# Patient Record
Sex: Female | Born: 1965
Health system: Southern US, Community
[De-identification: ages and names within clinical notes are randomized; demographics above are authoritative.]

## PROBLEM LIST (undated history)

## (undated) DIAGNOSIS — Z87442 Personal history of urinary calculi: Secondary | ICD-10-CM

## (undated) DIAGNOSIS — M1990 Unspecified osteoarthritis, unspecified site: Secondary | ICD-10-CM

## (undated) DIAGNOSIS — J189 Pneumonia, unspecified organism: Secondary | ICD-10-CM

## (undated) DIAGNOSIS — I4891 Unspecified atrial fibrillation: Secondary | ICD-10-CM

## (undated) DIAGNOSIS — F329 Major depressive disorder, single episode, unspecified: Secondary | ICD-10-CM

## (undated) DIAGNOSIS — R011 Cardiac murmur, unspecified: Principal | ICD-10-CM

## (undated) DIAGNOSIS — F419 Anxiety disorder, unspecified: Secondary | ICD-10-CM

## (undated) DIAGNOSIS — I10 Essential (primary) hypertension: Secondary | ICD-10-CM

## (undated) DIAGNOSIS — G4730 Sleep apnea, unspecified: Secondary | ICD-10-CM

## (undated) DIAGNOSIS — F32A Depression, unspecified: Secondary | ICD-10-CM

## (undated) DIAGNOSIS — R51 Headache: Secondary | ICD-10-CM

## (undated) DIAGNOSIS — Z6841 Body Mass Index (BMI) 40.0 and over, adult: Secondary | ICD-10-CM

## (undated) DIAGNOSIS — Z8719 Personal history of other diseases of the digestive system: Secondary | ICD-10-CM

## (undated) DIAGNOSIS — R519 Headache, unspecified: Secondary | ICD-10-CM

## (undated) DIAGNOSIS — D649 Anemia, unspecified: Secondary | ICD-10-CM

## (undated) HISTORY — DX: Body Mass Index (BMI) 40.0 and over, adult: Z684

## (undated) HISTORY — DX: Cardiac murmur, unspecified: R01.1

## (undated) HISTORY — PX: TONSILLECTOMY: SUR1361

## (undated) HISTORY — DX: Unspecified atrial fibrillation: I48.91

## (undated) HISTORY — DX: Morbid (severe) obesity due to excess calories: E66.01

## (undated) HISTORY — PX: CHOLECYSTECTOMY: SHX55

## (undated) HISTORY — DX: Essential (primary) hypertension: I10

## (undated) HISTORY — PX: APPENDECTOMY: SHX54

## (undated) HISTORY — DX: Pneumonia, unspecified organism: J18.9

---

## 1998-05-21 ENCOUNTER — Other Ambulatory Visit: Admission: RE | Admit: 1998-05-21 | Discharge: 1998-05-21 | Payer: Self-pay | Admitting: Obstetrics and Gynecology

## 1998-10-04 ENCOUNTER — Emergency Department (HOSPITAL_COMMUNITY): Admission: EM | Admit: 1998-10-04 | Discharge: 1998-10-04 | Payer: Self-pay | Admitting: Internal Medicine

## 1999-12-06 ENCOUNTER — Other Ambulatory Visit: Admission: RE | Admit: 1999-12-06 | Discharge: 1999-12-06 | Payer: Self-pay | Admitting: Gynecology

## 1999-12-06 ENCOUNTER — Encounter (INDEPENDENT_AMBULATORY_CARE_PROVIDER_SITE_OTHER): Payer: Self-pay | Admitting: Specialist

## 2002-03-27 ENCOUNTER — Other Ambulatory Visit: Admission: RE | Admit: 2002-03-27 | Discharge: 2002-03-27 | Payer: Self-pay | Admitting: Obstetrics and Gynecology

## 2002-04-07 ENCOUNTER — Encounter (INDEPENDENT_AMBULATORY_CARE_PROVIDER_SITE_OTHER): Payer: Self-pay | Admitting: Specialist

## 2002-04-07 ENCOUNTER — Ambulatory Visit (HOSPITAL_COMMUNITY): Admission: AD | Admit: 2002-04-07 | Discharge: 2002-04-07 | Payer: Self-pay | Admitting: Obstetrics and Gynecology

## 2002-04-07 ENCOUNTER — Encounter: Payer: Self-pay | Admitting: Obstetrics and Gynecology

## 2002-04-09 ENCOUNTER — Encounter: Payer: Self-pay | Admitting: Obstetrics and Gynecology

## 2002-04-09 ENCOUNTER — Observation Stay (HOSPITAL_COMMUNITY): Admission: AD | Admit: 2002-04-09 | Discharge: 2002-04-10 | Payer: Self-pay | Admitting: Obstetrics and Gynecology

## 2002-04-09 ENCOUNTER — Encounter (INDEPENDENT_AMBULATORY_CARE_PROVIDER_SITE_OTHER): Payer: Self-pay | Admitting: Specialist

## 2002-04-26 ENCOUNTER — Ambulatory Visit (HOSPITAL_COMMUNITY): Admission: RE | Admit: 2002-04-26 | Discharge: 2002-04-26 | Payer: Self-pay | Admitting: Family Medicine

## 2002-04-26 ENCOUNTER — Encounter: Payer: Self-pay | Admitting: Family Medicine

## 2002-05-22 ENCOUNTER — Encounter: Payer: Self-pay | Admitting: Surgery

## 2002-05-22 ENCOUNTER — Observation Stay (HOSPITAL_COMMUNITY): Admission: EM | Admit: 2002-05-22 | Discharge: 2002-05-23 | Payer: Self-pay | Admitting: Emergency Medicine

## 2002-05-22 ENCOUNTER — Encounter (INDEPENDENT_AMBULATORY_CARE_PROVIDER_SITE_OTHER): Payer: Self-pay | Admitting: Specialist

## 2003-01-22 ENCOUNTER — Ambulatory Visit (HOSPITAL_COMMUNITY): Admission: RE | Admit: 2003-01-22 | Discharge: 2003-01-22 | Payer: Self-pay | Admitting: Obstetrics and Gynecology

## 2003-02-17 ENCOUNTER — Ambulatory Visit (HOSPITAL_COMMUNITY): Admission: RE | Admit: 2003-02-17 | Discharge: 2003-02-17 | Payer: Self-pay | Admitting: Obstetrics and Gynecology

## 2003-02-17 ENCOUNTER — Encounter: Payer: Self-pay | Admitting: Obstetrics and Gynecology

## 2003-02-28 ENCOUNTER — Encounter: Payer: Self-pay | Admitting: Obstetrics and Gynecology

## 2003-02-28 ENCOUNTER — Inpatient Hospital Stay (HOSPITAL_COMMUNITY): Admission: AD | Admit: 2003-02-28 | Discharge: 2003-03-01 | Payer: Self-pay | Admitting: Obstetrics and Gynecology

## 2003-02-28 ENCOUNTER — Encounter (INDEPENDENT_AMBULATORY_CARE_PROVIDER_SITE_OTHER): Payer: Self-pay

## 2003-03-02 ENCOUNTER — Inpatient Hospital Stay (HOSPITAL_COMMUNITY): Admission: AD | Admit: 2003-03-02 | Discharge: 2003-03-02 | Payer: Self-pay | Admitting: Obstetrics and Gynecology

## 2003-03-13 ENCOUNTER — Encounter: Admission: RE | Admit: 2003-03-13 | Discharge: 2003-06-11 | Payer: Self-pay | Admitting: Obstetrics and Gynecology

## 2003-03-14 ENCOUNTER — Ambulatory Visit (HOSPITAL_COMMUNITY): Admission: RE | Admit: 2003-03-14 | Discharge: 2003-03-14 | Payer: Self-pay | Admitting: Obstetrics and Gynecology

## 2003-03-14 ENCOUNTER — Encounter: Payer: Self-pay | Admitting: Obstetrics and Gynecology

## 2003-04-17 ENCOUNTER — Ambulatory Visit (HOSPITAL_COMMUNITY): Admission: RE | Admit: 2003-04-17 | Discharge: 2003-04-17 | Payer: Self-pay | Admitting: Obstetrics and Gynecology

## 2003-04-17 ENCOUNTER — Encounter: Payer: Self-pay | Admitting: Obstetrics and Gynecology

## 2003-07-22 ENCOUNTER — Inpatient Hospital Stay (HOSPITAL_COMMUNITY): Admission: AD | Admit: 2003-07-22 | Discharge: 2003-07-22 | Payer: Self-pay | Admitting: Obstetrics and Gynecology

## 2003-08-04 ENCOUNTER — Inpatient Hospital Stay (HOSPITAL_COMMUNITY): Admission: AD | Admit: 2003-08-04 | Discharge: 2003-08-04 | Payer: Self-pay | Admitting: Obstetrics and Gynecology

## 2003-08-06 ENCOUNTER — Inpatient Hospital Stay (HOSPITAL_COMMUNITY): Admission: RE | Admit: 2003-08-06 | Discharge: 2003-08-09 | Payer: Self-pay | Admitting: Obstetrics and Gynecology

## 2003-08-06 ENCOUNTER — Encounter (INDEPENDENT_AMBULATORY_CARE_PROVIDER_SITE_OTHER): Payer: Self-pay | Admitting: Specialist

## 2004-03-30 ENCOUNTER — Other Ambulatory Visit: Admission: RE | Admit: 2004-03-30 | Discharge: 2004-03-30 | Payer: Self-pay | Admitting: Obstetrics and Gynecology

## 2005-04-05 ENCOUNTER — Other Ambulatory Visit: Admission: RE | Admit: 2005-04-05 | Discharge: 2005-04-05 | Payer: Self-pay | Admitting: Obstetrics and Gynecology

## 2006-04-11 ENCOUNTER — Other Ambulatory Visit: Admission: RE | Admit: 2006-04-11 | Discharge: 2006-04-11 | Payer: Self-pay | Admitting: Obstetrics and Gynecology

## 2006-05-16 ENCOUNTER — Ambulatory Visit (HOSPITAL_COMMUNITY): Admission: RE | Admit: 2006-05-16 | Discharge: 2006-05-16 | Payer: Self-pay | Admitting: Obstetrics and Gynecology

## 2006-05-30 ENCOUNTER — Encounter: Admission: RE | Admit: 2006-05-30 | Discharge: 2006-05-30 | Payer: Self-pay | Admitting: Obstetrics and Gynecology

## 2007-05-22 ENCOUNTER — Ambulatory Visit (HOSPITAL_COMMUNITY): Admission: RE | Admit: 2007-05-22 | Discharge: 2007-05-22 | Payer: Self-pay | Admitting: Obstetrics and Gynecology

## 2007-10-03 ENCOUNTER — Encounter: Admission: RE | Admit: 2007-10-03 | Discharge: 2007-10-03 | Payer: Self-pay | Admitting: Obstetrics and Gynecology

## 2008-05-22 ENCOUNTER — Encounter: Admission: RE | Admit: 2008-05-22 | Discharge: 2008-05-22 | Payer: Self-pay | Admitting: Obstetrics and Gynecology

## 2009-06-08 ENCOUNTER — Encounter: Admission: RE | Admit: 2009-06-08 | Discharge: 2009-06-08 | Payer: Self-pay | Admitting: Obstetrics and Gynecology

## 2010-06-28 ENCOUNTER — Encounter: Admission: RE | Admit: 2010-06-28 | Discharge: 2010-06-28 | Payer: Self-pay | Admitting: Obstetrics and Gynecology

## 2011-04-15 NOTE — H&P (Signed)
[facility]  PatientLING, FLESCH Visit Number: [id number] MRN: [id number]          Service Type: Attending:  Naima A. Normand Sloop, M.D. Dictated by:   Pierre Bali. Normand Sloop, M.D. Adm. Date:  04/09/02                           History and Physical  HISTORY OF PRESENT ILLNESS:   Ms. Verne is a 45 year old, G1, P0-0-0-1-0, who had a spontaneous abortion at 60 weeks on Apr 07, 2002.  She had a D&E for retained products, but despite D&E, she presented today complaining of heavy vaginal bleeding with passing clots since about 6 p.m.  Also had some dizziness.  Denied any chest pain or shortness of breath.  She said she has just been passing clots since that time.  PAST OBSTETRICAL HISTORY:     As above.  PAST MEDICAL HISTORY:         Significant for polycystic ovarian disease and depression.  PAST GYNECOLOGIC HISTORY:     She has no history of abnormal Pap smears or sexually transmitted diseases, and as above.  ALLERGIES:                    She is allergic to PENICILLIN.  MEDICATIONS:                  Motrin, Glucophage, and Prozac.  SOCIAL HISTORY:               Negative x 3.  PHYSICAL EXAMINATION:  VITAL SIGNS:                  Temperature 99.1, pulse ranges from 61 to 74, respiratory rate of 20.  Two blood pressures were taken which were 161/76 and 170/69.  GENERAL:                      The patient was in mild distress secondary to cramping.  HEART:                        Regular.  LUNGS:                        Clear.  ABDOMEN:                      Soft and nontender.  PELVIC:                       Vulva and vaginal exam is within normal limits. Cervix is 1 cm, dilated with retained products of the os, which was removed with ring forceps.  The uterus is about 10 weeks size, moderately tender with no adnexal masses.  EXTREMITIES:                  No clubbing, cyanosis, or edema.  LABORATORY DATA:              On ultrasound, the uterus  measures 12.67 by 6.97 by 6 cm.  Endometrial stripe at the largest area is 2.9 cm in thickness which was consistent with retained products or a blood clot.  White count is high at 24.1 and hemoglobin is 11.5.  Platelets are 358,000.  ASSESSMENT:  The patient is an incomplete abortion, status post dilatation and evacuation with still some retained products of conception.  After the products of conception were removed from the os, the bleeding slowed and the patients pain improved and cramping improved. Because a hemoglobin is stable and vital signs are stable, the patient was given the option of serial H&H with a pack count versus a repeat D&E under ultrasound guidance.  The patient chose observation with Methergine and Motrin.  She says if the bleeding becomes heavy again then we will proceed with the D&E.  I will recheck H&H q.4h., Start doxycycline, and monitor the patient closely.Dictated by:   Pierre Bali. Normand Sloop, M.D. Attending:  Naima A. Dillard, M.D. DD:  04/09/02 TD:  04/09/02 Job: [id number]

## 2011-04-15 NOTE — Op Note (Signed)
[facility]  PatientNIKKY, [name] Visit Number: [id number] MRN: [id number]          Service Type: MED Location: [phone] [id number] Attending Physician:  Jaymes Graff A Dictated by:   Pierre Bali Normand Sloop, M.D. Proc. Date: 04/07/02 Admit Date:  04/09/2002 Discharge Date: 04/10/2002                             Operative Report  PREOPERATIVE DIAGNOSIS:       Incomplete abortion at 13 weeks.  POSTOPERATIVE DIAGNOSIS:      Incomplete abortion at 13 weeks.  OPERATION:                    Dilation and evacuation.  SURGEON:                      Naima A. Normand Sloop, M.D.  ANESTHESIA:                   MAC.  ESTIMATED BLOOD LOSS:         450 cc.  IV FLUIDS:                    1200 cc of crystalloid.  FINDINGS:                     A 10 week size uterus, anteverted, and no                               adnexal masses palpated.  COMPLICATIONS:                None.  DISPOSITION:                  The patient went to the recovery room in                               good condition.  DESCRIPTION OF PROCEDURE:     The patient was taken to the operating room after giving informed consent, and she was placed in the dorsolithotomy position, and prepped and draped in a normal sterile fashion.  The patient was examined and noted to have the findings noted above.  A bivalve speculum was placed into the vagina.  The anterior lip of the cervix was grasped with a single tooth tenaculum.  The cervix was noted to be fingertip dilated and dilated a little farther with the [facility] dilators up to 25.  A size 8 suction curettage was then placed into the uterus until a gritty texture was noted and a scant amount of what appears to be products of conception were obtained. The sharp curet was then placed in the uterus until a gritty texture was noted.  The curet was then removed from the vagina and a gritty texture was noted.  The patient began to have heavy vaginal bleeding.   The curet was then placed back into the uterus, and again, a sharp gritty texture was noted on all walls of the uterus. At this time, the patient was then given uterine massage, Pitocin, and one dose of Methergine.  Estimated blood loss of 450 cc.  The patient did begin to slow down.  Bleeding began to slow down and then come to a halt.  The  patient will be sent home on Methergine and Motrin with bleeding precautions.  Her vital signs remained stable and she did well. All instruments were removed from the vagina.  Sponge, lap, and instrument counts were correct x2.  The patient went to the recovery room in stable condition. Dictated by:   Pierre Bali. Normand Sloop, M.D. Attending Physician:  Michael Litter DD:  04/07/02 TD:  04/09/02 Job: [id number]

## 2011-04-15 NOTE — Discharge Summary (Signed)
   [name], [name]                         ACCOUNT NO.:  [id number]   MEDICAL RECORD NO.:  [id number]                   PATIENT TYPE:  INP   LOCATION:  9114                                 FACILITY:  [facility]   PHYSICIAN:  Hal Morales, M.D.             DATE OF BIRTH:  [birth year]   DATE OF ADMISSION:  08/06/2003  DATE OF DISCHARGE:  08/09/2003                                 DISCHARGE SUMMARY   ADMISSION DIAGNOSES:  1. Intrauterine pregnancy at term.  2. Macrosomia.   DISCHARGE DIAGNOSES:  1. Intrauterine pregnancy at term status post cesarean delivery of a female     infant, Apgars 9 and 9, weighing 11 pounds 8 ounces.  2. Macrosomia.  3. Meconium-stained fluid.  4. True knot in the umbilical cord.   HOSPITAL PROCEDURES:  1. Spinal anesthesia.  2. Primary low transverse cesarean section.   HOSPITAL COURSE:  The patient was admitted for an elective primary low  transverse cesarean section due to fetal macrosomia.  This was performed  under spinal anesthesia by Naima A. Dillard, M.D. without incident.  Female  infant was delivered weighing 11 pounds 8 ounces, Apgars 9 and 9.  There was  moderate meconium-stained fluid noted and a true knot in the cord was noted  although the baby did very well.  Postop day #1 the patient was doing well,  breast feeding her baby and her vital signs were stable.  Hemoglobin was  9.3.  She continued her recovery over the next two days and did well.  Third  postop day she was ready to go home.  The pain was well controlled.  She was  breast feeding successfully.  Her vital signs were stable she was afebrile.  Chest was clear, heart rate was regular rate and rhythm, abdomen was soft  and appropriately tender.  Incision was clean, dry, and intact.  JP was  draining scant amount of serosanguineous fluid.  Lochia was small.  Extremities within normal limits and she was deemed to have received the  full benefit of her hospital stay and was  discharged home.   DISCHARGE MEDICATIONS:  1. Tylox one to two p.o. q.4 h. p.r.n.  2. Motrin 600 mg p.o. q.6 h. p.r.n.  3. Prenatal vitamins.   DISCHARGE LABORATORY DATA:  White blood cell count 7.1, hemoglobin 10.4,  hematocrit 29.7, platelet count 155.    DISCHARGE INSTRUCTIONS:  Per [facility] handout.   DISCHARGE FOLLOW UP:  In six weeks or p.r.n.     Marie L. Williams, C.N.M.                 Hal Morales, M.D.    MLW/MEDQ  D:  08/09/2003  T:  08/09/2003  Job:  [id number]

## 2011-04-15 NOTE — Discharge Summary (Signed)
[facility]  PatientCASSI, [name] Visit Number: [id number] MRN: [id number]          Service Type: MED Location: [phone] Attending Physician:  Jaymes Graff A Dictated by:   Mack Guise, C.N.M. Admit Date:  04/09/2002 Discharge Date: 04/10/2002                             Discharge Summary  ADMITTING DIAGNOSIS:          First trimester incomplete abortion, status post dilation and evacuation with retained products of conception.  PROCEDURE:                    Dilation and evacuation.  DISCHARGE DIAGNOSIS:          First trimester incomplete abortion, status post dilation and evacuation with retained products of conception.  HISTORY OF PRESENT ILLNESS:   [name] is a 45 year old gravida 1, para 0-0-1-0 status post spontaneous AB at 13 weeks with D&E on Apr 07, 2002 who presented with complaint of heavy vaginal bleeding and passing clots.  The uterus was 12.67 x 6.97 x 6 cm on ultrasound, endometrial stripe 2.97 at largest area of thickness, consistent with retained products of conception or blood clot.  Hemoglobin 11.5.  D&E performed by Dr. Jaymes Graff with products of conception removed from os.  The patient has been stable since procedure.  Hemoglobin today is 9.5.  Her abdomen is soft and nontender.  Her lochia is small and she is in stable condition and satisfactory for discharge.  DISCHARGE MEDICATIONS:        1. Motrin 600 mg p.o. q.6h. p.r.n. pain.                               2. Iron daily.  FOLLOWUP:                     The patient will return to [facility] in two weeks with followup with Dr. Normand Sloop. Dictated by:   Mack Guise, C.N.M. Attending Physician:  Michael Litter DD:  04/10/02 TD:  04/13/02 Job: [id number]

## 2011-04-15 NOTE — H&P (Signed)
NAME:  [name], [name]                         ACCOUNT NO.:  [id number]   MEDICAL RECORD NO.:  [id number]                   PATIENT TYPE:  AMB   LOCATION:  SDC                                  FACILITY:  [facility]   PHYSICIAN:  Naima A. Dillard, M.D.              DATE OF BIRTH:  Feb 01, 1966   DATE OF ADMISSION:  DATE OF DISCHARGE:                                HISTORY & PHYSICAL   CHIEF COMPLAINT:  Incompetent cervix.   HISTORY AND PHYSICAL:  The patient is a 45 year old gravida 2, para 0, 0, 1,  0 who is 11-1/7 weeks and who is presenting for cerclage secondary to a  history of a 13-week loss in May 2003.  The patient had a 13-week  miscarriage in which the back just appeared between her legs.  The patient  did not have any cramping or bleeding preceding the miscarriage.  The  patient had an ultrasound at six weeks of pregnancy, which demonstrated a  cervical length at 4.5 cm, intrauterine pregnancy, with fetal heart tones of  139 and no gross abnormalities.  She had a repeat ultrasound, which showed a  cervical length of 3.7 cm.  Because of the patient's history risks and  benefits of cerclage were reviewed, and the patient has opted for cerclage.   The patient's pregnancy is complicated by:  1. Advanced maternal age, she is undecided whether she will have an     amniocentesis.  2. Thirteen-week pregnancy loss; remnants were sent for chromosomes and they     were normal.  3. Polycystic ovarian syndrome.  The patient has lost 70 pounds using [employer] and is currently on Glucophage.  4. The patient has intramenstrual bleeding she is Rh positive and probably     due to cervical friability, but the patient also had a small subchorionic     hemorrhage seen on first trimester ultrasound.   PAST MEDICAL HISTORY:  Past medical history is significant for obsessive  compulsive disorder and PCOS.   PAST SURGICAL HISTORY:  Past surgical history is significant for:  1. Appendectomy  in 1989.  2. Tonsillectomy as a child.  3. Breast biopsy.  4. Wisdom teeth removal.  5. Cholecystectomy in June 2003.   PAST OBSTETRICAL HISTORY:  Past Ob history is significant for SAB at 13  weeks and she had a D&E in May 2003.   PAST GYNECOLOGICAL HISTORY:  The patient had menarche at age 61, occurring  every 80 and last 7 days.  No history of abnormal Pap smears or sexually  transmitted diseases.  The patient does have PCOS as above.   MEDICATIONS:  Meds include Glucophage, Prozac and prenatal vitamins.   ALLERGIES:  The patient is allergic to PENICILLIN.   FAMILY HISTORY:  Family history is significant for maternal grandfather who  had a myocardial infarction.  Both parents have chronic hypertension.  Maternal grandmother  has chronic hypertension.   GENETIC HISTORY:  Genetic history is unremarkable.  Father of the baby is  adopted.   SOCIAL HISTORY:  The social history is negative times three.   PHYSICAL EXAMINATION:  GENERAL:  On physical exam the patient is in no  apparent distress.  VITAL SIGNS:  Weight is 275 pounds.  Fetal heart tones are 160 on  ultrasound.  Blood pressure is 120/80.  HEART:  Regular rate and rhythm.  LUNGS:  Clear to auscultation bilaterally.  ABDOMEN:  Soft and nontender.  PELVIC EXAMINATION:  Cervix is closed and the measurements are 7 cm on  vaginal ultrasound.  EXTREMITIES:  The extremities have no cyanosis, clubbing or edema.   ASSESSMENT:  Presumed cervical incompetency.   PLAN:  A McDonald cerclage.   The patient understands that the risks are, but not limited to bleeding,  infection, damage to internal organs such as bladder and rectum, also  pregnancy and avulsion of the cervix.  The patient also understands that  cervical cerclage is very controversial in the Ob community and there are  some studies show that it does not help to maintain pregnancy; she still  desires to proceed with cervical cerclage.                                                 Naima A. Normand Sloop, M.D.    NAD/MEDQ  D:  01/21/2003  T:  01/22/2003  Job:  [id number]

## 2011-04-15 NOTE — Op Note (Signed)
NAME:  [name], [name]                         ACCOUNT NO.:  [id number]   MEDICAL RECORD NO.:  [id number]                   PATIENT TYPE:  INP   LOCATION:  9114                                 FACILITY:  [facility]   PHYSICIAN:  Naima A. Dillard, M.D.              DATE OF BIRTH:  [birth year]   DATE OF PROCEDURE:  08/06/2003  DATE OF DISCHARGE:                                 OPERATIVE REPORT   PREOPERATIVE DIAGNOSIS:  Intrauterine pregnancy at term with macrosomia.   POSTOPERATIVE DIAGNOSIS:  Intrauterine pregnancy at term with macrosomia.   PROCEDURE:  Primary low transverse cesarean section.   SURGEON:  Naima A. Normand Sloop, M.D.   ASSISTANT:  Rica Koyanagi, C.N.M.   ANESTHESIA:  Spinal.   ESTIMATED BLOOD LOSS:  900 mL.   URINE OUTPUT:  200 mL clear urine at the end of the procedure.   FLUIDS REPLACED:  3700 mL crystalloid.   COMPLICATIONS:  None.   FINDINGS:  A female infant in vertex presentation with Apgars of 9 and 9,  weight of 11 pounds 8 ounces, born at 12:54.  She had moderate meconium.  There was a true knot noted in the cord.  The patient had normal-appearing  uterus, tubes, and ovaries bilaterally.   PROCEDURE IN DETAIL:  The patient was taken to the operating room, where she  was given spinal anesthesia, placed in the dorsal supine position with a  left lateral tilt.  When anesthesia was found to be adequate, a Pfannenstiel  skin incision was then made with a scalpel, carried down to the fascia using  Bovie cautery.  The fascia was incised in the midline and extended  bilaterally using Bovie cautery.  Kochers x2 were placed on the superior  aspect of the fascia, which was dissected off the rectus muscle both sharply  and bluntly.  The inferior aspect of the fascia was dissected off the rectus  muscle in a similar fashion.  The rectus muscles were then separated in the  midline.  The peritoneum was identified, tented up, and entered sharply.  Metzenbaum scissors  were used to extend the peritoneum superiorly and  inferiorly with good visualization of bowel and bladder.  The vesicouterine  peritoneum was identified, entered sharply, and the bladder flap was created  both sharply and bluntly.  A bladder blade was inserted.  A primary lower  transverse uterine incision was then made with a scalpel.  The incision was  extended bilaterally using bandage scissors.  There was noted to be moderate  meconium.  The infant was delivered without difficulty, the cord was clamped  and cut, and handed off to awaiting pediatrician.  The placenta delivered  intact with moderate meconium staining and a true knot in the cord.  There  were some placental membranes adherent to the uterus, which had to be  curetted with a large curette.  The uterus was then cleared of all  clot and  debris.  The uterine incision was repaired with 0 Vicryl in a running locked  fashion.  A second layer of imbrication with 0 Vicryl was used to obtain  hemostasis.  The abdomen was irrigated.  Tubes and ovaries were noted to be  normal.  All areas were noted to be hemostatic.  The peritoneum was  reapproximated using 0 chromic, the fascia was closed using 0 Vicryl in a  running fashion.  A JP drain was placed in the subcutaneous layer after  hemostasis was obtained with Bovie cautery.  The subcutaneous tissue was  reapproximated using 2-0 plain.  The skin was closed with staples.  Sponge,  lap, and needle counts were correct x2.  The patient went to the recovery  room in stable condition.                                               Naima A. Normand Sloop, M.D.    NAD/MEDQ  D:  08/06/2003  T:  08/07/2003  Job:  [id number]

## 2011-04-15 NOTE — Op Note (Signed)
[facility]  Patient:    [name], [name] Visit Number: [id number] MRN: [id number]          Service Type: MED Location: [phone] Attending Physician:  Andre Lefort Dictated by:   Sandria Bales. Ezzard Standing, M.D. Proc. Date: 05/22/02 Admit Date:  05/22/2002 Discharge Date: 05/23/2002   CC:         Celso Amy, [facility] A. Eliezer Lofts., M.D.  Naima A. Normand Sloop, M.D.   Operative Report  PREOPERATIVE DIAGNOSES:  Acute cholecystitis, cholelithiasis.  POSTOPERATIVE DIAGNOSES:  Cholelithiasis without evidence of discreet acute disease.  PROCEDURE:  Laparoscopic cholecystectomy with intraoperative cholangiogram.  SURGEON:  Sandria Bales. Ezzard Standing, M.D.  FIRST ASSISTANT:  Zigmund Daniel, M.D.  ANESTHESIA:  General endotracheal.  ESTIMATED BLOOD LOSS:  Minimal.  INDICATIONS FOR PROCEDURE:  [name] is a 45 year old white female whose had recurrent bouts of epigastric right upper quadrant pain whose presented with a history of a fever, elevated liver function, elevated white blood count and documented gallstones on ultrasound and possibly acute cholecystitis. We talked about the options for elective surgery versus the procedure today and she elected to go ahead today and discuss both the indications and potential complications with the patient.  DESCRIPTION OF PROCEDURE:  The patient placed in the supine position. She was given 1 gm of Ancef at the initiation of the procedure. She had PAS stockings in place and oral gastric tube in place. Her abdomen was prepped with Betadine solution and sterilely draped.  An infraumbilical incision was made with sharp dissection and carried down to the abdominal cavity. A zero degree 10 mm laparoscope was inserted through a 12 mm Hasson trocar, the Hasson trocar was secured with a #0 Vicryl suture. Abdominal exploration carried out and revealed the right and left lobes of the liver appeared  normal. The stomach appeared normal and somewhat dilated. The gallbladder was seen in the right upper quadrant. The bowel which could be seen was all normal. She had some adhesions to the right lower quadrant consistent with her prior appendectomy but I saw no other mass, lesion or inflammatory changes within her abdomen. Three additional trocars were placed and a 10 mm Ethicon trocar in the subxiphoid location, a 5 mm Ethicon trocar in the right mid subcostal location, and 5 mm Ethicon trocar in the right lateral subcostal location.  The gallbladder was grasped, rotated cephalad and really had minimal inflammatory changes around it and certainly did not appear to be acutely inflamed. Dissection was carried out around the gallbladder cystic duct junction at the infundibulum. An intraoperative cholangiogram was obtained.  The intraoperative cholangiogram was obtained with a cut off taut catheter inserted through a 14 gauge Jelco catheter and the cut off taut catheter was inserted inside of the cut cystic duct. A clip had been placed on the gallbladder side of the cystic duct. The cystic duct had been milked back because there was some stone and debris in the cystic duct when I first got in there but was able to milk this back into the gallbladder. There was on debris after I cut the duct to place the catheter in. The intraoperative cholangiogram was obtained with the fluoroscopy using about 6-8 cc of 1/2 strength Hypaque solution which showed free flow down a cystic duct into the common bile duct into the duodenum. The common bile duct was moderately dilated but had no filling defect, no mass, no obstruction and was felt to be a normal intraoperative  cholangiogram and could be consistent with her passing maybe some small stone or debris.  The taut catheter was then cut and then removed. The cystic duct was triply endoclipped and divided. The gallbladder was then sharply and  bluntly dissected from the gallbladder bed. Prior to complete division of the gallbladder from the gallbladder, I revisualized the triangle of Calot, I revisualized the gallbladder bed, and saw no bleeding and no bile leak. The gallbladder was then divided, delivered through the umbilicus intact and sent to pathology. The abdomen was irrigated with about 500 cc of saline and there was no bleeding or bile leak. Each trocar was removed in turn, the umbilical trocar site was closed with a #0 Vicryl suture. The skin at each site was closed with a 5-0 Monocryl suture. Each site was infiltrated with a 0.25% Marcaine and painted with tinctured Benzoin and Steri-Strips. The patient tolerated the procedure well and was transported to the recovery room in good condition. Sponge and needle count were correct at the end of the case. Dictated by:   Sandria Bales. Ezzard Standing, M.D. Attending Physician:  Andre Lefort DD:  05/22/02 TD:  05/24/02 Job: [id number]

## 2011-04-15 NOTE — Discharge Summary (Signed)
   NAME:  [name], [name]                         ACCOUNT NO.:  [id number]   MEDICAL RECORD NO.:  [id number]                   PATIENT TYPE:  INP   LOCATION:  9159                                 FACILITY:  [facility]   PHYSICIAN:  Naima A. Dillard, M.D.              DATE OF BIRTH:  Sep 07, 1966   DATE OF ADMISSION:  02/28/2003  DATE OF DISCHARGE:  03/01/2003                                 DISCHARGE SUMMARY   ADMISSION DIAGNOSES:  1. Intrauterine pregnancy at 16-2/7 weeks.  2. History of incompetent cervix.  3. Loose Cerclage.   DISCHARGE DIAGNOSES:  1. Intrauterine pregnancy at 16-27 weeks.  2. History of incompetent cervix.  3. Loose Cerclage.  4. Removal of first loose Cerclage and replacement of Cerclage.  5. Anterior cervical nodule removed.   PROCEDURE THIS ADMISSION:  Repeat Cerclage.   HOSPITAL COURSE:  [name] is a 45 year old gravida 2, para 0, 0, 1, 0 at  16-2/7 weeks with a history of incompetent cervix and previous Cerclage who  presented complaining of feeling her Cerclage falling out.  She was noted  to have approximately a two to three inch fragment of her Cerclage hanging  down into the vagina with knot exposed and due to the looseness of this  Cerclage it was recommended that she have her Cerclage replaced.  She  underwent Cerclage replacement on 02/28/03 by Naima A. Dillard, M.D. and has  subsequent done well.  Her cramping has resolved, she has no bleeding and  has positive fetal heart tones.  She is deemed ready for discharge today and  is to be discharged with pelvic rest, level 2 bed rest and to call for any  bleeding or increased cramping.   DISCHARGE LABS:  Hemoglobin is 12.3, WBC count is 18,500 and platelets  282,000.   FOLLOW UP:  Discharge follow up will be as scheduled at [facility]  OB/GYN office or p.r.n.     [name], C.N.M.              Naima A. Normand Sloop, M.D.    SJD/MEDQ  D:  03/01/2003  T:  03/03/2003  Job:  [id number]

## 2011-04-15 NOTE — H&P (Signed)
NAME:  [name], [name]                         ACCOUNT NO.:  [id number]   MEDICAL RECORD NO.:  [id number]                   PATIENT TYPE:  MAT   LOCATION:  MATC                                 [facility]ILITY:  [facility]   PHYSICIAN:  Naima A. Dillard, M.D.              DATE OF BIRTH:  1966-08-29   DATE OF ADMISSION:  02/28/2003  DATE OF DISCHARGE:                                HISTORY & PHYSICAL   CHIEF COMPLAINT:  Incompetent cervix and failed first cerclage.   HISTORY OF PRESENT ILLNESS:  The patient is a 45 year old gravida 2 para 0-0-  1-0 who is now at 16 weeks and two days who presented to maternity  admissions stated that she felt part of her cervical cerclage at her vagina  earlier this a.m.  The patient has been seen in the office and her cerclage  was noted to be loose and causing some bleeding to the cervix and up near  the anterior vaginal wall.  The patient was counseled and she stated that  she wanted to try repeat cerclage.  Today the patient denies any cramping or  bleeding.  She does have a history of a 13-week loss in May 2003.  The  patient's cerclage was placed on January 22, 2003.  The patient had an  ultrasound today which showed a live intrauterine pregnancy in breech  presentation with normal fluid and cervix of 1.9 cm translabially with  funneling and pressure.  The patient was told that we could place her in  Trendelenburg and proceed with cerclage tomorrow, do the same and proceed  with cerclage later on this afternoon, just take out the current cerclage  and monitor.  The risks and benefits of all were reviewed with the patient  and the patient has decided to just wait a couple of hours and have cerclage  placed later on this afternoon.   The patient's pregnancy is complicated by:  1. Advanced maternal age; she does not want the amniocentesis.  2. A 13-week pregnancy loss; chromosomes were normal, and history of     incompetent cervix.  3. Polycystic ovarian  syndrome.  4. The patient has lost 70 pounds using [employer] and was on     Glucophage; has stopped during the first trimester.  5. The patient has had first trimester bleeding.  She is Rh positive.   PAST MEDICAL HISTORY:  Significant for obsessive-compulsive disorder, PCOS.   PAST SURGICAL HISTORY:  1. Appendectomy in 1989.  2. Tonsillectomy as a child.  3. Breast biopsy.  4. Wisdom teeth removal.  5. Cholecystectomy in June 2003.   PAST OBSTETRICAL HISTORY:  Significant for SAB at 13 weeks for which she had  a D&E in May 2003 that was complicated by retained products of conception  and she had a second D&E.   PAST GYNECOLOGICAL HISTORY:  The patient had menarche at age 12 occurring  every 67  days, lasting for seven days.  No history of abnormal Pap smears or  sexually transmitted diseases.  The patient does have PCOS as above.   MEDICATIONS:  Prozac and prenatal vitamins.   ALLERGIES:  PENICILLIN.   FAMILY HISTORY:  Significant for maternal grandfather who had myocardial  infarction.  Both parents have chronic hypertension.  Maternal grandmother  has chronic hypertension.   GENETIC HISTORY:  Unremarkable.  Father of the baby is adopted.   SOCIAL HISTORY:  Negative x3.   PHYSICAL EXAMINATION:  VITAL SIGNS:  Temperature 98.5, pulse 82, respiratory  rate 18, blood pressure anywhere from 144 to 158 systolic over 72 to 86  diastolic.  Fetal heart tones 160.  GENERAL:  The patient is in no apparent distress.  HEART:  Regular rate and rhythm.  NECK:  Free range of motion and supple.  LUNGS:  Clear to auscultation bilaterally.  ABDOMEN:  Obese, soft, and nontender.  PELVIC:  On speculum exam vulva and vaginal within normal limits.  Her  cervix appears closed on speculum exam.  Knot is actually in place.  The  cerclage actually did not appear to be coming out; however, will remove it  during surgery.  There is no bleeding, no lacerations, no lesions, and no  discharge.   On cervical exam, her internal os is closed; her external os is  a fingertip dilated.  EXTREMITIES:  No cyanosis, clubbing, or edema.   ASSESSMENT:  Incompetent cervix at 16 weeks.   PLAN:  Cervical cerclage.  The patient understands that the risks are but  not limited to bleeding, infection, rupture of membranes which could lead to  sepsis and loss of this pregnancy.  The patient understands that her options  are observation versus repeat cerclage and the patient has chosen to have  repeat cerclage.  She and her husband are aware of the risks.                                               Naima A. Normand Sloop, M.D.    NAD/MEDQ  D:  02/28/2003  T:  02/28/2003  Job:  [id number]

## 2011-04-15 NOTE — H&P (Signed)
[place]  Patient:    [name], [name] Visit Number: [id number] MRN: [id number]          Service Type: MED Location: [phone] Attending Physician:  Andre Lefort Dictated by:   Sandria Bales. Ezzard Standing, M.D. Proc. Date: 05/22/02 Admit Date:  05/22/2002 Discharge Date: 05/23/2002   CC:         Celso Amy, [place]  Molly Maduro A. Eliezer Lofts., M.D.  Dr. Normand Sloop, gynecologist   History and Physical  DATE OF BIRTH:  Mar 05, 1966  HISTORY OF PRESENT ILLNESS:  The patient is a 45 year old white female patient of Dr. Hinda Lenis, who has seen Celso Amy, PA, over the last couple of days. She was recently pregnant, had about 10 or 12 weeks of pregnancy before she had a miscarriage.  She was experiencing some vague abdominal pain and epigastric pain during that time.  She had a miscarriage approximately six weeks ago and had a D&C by Dr. Normand Sloop and was actually anemic some after the D&C.  Since the miscarriage, she has continued to have abdominal pain, particularly when she eats greasy foods.  Over the last week, she has had worsening of pain, located in her epigastrium and right upper quadrant where yesterday got so severe she saw Celso Amy, who obtained a complete metabolic panel which showed elevated liver functions and a CBC that showed an elevated white blood cell count.  She then asked me to see the patient.  [name] has no personal history of peptic ulcer disease, liver disease, pancreatic disease of colon disease.  She did have an appendectomy in 1989.  She has a strong family history of gallbladder disease in that both her mother and father had gallstones.  She has had no jaundice.  She thought she was febrile yesterday and she has had some nausea with this.  PAST MEDICAL HISTORY:  She is allergic to DARVOCET.  She thought she was allergic to PENICILLIN but has actually taken AMOXICILLIN since that time and has had no  problems.  CURRENT MEDICATIONS:  Include Glucophage, Prozac, iron.  I do not have the doses.  REVIEW OF SYSTEMS: Neurologic:  She suffers from obsessive-compulsive disorder and is followed by Dr. Haywood Lasso.  That is why she is on the Prozac.  She is doing well while she is on the Prozac.  Pulmonary:  She does not smoke cigarettes, no history of pneumonia, tuberculosis.  Cardiac:  She has had no heart disease, chest pain or hypertension.  Gastrointestinal:  See history of present illness.  Gyn:  See history of present illness.  She did have the miscarriage.  She has had at least one period about a week ago since having the miscarriage.  That was her first pregnancy.  She has no live births.  She is followed by Dr. Haywood Lasso from a gyn standpoint.  Urologic:  She has no history of kidney stones or kidney infections.  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  PHYSICAL EXAMINATION:  VITAL SIGNS:  Temperature is 98.7, blood pressure 136/82, pulse is 60, respiratory rate 18.  GENERAL:  She is a well-nourished, obese white female, alert and cooperative on phsical exam.  HEENT:  Unremarkable.  Her neck is supple without mass, without thyromegaly. BREAST:  Her breasts were symmetric.  She did have a duct removed from her right breast about eight to nine years ago by a Careers adviser in town, she can  not remember his name.  She has had no other breast problems.  HEART:  Regular rate and rhythm.  LUNGS:  Clear to auscultation.  ABDOMEN:  Some very minimal discomfort but she certainly has no guarding and no rebound.  No evidence of peritoneal signs.  I feel no evidence of hernia. She does have a well-healed right lower quadrant incision.  I did not do a rectal exam on her.  EXTREMITIES:  She has good strength in all four extremities and neurologically is grossly intact.  I have an ultrasound report dated May 30 which showed multiple  small gallstones, otherwise unremarkable ultrasound.  I have lab work from May 21, 2002 that shows a total bilirubin of 0.6, alkaline phosphatase of 266 with high normal 136, SGOT 67, SGPT 123.  Hemoglobin is 12.1, hematocrit 37.4, white blood cell count was 12,900.  I then repeated some of her labs today.  Urinalysis was unremarkable.  Her hgb today was 12.4, white blood cell count was down to 8,800.  Her amylase was 146, high normal being 131, lipase was 54, high normal being 51.  A chest x-ray showed some mild scoliosis.  IMPRESSION: 1.Recurrent biliary colic and patient has gotten where she is afraid to eat   which questionable acute cholecystitis though her white blood cell count   has returned to normal.  I talked to her about doing her electively at a   later time or go on and proceeding today.  Because of the discomfort that   she has had over the last week and on repeat presentation she is actually   going ahead with gallbladder surgery.  I discussed the indications of   surgery, potential complications including but not limited to infection,   bleeding, bowel injury, bile duct injury and open surgery.    I drew a diagram for her and spoke to her husband in addition to her.  I   will proceed with laparoscopic cholecystectomy tonight. Will also plan   cholangiogram because of elevated liver functions.  2. Polycystic ovarian disease treated on Glucophage. 3. Obsessive-compulsive disease, stable on Prozac. 4. Anemia which has resolved after recent miscarriage. 5. Recent miscarriage. Dictated by:   Sandria Bales. Ezzard Standing, M.D. Attending Physician:  Andre Lefort DD:  05/22/02 TD:  05/24/02 Job: [id number]

## 2011-04-15 NOTE — Op Note (Signed)
   NAME:  [name], [name]                         ACCOUNT NO.:  [id number]   MEDICAL RECORD NO.:  [id number]                   PATIENT TYPE:  AMB   LOCATION:  SDC                                  FACILITY:  [facility]   PHYSICIAN:  Naima A. Dillard, M.D.              DATE OF BIRTH:  14-Jun-1966   DATE OF PROCEDURE:  01/22/2003  DATE OF DISCHARGE:                                 OPERATIVE REPORT   PREOPERATIVE DIAGNOSIS:  Incompetent cervix.   POSTOPERATIVE DIAGNOSIS:  Incompetent cervix.   PROCEDURE:  McDonald cerclage.   ANESTHESIA:  Spinal.   SURGEON:  Naima A. Dillard, M.D.   FLUIDS REPLACED:  1200 mL crystalloid.   COMPLICATIONS:  None.   ESTIMATED BLOOD LOSS:  Minimal.   FINDINGS:  A 12 weeks' size uterus.  The cervix was long and closed.  McDonald cerclage was placed using Mersilene without difficulty.  The  patient tolerated the procedure well.   DESCRIPTION OF PROCEDURE:  The patient was taken to the operating room and  placed in the dorsal lithotomy position, prepped and draped in the normal  sterile fashion.  A weighted speculum was placed in the posterior fourchette  of the vagina.  The anterior and posterior lip of the cervix was grasped  with ringed forceps, and a Foley catheter was placed into the bladder.  McDonald cerclage was placed at the junction of the cervix internal os and  the junction of just where the bladder begins.  It was placed using proper  technique without difficulty.  The patient remained long and closed.  The  Foley catheter was noted.  The urine was noted to be clear.  Sponge, lap,  and needle counts were correct x2.  The patient went to the recovery room in  stable condition.                                               Naima A. Normand Sloop, M.D.    NAD/MEDQ  D:  01/22/2003  T:  01/22/2003  Job:  [id number]

## 2011-04-15 NOTE — H&P (Signed)
[facility]  Patient:    [name], [name] Visit Number: [id number] MRN: [id number]          Service Type: DSU Location: [facility] Attending Physician:  Jaymes Graff A Dictated by:   Pierre Bali Normand Sloop, M.D. Admit Date:  04/07/2002                           History and Physical  CHIEF COMPLAINT:              Passed a fetus at 9:30 this morning.  HISTORY OF PRESENT ILLNESS:   The patient is a 45 year old G1, P0 who is a patient of Dr. Maple Hudson for positive ovarian disease who was 13-1/[redacted] weeks pregnant.  She presented to the office on Wednesday with a normal ultrasound. She came in today saying something was hanging out of her legs and noticed that it was the fetus.  She was seen in the office on Wednesday for spotting, but it was felt to be secondary to cervical polyp that was removed.  She was currently at home on bedrest.  She denied having any cramping.  She did bleed quite a bit when she passed the fetus but had scant bleeding and presented to [facility]U.  MEDICATIONS:                  Glucophage, Prozac and prenatal vitamins.  ALLERGIES:                    She is allergic to PENICILLIN.  PAST GYNECOLOGIC HISTORY:     Significant for positive cyclical ovarian disease, cervical polyps and a history of fertility.  PAST MEDICAL HISTORY:         She has a past medical history significant for migraines.  PAST SURGICAL HISTORY:        She had an appendectomy, right breast duct revision and T&A done as a young child.  PHYSICAL EXAMINATION:  VITAL SIGNS:                  Blood pressure 155/70, temperature 98.6, pulse 95, respirations 20.  GENERAL:                      The patient is in no apparent distress.  HEENT:                        Within normal limits.  LUNGS:                        Clear to auscultation bilaterally.  ABDOMEN:                      Soft and nontender.  GENITALIA:                    External genitalia is within normal limits. Cervix  is a fingertip dilated with scant vaginal bleeding.  She has no adnexal tenderness or fullness.  The vagina is within normal limits.  The uterus is 10 weeks size.  LABORATORY DATA:              White count 21.1.  She is afebrile.  Hemoglobin is 12, and platelets are 307,000.  She is A positive, antibody negative.  The patient was a complete AB. Because of her cervical dilation we decided to get an ultrasound which was consistent  with retained products of conception. The uterus measured 10.9 x 8.2 x 8.8 x 3.4 cm straight.  Right ovary was normal but slightly enlarged because of the cyst.  It measured 3.8 x 1.6 x 1.6 with a cyst that was 1.7 x 1.3 x 1.1 cm.  The left ovary was within normal limits measuring 2.7 x 1.4 x 1.5 cm.  IMPRESSION AND PLAN:          Due to the findings consistent with products of conception the patient decided she wanted to have a D&C.  She consented knowing what the risks were but not limited to bleeding, infection, perforation of the uterus and Ashermans syndrome.  The patient also stated that she wanted chromosome study even with first miscarriage. Dictated by:   Pierre Bali. Normand Sloop, M.D. Attending Physician:  Michael Litter DD:  04/07/02 TD:  04/07/02 Job: [id number]

## 2011-04-15 NOTE — H&P (Signed)
NAME:  [name], [name]                         ACCOUNT NO.:  [id number]   MEDICAL RECORD NO.:  [id number]                   PATIENT TYPE:  MAT   LOCATION:  [facility]C                                 FACILITY:  [facility]   PHYSICIAN:  Naima A. Dillard, M.D.              DATE OF BIRTH:  [birth year]   DATE OF ADMISSION:  08/04/2003  DATE OF DISCHARGE:                                HISTORY & PHYSICAL   CHIEF COMPLAINT:  Macrosomia.   HISTORY OF PRESENT ILLNESS:  The patient is a 45 year old gravida 2, para 0,  at 51 weeks, presenting for primary low transverse cesarean section  secondary to macrosomia.  The patient was being evaluated for size greater  than dates and ultrasound on July 30, 2003, gave estimated weight of  4874-4934 grams, 10 pounds 12 ounces to 10 pounds 14 ounces.  The patient  was told at that time that the ultrasound could be abnormal, but because of  her obesity she is at risk for macrosomia and she would be at risk for  shoulder dystocia which could lead to permanent nerve damage, arm paralysis,  and even fetal and maternal death.  The patient was also told the risk of C-  section was, but not limited to, bleeding, infection, damage to internal  organs such as bowel and bladder, placenta previa, or mild placentation with  next pregnancy requiring a C-section or even possibly hysterectomy.  The  patient has decided to proceed with a cesarean section to avoid a possible  shoulder dystocia from macrosomia.  The patient also understands there is a  degree of error with ultrasound, but she still wanted to proceed.  The  patient has been a patient at [facility] since the first trimester of pregnancy.  Her pregnancy has been complicated by:  1. Advanced maternal age.  She declined amniocentesis.  2. She is obese.  She was polycystic ovarian syndrome and was on metformin     when she lost 75  pounds and conceived.  3. She has incompetent cervix.  During this pregnancy she had two  cerclages     actually placed, one at 12 weeks and another one at 16 weeks because the     12 week one came loose.  The patient then had it removed at 36 weeks.     She also has positive GBS bacteria.   ALLERGIES:  She is allergic to PENICILLIN.  GBS is sensitive to CLINDAMYCIN.   PAST MEDICAL HISTORY:  1. She also has pregnancy-induced hypertension, currently on labetalol 100     mg twice a day.  Her last 24 hour urine which was on July 30, 2003,     revealed 165 mg of protein.  Total volume was 2750 and creatinine     clearance was normal.  The patient has macrosomia but she has decided to     proceed with cesarean section.  2. Significant  for obsessive compulsive disorder.  3. Polycystic ovarian syndrome.   PAST OB HISTORY:  In May of 2003 the patient had a miscarriage that ensued  any cramping or bleeding, of a 13-week fetus.  She did have a D&C for  retained parts which was mainly the placenta.   PAST SURGICAL HISTORY:  1. Significant for appendectomy in 1989.  2. Tonsillectomy as a child.  3. Breast biopsy.  4. She had a wisdom tooth removed.  5. She had a cholecystectomy in June 2003.  6. She had cerclage x2 and a D&E back in May of 2003.   PAST GYN HISTORY:  She had menarche at age 73, occurring every 20 days  lasting for seven days.  No history of abnormal Pap smears or sexually  transmitted diseases.  She does have polycystic ovarian syndrome.   MEDICATIONS:  1. Prozac.  2. Prenatal vitamins.  3. Labetalol.   FAMILY HISTORY:  Significant for maternal grandfather, who has had  myocardial infarction.  Both parents have chronic hypertension.  Maternal  grandmother has chronic hypertension.   GENETIC HISTORY:  Unremarkable.  The father of the baby is adopted.   SOCIAL HISTORY:  Negative x3.  She denies any alcohol, drug, or illicit drug  use.  The patient and husband are both college educated and employed.   PHYSICAL EXAMINATION:  VITAL SIGNS:  The patient  weighs 338 pounds.  Blood  pressure is 128/74.  NECK:  Thyroid is not enlarged.  HEENT:  Head is normocephalic, atraumatic.  Pupils are equal, round, and  reactive to light.  HEART:  Has regular rate and rhythm.  LUNGS:  Clear to auscultation bilaterally.  ABDOMEN:  Obese, soft, and nontender.  Fundal height is 40 cm.  PELVIC:  Cervix: Full visual exam is within normal limits.  Cervix was found  to be fingertip to 1, 50%, and minus 3.  Membranes intact and vertex.  EXTREMITIES:  Have trace edema, 2+ deep tendon reflexes.   LABORATORY DATA:  Her fasting blood sugar on September 3 was 93, blood  pressures have been stable at 120s over 70s-80s.  Her ultrasound on  July 30, 2003 had estimated fetal weight of 10 pounds 14 ounces to 10  pounds 12 ounces with an AFI of 26.3.   PRENATAL LABS:  The patient is A-positive, PS 263, Rh antibody was negative,  RPR was nonreactive.  She is rubella immune.  Hepatitis B surface antigen  was negative.  HIV, gonorrhea, and Chlamydia were all deferred.  Pap was  within normal limits.  Glucose was 95.  Positive GBS bacteria.   ASSESSMENT:  Macrosomia and polyhydramnios presenting for a primary low  transverse cesarean section to avoid shoulder dystocia.  The patient  understands that the risks of C-section are, but not limited to, bleeding,  infection, damage to internal organs such as bowel and bladder, also wound  dehiscence and wound separation and infection secondary to obesity, also  possibility of abnormal placentation with next pregnancy which could be  problems with the pregnancy such as bleeding, hysterectomy, or for another  repeat cesarean section.  She also understands the risk of shoulder dystocia  are, but not limited to transient or permanent Erb or Klumpke palsy,  maternal and fetal death.  The patient was also given the option of a trial of labor, but if she should fall to the curve to proceed with C-section, the  patient has decided  to proceed with cesarean section.  She also has  a  pregnancy-induced hypertension.  Blood pressure is well-controlled on  labetalol, well monitored after delivery.                                                     Naima A. Normand Sloop, M.D.    NAD/MEDQ  D:  08/05/2003  T:  08/05/2003  Job:  [id number]

## 2011-04-15 NOTE — H&P (Signed)
NAME:  [name], [name]                         ACCOUNT NO.:  [id number]   MEDICAL RECORD NO.:  [id number]                   PATIENT TYPE:  AMB   LOCATION:  SDC                                  FACILITY:  [facility]   PHYSICIAN:  Naima A. Dillard, M.D.              DATE OF BIRTH:  [birth year]   DATE OF ADMISSION:  02/28/2003  DATE OF DISCHARGE:                                HISTORY & PHYSICAL   HISTORY OF PRESENT ILLNESS:  The patient is a 45 year old gravida 2, para 0-  0-1-0 at 16-2/7 weeks who presented with complaint of a fragment of her  Cerclage falling out.  This was noted when she wiped this morning. She is  scheduled for Cerclage replacement on March 01, 2003 with Dr. Normand Sloop.  Her  previous Cerclage was placed on January 22, 2003 but the patient had  bleeding last week and on evaluation the Cerclage was noted to be pulling  out.  The patient denies any cramping today or active bleeding.  She has no  recent intercourse.  Pregnancy has been remarkable for (1) history of  incompetent cervix with a 13 week loss in May of 2003, (2) obesity, (3)  History of PCOS, (4) advanced maternal age but no amnio, (5) history of  obsessive compulsive disorder on Prozac, (6) PENICILLIN ALLERGY.   LABORATORY DATA:  The patient's blood type is A positive.   OBSTETRICAL/GYNECOLOGICAL HISTORY:  In May of 2003 she had a spontaneous  miscarriage at 13 weeks at which time she woke up with passage of the fetus.  She then had a D&E at that time.  Chromosomes were normal on the products of  conception.  Her past GYN history:  Patient began her cycles at age 13,  every 28 days and lasting 7 days.  She has no history of abnormal Pap smears  or STD's.  The patient does have polycystic ovarian syndrome noted.   PAST MEDICAL HISTORY:  She has a history of PCOS for which she was on  Glucophage prior to pregnancy.  She was a previous oral contraceptive user.  She reports the usual childhood illnesses.  She had  removal of uterine  polyps in the past.  She has a history of occasional urinary tract  infections and as a child she did have pyelonephritis which was treated with  oral Penicillin.  She does have a history of OCD which is treated with  Prozac and she is treated by Dr. Meredith Staggers.   PAST SURGICAL HISTORY:  Includes appendectomy in 1989, tonsillectomy and  adenoidectomy as a child, an abnormal breast duct removed in 1993, her  wisdom teeth removed, cholecystectomy in June of 2003, a D and E in May of  2003.   ALLERGIES:  PENICILLIN which was noted as a child.   FAMILY HISTORY:  Maternal grandfather had an MI.  Both parents with chronic  hypertension.  Maternal grandmother  has chronic hypertension.  Genetic  history is unremarkable.  The father of the baby is adopted with no known  medical history in that way.   SOCIAL HISTORY:  The patient is married to the father of the  baby.  He is  involved and supportive.  His name is Thu Baggett.  The patient is college  educated.  She is employed in medical records.  Her husband is also college  educated; he is a Fish farm manager carrier.  She has been followed by the physician's  service at [facility].  She denies any alcohol, drug or tobacco use during  this pregnancy.   PHYSICAL EXAMINATION:  VITAL SIGNS:  Blood pressure 158/86, temperature  98.5, pulse 82, fetal heart tones are in the 160's.  ABDOMEN:  Soft and nontender.  Speculum examination reveals a 2 to 3 inch  fragment of Cerclage hanging down into the vagina with the knot exposed.  Thin vaginal discharge is noted.  Wet prep shows multiple WBC's.  Cervix  appears closed but does feel soft with two areas of friable tissue noted  with small amount of bright red bleeding on touching those areas.   ASSESSMENT:  1. Intrauterine pregnancy at 16-2/7 weeks.  2. Incompetent cervix.  3. Loose Cerclage.   PLAN:  1. Consulted with Dr. Pennie Rushing.  She will discuss the case with Dr. Normand Sloop.     It  is anticipated the patient will be admitted with possible Cerclage     today.  Dr. Normand Sloop will see the patient and determine if that is to be     the case.  2. The patient is aware of the plan and agreeable with it.     Renaldo Reel Emilee Hero, C.N.M.                   Naima A. Normand Sloop, M.D.    Leeanne Mannan  D:  02/28/2003  T:  02/28/2003  Job:  [id number]

## 2011-04-15 NOTE — Op Note (Signed)
   NAME:  [name], [name]                         ACCOUNT NO.:  [id number]   MEDICAL RECORD NO.:  [id number]                   PATIENT TYPE:  INP   LOCATION:  9159                                 FACILITY:  [facility]   PHYSICIAN:  Tamara A. Dillard, M.D.              DATE OF BIRTH:  [birth year]   DATE OF PROCEDURE:  02/28/2003  DATE OF DISCHARGE:  03/01/2003                                 OPERATIVE REPORT   PREOPERATIVE DIAGNOSIS:  Intrauterine pregnancy at 16-2/7 weeks, incompetent  cervix with failed first cerclage. The patient for repeat cerclage.   POSTOPERATIVE DIAGNOSIS:  Intrauterine pregnancy at 16-2/7 weeks,  incompetent cervix with failed first cerclage. The patient for repeat  cerclage.   PROCEDURE:  McDonald cerclage and cervical biopsy and removal of the 1st  McDonald cerclage.   SURGEON:  [name], M.D.   INTRAVENOUS FLUIDS:  1300 cc crystalloid.   ANESTHESIA:  Spinal.   URINE OUTPUT:  100 cc clear urine.   ESTIMATED BLOOD LOSS:  Minimal.   COMPLICATIONS:  None.   FINDINGS:  Cervix with the external os was ___________ as the first cerclage  is very loose and no evidence of pull through. The cervix after the second  cerclage was placed was noted to be closed 40% at the end of the procedure.  There was an anterior cervical nodule which was removed. Hemostasis was  achieved at the end of the procedure.   DESCRIPTION OF PROCEDURE:  The patient was taken to the operating room where  she was given spinal anesthesia and placed in the dorsal lithotomy position  and prepped and draped in a normal sterile fashion. A Foley catheter was  placed. A speculum was placed into the posterior fourchette of the vagina.  Skinny Deavers were used to retract the vaginal walls away from the cervix.  There was no foul discharge seen.   The cervix was grasped with 2 ring forceps and a McDonald cerclage was  placed starting on the anterior lip of the cervix in the normal fashion  and  tied down. The cervix was noted to be closed at 40% at the end of the  procedure. The previous cerclage was removed.   All instruments were removed from the vagina. The patient tolerated the  procedure well. Sponge, lap and needle counts were correct x2. The patient  went to the recovery room in stable condition.                                                     [name], M.D.    NAD/MEDQ  D:  02/28/2003  T:  03/01/2003  Job:  [id number]

## 2011-06-24 ENCOUNTER — Other Ambulatory Visit (HOSPITAL_COMMUNITY): Payer: Self-pay | Admitting: Obstetrics and Gynecology

## 2011-06-24 DIAGNOSIS — Z1231 Encounter for screening mammogram for malignant neoplasm of breast: Secondary | ICD-10-CM

## 2011-07-01 ENCOUNTER — Ambulatory Visit (HOSPITAL_COMMUNITY)
Admission: RE | Admit: 2011-07-01 | Discharge: 2011-07-01 | Disposition: A | Discharge status: Home / Self Care | Payer: Federal, State, Local not specified - PPO | Source: Ambulatory Visit | Attending: Obstetrics and Gynecology | Admitting: Obstetrics and Gynecology

## 2011-07-01 DIAGNOSIS — Z1231 Encounter for screening mammogram for malignant neoplasm of breast: Secondary | ICD-10-CM | POA: Insufficient documentation

## 2011-07-18 ENCOUNTER — Emergency Department (HOSPITAL_COMMUNITY): Payer: Federal, State, Local not specified - PPO

## 2011-07-18 ENCOUNTER — Inpatient Hospital Stay (HOSPITAL_COMMUNITY)
Admission: EM | Admit: 2011-07-18 | Discharge: 2011-07-22 | DRG: 541 | Disposition: A | Discharge status: Home / Self Care | Payer: Federal, State, Local not specified - PPO | Attending: Pulmonary Disease | Admitting: Pulmonary Disease

## 2011-07-18 DIAGNOSIS — J189 Pneumonia, unspecified organism: Secondary | ICD-10-CM | POA: Diagnosis present

## 2011-07-18 DIAGNOSIS — J9600 Acute respiratory failure, unspecified whether with hypoxia or hypercapnia: Secondary | ICD-10-CM

## 2011-07-18 DIAGNOSIS — R0989 Other specified symptoms and signs involving the circulatory and respiratory systems: Secondary | ICD-10-CM | POA: Diagnosis present

## 2011-07-18 DIAGNOSIS — D649 Anemia, unspecified: Secondary | ICD-10-CM | POA: Diagnosis present

## 2011-07-18 DIAGNOSIS — I10 Essential (primary) hypertension: Secondary | ICD-10-CM | POA: Diagnosis present

## 2011-07-18 HISTORY — DX: Pneumonia, unspecified organism: J18.9

## 2011-07-18 LAB — BASIC METABOLIC PANEL
CO2: 20 mEq/L (ref 19–32)
Calcium: 8.6 mg/dL (ref 8.4–10.5)
Chloride: 101 mEq/L (ref 96–112)
Creatinine, Ser: <0.47 mg/dL — ABNORMAL LOW (ref 0.50–1.10)
Potassium: 3.6 mEq/L (ref 3.5–5.1)
Sodium: 134 mEq/L — ABNORMAL LOW (ref 135–145)

## 2011-07-18 LAB — DIFFERENTIAL
Basophils Absolute: 0 10*3/uL (ref 0.0–0.1)
Basophils Relative: 0 % (ref 0–1)
Neutro Abs: 17 10*3/uL — ABNORMAL HIGH (ref 1.7–7.7)
Neutrophils Relative %: 86 % — ABNORMAL HIGH (ref 43–77)

## 2011-07-18 LAB — BLOOD GAS, ARTERIAL
Acid-Base Excess: 1.1 mmol/L (ref 0.0–2.0)
Drawn by: 24610
O2 Content: 3 L/min
pH, Arterial: 7.486 — ABNORMAL HIGH (ref 7.350–7.400)

## 2011-07-18 LAB — MRSA PCR SCREENING: MRSA by PCR: NEGATIVE

## 2011-07-18 LAB — HIV ANTIBODY (ROUTINE TESTING W REFLEX): HIV: NONREACTIVE

## 2011-07-18 LAB — CBC
MCHC: 30.6 g/dL (ref 30.0–36.0)
Platelets: 334 10*3/uL (ref 150–400)
RBC: 4.02 MIL/uL (ref 3.87–5.11)
RDW: 16.9 % — ABNORMAL HIGH (ref 11.5–15.5)
WBC: 19.7 10*3/uL — ABNORMAL HIGH (ref 4.0–10.5)

## 2011-07-19 ENCOUNTER — Inpatient Hospital Stay (HOSPITAL_COMMUNITY): Payer: Federal, State, Local not specified - PPO

## 2011-07-19 DIAGNOSIS — J9600 Acute respiratory failure, unspecified whether with hypoxia or hypercapnia: Secondary | ICD-10-CM

## 2011-07-19 DIAGNOSIS — J189 Pneumonia, unspecified organism: Secondary | ICD-10-CM

## 2011-07-19 LAB — BLOOD GAS, ARTERIAL
Acid-Base Excess: 1.6 mmol/L (ref 0.0–2.0)
O2 Content: 6 L/min
O2 Saturation: 95.3 %
TCO2: 22.1 mmol/L (ref 0–100)
pCO2 arterial: 31.2 mmHg — ABNORMAL LOW (ref 35.0–45.0)
pO2, Arterial: 68.4 mmHg — ABNORMAL LOW (ref 80.0–100.0)

## 2011-07-19 LAB — CBC
HCT: 30.1 % — ABNORMAL LOW (ref 36.0–46.0)
MCH: 22.9 pg — ABNORMAL LOW (ref 26.0–34.0)
MCV: 75.8 fL — ABNORMAL LOW (ref 78.0–100.0)
RBC: 3.97 MIL/uL (ref 3.87–5.11)
WBC: 18.5 10*3/uL — ABNORMAL HIGH (ref 4.0–10.5)

## 2011-07-19 LAB — BASIC METABOLIC PANEL
BUN: 7 mg/dL (ref 6–23)
CO2: 26 mEq/L (ref 19–32)
Calcium: 8.4 mg/dL (ref 8.4–10.5)
Chloride: 100 mEq/L (ref 96–112)
Creatinine, Ser: <0.47 mg/dL — ABNORMAL LOW (ref 0.50–1.10)
Glucose, Bld: 129 mg/dL — ABNORMAL HIGH (ref 70–99)

## 2011-07-20 ENCOUNTER — Inpatient Hospital Stay (HOSPITAL_COMMUNITY): Payer: Federal, State, Local not specified - PPO

## 2011-07-20 LAB — BASIC METABOLIC PANEL
BUN: 9 mg/dL (ref 6–23)
Creatinine, Ser: <0.47 mg/dL — ABNORMAL LOW (ref 0.50–1.10)
Glucose, Bld: 152 mg/dL — ABNORMAL HIGH (ref 70–99)
Potassium: 3.5 mEq/L (ref 3.5–5.1)

## 2011-07-20 LAB — LEGIONELLA ANTIGEN, URINE: Legionella Antigen, Urine: NEGATIVE

## 2011-07-20 LAB — CBC
HCT: 32.8 % — ABNORMAL LOW (ref 36.0–46.0)
Hemoglobin: 9.9 g/dL — ABNORMAL LOW (ref 12.0–15.0)
MCH: 22.9 pg — ABNORMAL LOW (ref 26.0–34.0)
MCHC: 30.2 g/dL (ref 30.0–36.0)
MCV: 75.9 fL — ABNORMAL LOW (ref 78.0–100.0)

## 2011-07-21 ENCOUNTER — Inpatient Hospital Stay (HOSPITAL_COMMUNITY): Payer: Federal, State, Local not specified - PPO

## 2011-07-21 DIAGNOSIS — J9600 Acute respiratory failure, unspecified whether with hypoxia or hypercapnia: Secondary | ICD-10-CM

## 2011-07-21 DIAGNOSIS — J189 Pneumonia, unspecified organism: Secondary | ICD-10-CM

## 2011-07-22 ENCOUNTER — Inpatient Hospital Stay (HOSPITAL_COMMUNITY): Payer: Federal, State, Local not specified - PPO

## 2011-07-22 LAB — BASIC METABOLIC PANEL
BUN: 15 mg/dL (ref 6–23)
CO2: 27 mEq/L (ref 19–32)
Chloride: 104 mEq/L (ref 96–112)
Potassium: 4 mEq/L (ref 3.5–5.1)

## 2011-07-22 LAB — PRO B NATRIURETIC PEPTIDE: Pro B Natriuretic peptide (BNP): 750.3 pg/mL — ABNORMAL HIGH (ref 0–125)

## 2011-07-25 LAB — CULTURE, BLOOD (ROUTINE X 2): Culture: NO GROWTH

## 2011-07-27 ENCOUNTER — Encounter: Payer: Self-pay | Admitting: Adult Health

## 2011-07-27 ENCOUNTER — Ambulatory Visit (INDEPENDENT_AMBULATORY_CARE_PROVIDER_SITE_OTHER): Payer: Federal, State, Local not specified - PPO | Admitting: Adult Health

## 2011-07-27 ENCOUNTER — Other Ambulatory Visit (INDEPENDENT_AMBULATORY_CARE_PROVIDER_SITE_OTHER): Payer: Federal, State, Local not specified - PPO

## 2011-07-27 DIAGNOSIS — J189 Pneumonia, unspecified organism: Secondary | ICD-10-CM

## 2011-07-27 DIAGNOSIS — J9690 Respiratory failure, unspecified, unspecified whether with hypoxia or hypercapnia: Secondary | ICD-10-CM

## 2011-07-27 DIAGNOSIS — J9600 Acute respiratory failure, unspecified whether with hypoxia or hypercapnia: Secondary | ICD-10-CM

## 2011-07-27 DIAGNOSIS — E669 Obesity, unspecified: Secondary | ICD-10-CM

## 2011-07-27 DIAGNOSIS — I10 Essential (primary) hypertension: Secondary | ICD-10-CM

## 2011-07-27 LAB — BASIC METABOLIC PANEL
Calcium: 8.9 mg/dL (ref 8.4–10.5)
GFR: 118.88 mL/min (ref >60.00)
Sodium: 144 mEq/L (ref 135–145)

## 2011-07-27 LAB — BRAIN NATRIURETIC PEPTIDE: Pro B Natriuretic peptide (BNP): 60 pg/mL (ref 0.0–100.0)

## 2011-07-27 LAB — SEDIMENTATION RATE: Sed Rate: 12 mm/h (ref 0–22)

## 2011-07-27 NOTE — Patient Instructions (Addendum)
Taper Prednisone 20mg  daily  For 5 days , then 10mg  daily for 5 days , then stop follow up Dr. Craige Cotta  In 2 weeks with chest xray.  I will call with labs.  Advance activity as tolerated.  Please contact office for sooner follow up if symptoms do not improve or worsen or seek emergency care

## 2011-07-28 ENCOUNTER — Telehealth: Payer: Self-pay | Admitting: Pulmonary Disease

## 2011-07-28 DIAGNOSIS — I10 Essential (primary) hypertension: Secondary | ICD-10-CM | POA: Insufficient documentation

## 2011-07-28 DIAGNOSIS — J9690 Respiratory failure, unspecified, unspecified whether with hypoxia or hypercapnia: Secondary | ICD-10-CM | POA: Insufficient documentation

## 2011-07-28 DIAGNOSIS — J189 Pneumonia, unspecified organism: Secondary | ICD-10-CM | POA: Insufficient documentation

## 2011-07-28 DIAGNOSIS — E669 Obesity, unspecified: Secondary | ICD-10-CM | POA: Insufficient documentation

## 2011-07-28 NOTE — Assessment & Plan Note (Addendum)
Clinically improving.  Will check labs with ESR and BNP  Will taper steroids slowly   Plan:   Taper Prednisone 20mg  daily  For 5 days , then 10mg  daily for 5 days , then stop follow up Dr. Craige Cotta  In 2 weeks with chest xray.  I will call with labs.  Advance activity as tolerated.  Please contact office for sooner follow up if symptoms do not improve or worsen or seek emergency care

## 2011-07-28 NOTE — Telephone Encounter (Signed)
Labs are much better -return to normal  Cont w/ ov recs  follow up Dr. Craige Cotta As planned   Spoke with pt and she is aware of lab results and verbalized understanding. Pt had no questions

## 2011-07-28 NOTE — Assessment & Plan Note (Addendum)
initial CCM/pulmonary consult during hospitalization 07/18/11 for Acute Resp Failure with presumed PNA. -no org identified.  Developed after  vacation [place] when she arrived back on home with progressive dyspnea, cough, wheezing. She was started on ABX In the OP setting. She continued to deterioate and presented withresp distress and hypoxemia. Xray showed bilateral infiltrates. ESR 67, BNP 750. HIV neg. Sepsis markers were neg. BC were neg. She was tx with IV abx, steroids and pulmonary hygiene. Discharged on Levaquin and prednisone at 40mg .  07/27/11 resolved , steroid tapered. No desaturations.

## 2011-07-28 NOTE — Progress Notes (Signed)
Reviewed, and agree with plan.

## 2011-07-28 NOTE — Progress Notes (Signed)
  Subjective:    Patient ID: [name], female    DOB: [birth year], 45 y.o.   MRN: [id number]  HPI 45 yo morbidly obese WF with medical hx of HTN was seen for initial CCM/pulmonary consult during hospitalization 07/18/11 for Acute Resp Failure with presumed PNA. -no org identified.   07/27/11 Post Hospital Pt presents for follow up for hospitalization. Pt says she was on vacation ([place]) when she arrived back on home with progressive dyspnea, cough, wheezing. She was started on ABX  In the OP setting. She continued to deterioate and presented with resp distress and hypoxemia. Xray showed bilateral infiltrates. ESR 67, BNP 750. HIV neg. Sepsis markers were neg. BC were neg. She was tx with IV abx, steroids and pulmonary hygiene. Discharged on Levaquin and prednisone at 40mg .   She returns today feeling much better. She has returned to work (works from home). Still tired and run down but cough and dyspnea are better. follow up cxr prior to discharge showed improved iniltrates. . No fever or edema. She denies dx of OSA . Does have some snoring and but no significant daytime hypersolomenence.   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   Review of Systems Constitutional:   No  weight loss, night sweats,  Fevers, chills,  +fatigue, or  lassitude.  HEENT:   No headaches,  Difficulty swallowing,  Tooth/dental problems, or  Sore throat,                No sneezing, itching, ear ache, nasal congestion, post nasal drip,   CV:  No chest pain,  Orthopnea, PND, swelling in lower extremities, anasarca, dizziness, palpitations, syncope.   GI  No heartburn, indigestion, abdominal pain, nausea, vomiting, diarrhea, change in bowel habits, loss of appetite, bloody stools.   Resp: No shortness of breath with exertion or at rest.  No excess mucus, no productive cough,  No non-productive cough,  No coughing up of blood.  No change  in color of mucus.  No wheezing.  No chest wall deformity  Skin: no rash or lesions.  GU: no dysuria, change in color of urine, no urgency or frequency.  No flank pain, no hematuria   MS:  No joint pain or swelling.  No decreased range of motion.  No back pain.  Psych:  No change in mood or affect. No depression or anxiety.  No memory loss.         Objective:   Physical Exam GEN: A/Ox3; pleasant , NAD, morbidly obese   HEENT:  [place]/AT,  EACs-clear, TMs-wnl, NOSE-clear drainage, THROAT-clear, no lesions, no postnasal drip or exudate noted. , thick tongue,   NECK:  Supple w/ fair ROM; no JVD; normal carotid impulses w/o bruits; no thyromegaly or nodules palpated; no lymphadenopathy.  RESP  Clear  P & A; w/o, wheezes/ rales/ or rhonchi.no accessory muscle use, no dullness to percussion  CARD:  RRR, no m/r/g  , tr  peripheral edema, pulses intact, no cyanosis or clubbing.  GI:   Soft & nt; nml bowel sounds; no organomegaly or masses detected, large panniculus   Musco: Warm bil, no deformities or joint swelling noted.   Neuro: alert, no focal deficits noted.    Skin: Warm, no lesions or rashes         Assessment & Plan:

## 2011-08-14 NOTE — Discharge Summary (Signed)
  NAMENEHA, WAIGHT               ACCOUNT NO.:  [id number]  MEDICAL RECORD NO.:  [id number]  LOCATION:  1510                         FACILITY:  [facility]  PHYSICIAN:  Coralyn Helling, MD        DATE OF BIRTH:  January 20, 1966  DATE OF ADMISSION:  07/18/2011 DATE OF DISCHARGE:  07/22/2011                              DISCHARGE SUMMARY   DISCHARGE DIAGNOSES:  Consist of: 1. Acute respiratory failure most likely multifactorial with presumed     pneumonia, no organism specified. 2. Hypertension. 3. Morbid obesity.  HISTORY OF PRESENT ILLNESS:  Ms. Bamford is a 45 year old female who just returned from [place] Cruise and developed dyspnea and wheezing, treated with Levaquin as an outpatient for pneumonia.  She had progressive dyspnea and hypoxemia, admitted on August 20, found to have bilateral infiltrates on chest x-ray.  Antibiotics consisted of Levaquin and vancomycin.  Culture data has been unremarkable.  FURTHER LABORATORY DATA:  ProBNP was 750, sodium 140, potassium 4.0, chloride 104, CO2 of 27, glucose 110, BUN is 15, and creatinine is 0.47. Legionella urine is negative.  Blood cultures are negative by preliminary.  Magnesium is 2.6.  HIV antibody is negative.  MRSA screen is negative.  Sed rate is 68.  Procalcitonin was less than 0.10.  Lactic acid was 1.4.  Arterial blood gas:  PH 7.48, pCO2 of 32, and pO2 of 68.  RADIOGRAPHIC DATA:  Chest x-ray shows clearing of bilateral infiltrates.  HOSPITAL COURSE BY DISCHARGE DIAGNOSIS: 1. Acute respiratory failure most likely multifactorial in the setting     of presumed pneumonia with no organism specified, pulmonary     vascular congestion, and morbid obesity.  She was switched over to     p.o. Levaquin.  She has completed a dose of vancomycin.  She will     be discharged with a continuation of two more days of Levaquin.     She would be on steroids, this will be slowly tapered off.  She     would be on 40 mg for 5 days then go to 20 mg  daily.  The patient     will be evaluated by Rubye Oaks for further weaning at that     time. 2. Hypertension, for which she is on treatment. 3. Morbid obesity, for which she needs weight loss.  DISCHARGE MEDICATIONS:  New medications are: 1. Levaquin 750 mg daily by mouth for 2 days. 2. Prednisone will be tapered 40 mg daily for 5 days and then 20 mg     daily until evaluated by Tammy Parrett. 3. Metoprolol 100 mg daily. 4. Prozac 20 mg one tablet daily.  DIET:  Heart-healthy low carb low-sodium diet.  DISPOSITION/CONDITION ON DISCHARGE:  Improved.  Note that this is a complex discharge summary taken greater than 45 minutes.     Devra Dopp, MSN, ACNP   ______________________________ Coralyn Helling, MD    SM/MEDQ  D:  07/22/2011  T:  07/22/2011  Job:  [id number]  Electronically Signed by Devra Dopp MSN ACNP on 08/09/2011 01:42:30 PM Electronically Signed by Coralyn Helling MD on 08/14/2011 11:14:34 AM

## 2011-08-18 ENCOUNTER — Ambulatory Visit: Payer: Federal, State, Local not specified - PPO | Admitting: Pulmonary Disease

## 2011-08-19 ENCOUNTER — Encounter: Payer: Self-pay | Admitting: Pulmonary Disease

## 2011-08-19 ENCOUNTER — Ambulatory Visit (INDEPENDENT_AMBULATORY_CARE_PROVIDER_SITE_OTHER)
Admission: RE | Admit: 2011-08-19 | Discharge: 2011-08-19 | Disposition: A | Discharge status: Home / Self Care | Payer: Federal, State, Local not specified - PPO | Source: Ambulatory Visit | Attending: Pulmonary Disease | Admitting: Pulmonary Disease

## 2011-08-19 ENCOUNTER — Ambulatory Visit (INDEPENDENT_AMBULATORY_CARE_PROVIDER_SITE_OTHER): Payer: Federal, State, Local not specified - PPO | Admitting: Pulmonary Disease

## 2011-08-19 VITALS — BP 142/82 | HR 83 | Temp 98.8°F | Ht 69.0 in | Wt >= 6400 oz

## 2011-08-19 DIAGNOSIS — J189 Pneumonia, unspecified organism: Secondary | ICD-10-CM

## 2011-08-19 DIAGNOSIS — J9690 Respiratory failure, unspecified, unspecified whether with hypoxia or hypercapnia: Secondary | ICD-10-CM

## 2011-08-19 DIAGNOSIS — J9600 Acute respiratory failure, unspecified whether with hypoxia or hypercapnia: Secondary | ICD-10-CM

## 2011-08-19 NOTE — Assessment & Plan Note (Signed)
Likely viral.  Resolved clinically and radiographically.  She can follow up with pulmonary as needed.

## 2011-08-19 NOTE — Assessment & Plan Note (Signed)
Resolved

## 2011-08-19 NOTE — Progress Notes (Signed)
  Subjective:    Patient ID: [name], female    DOB: 1966-07-12, 45 y.o.   MRN: [id number]  HPI  45 yo female for follow up of viral pneumonia.  She has been feeling much better.  She denies cough, fever, sweats, sputum, chest pain, sinus congestion, sore throat, or skin rash.  She does not feel her breathing limits her activity in anyway.  She has finished prednisone and has not noticed any worsening of her symptoms.  Past Medical History  Diagnosis Date  . Hypertension   . Pneumonia 07/18/2011    History   Social History  . Marital Status: Married   Social History Main Topics  . Smoking status: Never Smoker   . Smokeless tobacco: Never Used  . Alcohol Use: No  . Drug Use: No   Allergies  Allergen Reactions  . Darvocet (Propoxyphene N-Acetaminophen)   . Penicillins     Outpatient Prescriptions Prior to Visit  Medication Sig Dispense Refill  . drospirenone-ethinyl estradiol (YASMIN,[name],SYEDA) 3-0.03 MG tablet Take 1 tablet by mouth daily.        Marland Kitchen FLUoxetine (PROZAC) 20 MG capsule Takes 30 mg      . metoprolol (LOPRESSOR) 100 MG tablet Take 100 mg by mouth daily.        . predniSONE (DELTASONE) 20 MG tablet Take 20 mg by mouth daily.         Review of Systems     Objective:   Physical Exam  BP 142/82  Pulse 83  Temp 98.8 F (37.1 C)  Ht 5\' 9"  (1.753 m)  Wt 429 lb 12.8 oz (194.956 kg)  BMI 63.47 kg/m2  SpO2 99%  LMP 08/14/2011  General - Obese, healthy HEENT - no sinus tenderness, no oral exudate, no LAN Cardiac - s1s2 regular, no murmur Chest - good air entry, normal respiratory excursion, no wheeze/rales Abd - obese, soft, nontender Ext - no e/c/c Neuro - normal strength, CN intact Skin - no rashes Psych - normal mood/behavior  CHEST - 2 VIEW 08/19/11:  Comparison: 07/22/2011  Findings: The previous indistinct airspace opacities have resolved.  Reduced sensitivity of the lateral projection is due to motion artifact and body habitus.  No  pleural effusion noted.  Mild cardiomegaly is present.  IMPRESSION:  1. Mild cardiomegaly. 2. Resolution of the previous indistinct pulmonary opacities     Assessment & Plan:   Pneumonia Likely viral.  Resolved clinically and radiographically.  She can follow up with pulmonary as needed.  Respiratory failure Resolved.  Updated Medication List Outpatient Encounter Prescriptions as of 08/19/2011  Medication Sig Dispense Refill  . drospirenone-ethinyl estradiol (YASMIN,[name],SYEDA) 3-0.03 MG tablet Take 1 tablet by mouth daily.        Marland Kitchen FLUoxetine (PROZAC) 20 MG capsule Takes 30 mg      . metoprolol (LOPRESSOR) 100 MG tablet Take 100 mg by mouth daily.        Marland Kitchen DISCONTD: metoprolol (TOPROL-XL) 100 MG 24 hr tablet       . DISCONTD: predniSONE (DELTASONE) 20 MG tablet Take 20 mg by mouth daily.

## 2011-08-19 NOTE — Patient Instructions (Signed)
Follow up with pulmonary medicine as needed 

## 2011-10-07 ENCOUNTER — Other Ambulatory Visit: Payer: Self-pay | Admitting: Dermatology

## 2012-06-01 ENCOUNTER — Other Ambulatory Visit: Payer: Self-pay | Admitting: Obstetrics and Gynecology

## 2012-06-01 DIAGNOSIS — Z1231 Encounter for screening mammogram for malignant neoplasm of breast: Secondary | ICD-10-CM

## 2012-06-12 ENCOUNTER — Other Ambulatory Visit: Payer: Self-pay | Admitting: Obstetrics and Gynecology

## 2012-06-12 MED ORDER — DROSPIRENONE-ETHINYL ESTRADIOL 3-0.03 MG PO TABS: 1.0000 | ORAL_TABLET | Freq: Every day | ORAL | Status: DC

## 2012-06-12 NOTE — Telephone Encounter (Signed)
BC ok'd once with one refill.  Scheduled pt for 07-12-12.  ld

## 2012-06-12 NOTE — Telephone Encounter (Signed)
LAURA/bc

## 2012-06-15 ENCOUNTER — Other Ambulatory Visit: Payer: Self-pay | Admitting: Obstetrics and Gynecology

## 2012-06-18 ENCOUNTER — Other Ambulatory Visit: Payer: Self-pay

## 2012-06-28 ENCOUNTER — Ambulatory Visit (HOSPITAL_COMMUNITY)
Admission: RE | Admit: 2012-06-28 | Discharge: 2012-06-28 | Disposition: A | Discharge status: Home / Self Care | Payer: Federal, State, Local not specified - PPO | Source: Ambulatory Visit | Attending: Obstetrics and Gynecology | Admitting: Obstetrics and Gynecology

## 2012-06-28 DIAGNOSIS — Z1231 Encounter for screening mammogram for malignant neoplasm of breast: Secondary | ICD-10-CM

## 2012-07-12 ENCOUNTER — Encounter: Payer: Self-pay | Admitting: Obstetrics and Gynecology

## 2012-07-12 ENCOUNTER — Ambulatory Visit (INDEPENDENT_AMBULATORY_CARE_PROVIDER_SITE_OTHER): Payer: Federal, State, Local not specified - PPO | Admitting: Obstetrics and Gynecology

## 2012-07-12 VITALS — BP 122/68 | Ht 69.0 in | Wt >= 6400 oz

## 2012-07-12 DIAGNOSIS — Z01419 Encounter for gynecological examination (general) (routine) without abnormal findings: Secondary | ICD-10-CM

## 2012-07-12 DIAGNOSIS — N920 Excessive and frequent menstruation with regular cycle: Secondary | ICD-10-CM

## 2012-07-12 DIAGNOSIS — Z6841 Body Mass Index (BMI) 40.0 and over, adult: Secondary | ICD-10-CM | POA: Insufficient documentation

## 2012-07-12 MED ORDER — DESOGESTREL-ETHINYL ESTRADIOL 0.15-0.02/0.01 MG (21/5) PO TABS: 1.0000 | ORAL_TABLET | Freq: Every day | ORAL | Status: DC

## 2012-07-12 NOTE — Progress Notes (Signed)
The patient reports:normal menses, no abnormal bleeding, pelvic pain or discharge  Contraception:oral contraceptives (estrogen/progesterone) [name] (Yasmin) X many years  Last mammogram: was normal July 2013 Last pap: was normal July  2013  GC/Chlamydia cultures offered: declined HIV/RPR/HbsAg offered:  declined HSV 1 and 2 glycoprotein offered: declined  Menstrual cycle regular and monthly: Yes Menstrual flow normal: No: pt stated flow is heavy   Urinary symptoms: none Normal bowel movements: Yes Reports abuse at home: No  Subjective:    [name] is a 46 y.o. female, G2P1, who presents for an annual exam.     History   Social History  . Marital Status: Married    Spouse Name: N/A    Number of Children: N/A  . Years of Education: N/A   Social History Main Topics  . Smoking status: Never Smoker   . Smokeless tobacco: Never Used  . Alcohol Use: No  . Drug Use: No  . Sexually Active: Yes    Birth Control/ Protection: Pill   Other Topics Concern  . None   Social History Narrative  . None    Menstrual cycle:   LMP: Patient's last menstrual period was 06/16/2012.           Cycle: monthly with BCP, 7 days, 5 days heavy days with 1 pad every 2 hours, many clots 1-2 cm, new onset of cramping this summer, 1 episode of BTB with missed pill  The following portions of the patient's history were reviewed and updated as appropriate: allergies, current medications, past family history, past medical history, past social history, past surgical history and problem list.  Review of Systems Pertinent items are noted in HPI. Breast:Negative for breast lump,nipple discharge or nipple retraction Gastrointestinal: Negative for abdominal pain, change in bowel habits or rectal bleeding Urinary:negative   Objective:    BP 122/68  Ht 5\' 9"  (1.753 m)  Wt 418 lb (189.604 kg)  BMI 61.73 kg/m2  LMP 06/16/2012    Weight:  Wt Readings from Last 1 Encounters:  07/12/12 418 lb  (189.604 kg)          BMI: Body mass index is 61.73 kg/(m^2).  General Appearance: Alert, appropriate appearance for age. No acute distress HEENT: Grossly normal Neck / Thyroid: Supple, no masses, nodes or enlargement Lungs: clear to auscultation bilaterally Back: No CVA tenderness Breast Exam: No masses or nodes.No dimpling, nipple retraction or discharge. Cardiovascular: Regular rate and rhythm. S1, S2, no murmur Gastrointestinal: Soft, non-tender, no masses or organomegaly Pelvic Exam: Vulva and vagina appear normal. Bimanual exam reveals normal uterus and adnexa. Very limited by body habitus Rectovaginal: normal rectal, no masses Lymphatic Exam: Non-palpable nodes in neck, clavicular, axillary, or inguinal regions Skin: no rash or abnormalities Neurologic: Normal gait and speech, no tremor  Psychiatric: Alert and oriented, appropriate affect.    Assessment:    Normal gyn exam Menorrhagia despite BCP probabbly due to Hyperestrogenism secondary to obesity    Plan:    mammogram pap smear return annually or prn Pelvic ultrasound Change BCP to Azurette (20 mcg with desogestrel) STD screening: declined  Reviewed: Mirena, ablation    [facility] AMD

## 2012-07-13 LAB — PAP IG W/ RFLX HPV ASCU

## 2012-07-23 ENCOUNTER — Other Ambulatory Visit: Payer: Self-pay | Admitting: Obstetrics and Gynecology

## 2012-07-23 ENCOUNTER — Ambulatory Visit (INDEPENDENT_AMBULATORY_CARE_PROVIDER_SITE_OTHER): Payer: Federal, State, Local not specified - PPO | Admitting: Obstetrics and Gynecology

## 2012-07-23 ENCOUNTER — Ambulatory Visit (INDEPENDENT_AMBULATORY_CARE_PROVIDER_SITE_OTHER): Payer: Federal, State, Local not specified - PPO

## 2012-07-23 ENCOUNTER — Encounter: Payer: Self-pay | Admitting: Obstetrics and Gynecology

## 2012-07-23 VITALS — BP 124/76

## 2012-07-23 DIAGNOSIS — N920 Excessive and frequent menstruation with regular cycle: Secondary | ICD-10-CM

## 2012-07-23 NOTE — Progress Notes (Signed)
Subjective:    [name] is a 46 y.o. female, G2P1, who presents for Gyn ultrasound because of menorrhagia.  The following portions of the patient's history were reviewed and updated as appropriate: allergies, current medications, past family history.  Objective:    BP 124/76  LMP 07/16/2012    Weight:  Wt Readings from Last 1 Encounters:  07/12/12 418 lb (189.604 kg)          BMI: There is no height or weight on file to calculate BMI.  ULTRASOUND: Uterus normal    Adnexa normal    Endometrium 3.9 mm    Free fluid: no    Other findings:  none   Assessment:    Menorrhagia with normal ultrasound    Plan:    Proceed with change of BCP Follow-up 3 months

## 2012-08-20 ENCOUNTER — Telehealth: Payer: Self-pay | Admitting: Obstetrics and Gynecology

## 2012-08-21 NOTE — Telephone Encounter (Signed)
Explained to pt that it can take up to 3 cycles after changing bc to get cycle back to normal.  Pt agreeable.  Notified pt to call if nothing after 3 cycles.  ld

## 2012-08-28 ENCOUNTER — Telehealth: Payer: Self-pay | Admitting: Internal Medicine

## 2012-08-28 NOTE — Telephone Encounter (Signed)
S/W pt in re NP appt 10/02 @ 9:30 w/ Dr. Arbutus Ped Referring Dr. Zachery Dauer Dx-Anemia, Elevated WBC NP packet will be given to pt upon arrival.

## 2012-08-28 NOTE — Telephone Encounter (Signed)
C/D 08/28/12 for appt. 08/29/12 °

## 2012-08-29 ENCOUNTER — Other Ambulatory Visit: Payer: Federal, State, Local not specified - PPO | Admitting: Lab

## 2012-08-29 ENCOUNTER — Ambulatory Visit: Payer: Federal, State, Local not specified - PPO | Admitting: Internal Medicine

## 2012-08-29 ENCOUNTER — Ambulatory Visit: Payer: Federal, State, Local not specified - PPO

## 2012-09-04 ENCOUNTER — Telehealth: Payer: Self-pay | Admitting: Internal Medicine

## 2012-09-04 ENCOUNTER — Ambulatory Visit: Payer: Federal, State, Local not specified - PPO

## 2012-09-04 ENCOUNTER — Ambulatory Visit (HOSPITAL_BASED_OUTPATIENT_CLINIC_OR_DEPARTMENT_OTHER): Payer: Federal, State, Local not specified - PPO | Admitting: Internal Medicine

## 2012-09-04 ENCOUNTER — Other Ambulatory Visit (HOSPITAL_BASED_OUTPATIENT_CLINIC_OR_DEPARTMENT_OTHER): Payer: Federal, State, Local not specified - PPO | Admitting: Lab

## 2012-09-04 VITALS — BP 187/87 | HR 64 | Temp 97.0°F | Resp 20 | Ht 69.0 in | Wt >= 6400 oz

## 2012-09-04 DIAGNOSIS — D539 Nutritional anemia, unspecified: Secondary | ICD-10-CM

## 2012-09-04 DIAGNOSIS — D649 Anemia, unspecified: Secondary | ICD-10-CM

## 2012-09-04 LAB — CBC & DIFF AND RETIC
BASO%: 0.3 % (ref 0.0–2.0)
LYMPH%: 11.4 % — ABNORMAL LOW (ref 14.0–49.7)
MCHC: 30.1 g/dL — ABNORMAL LOW (ref 31.5–36.0)
MCV: 76.5 fL — ABNORMAL LOW (ref 79.5–101.0)
MONO%: 6 % (ref 0.0–14.0)
Platelets: 343 10*3/uL (ref 145–400)
RBC: 4.6 10*6/uL (ref 3.70–5.45)
WBC: 13.9 10*3/uL — ABNORMAL HIGH (ref 3.9–10.3)
nRBC: 0 % (ref 0–0)

## 2012-09-04 MED ORDER — INTEGRA PLUS PO CAPS: 1.0000 | ORAL_CAPSULE | Freq: Every morning | ORAL | Status: DC

## 2012-09-04 NOTE — Progress Notes (Signed)
[facility] Telephone:(336) [phone]   Fax:([phone]  CONSULT NOTE  REASON FOR CONSULTATION:  46 years old white female with persistent anemia.  HPI [name] is a 46 y.o. female was past medical history significant for hypertension, history of pneumonia, morbid obesity as well as vitamin D deficiency. The patient was seen a few weeks ago by her gynecologist for routine evaluation and physical examination she had CBC performed which showed elevated white blood count as well as persistent anemia. Repeat CBC a few weeks later showed no significant improvement in her condition. The patient was referred to me today for further evaluation and recommendation regarding her persistent anemia. Looking at her previous record I noticed that on 07/20/2011, the patient had elevated white blood count up to 15.0, hemoglobin 9.9, hematocrit 32.8% with MCV of 75.9 and platelets count were normal at 355,000. The patient mentions that recently she had heavy menstrual cycles at lost around 7 days sometimes with clots. Her gynecologist change of the oral contraceptive pills and recently had some blood spotting almost every other day. The patient denied having any other bleeding issues specifically she denied having any epistaxis, gum bleeding, hemoptysis, hematemesis or rectal bleeding.  She does not have any history of easy bruising. She was not in any time supplement until 2 weeks ago when she was started on ferrous sulfate 325 mg by mouth twice a day. The patient currently has no symptoms and specifically no fatigue, dizzy spells, chest pain or shortness of breath. She has no weight loss or night sweats.  Her family history significant for father with hypertension and diabetes mellitus, mother has anemia, hypertension and rheumatoid arthritis and a maternal grandmother with breast cancer. 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 She has no history of smoking, alcohol or drug abuse. @SFHPI @  Past Medical History  Diagnosis Date  . Hypertension   . Pneumonia 07/18/2011  . Morbid obesity with BMI of 60.0-69.9, adult     BMI 62    Past Surgical History  Procedure Date  . Tonsillectomy   . Cholecystectomy   . Appendectomy   . Cesarean section     Family History  Problem Relation Age of Onset  . Hypertension Mother   . Hypertension Father   . Cancer Father     prostate  . Hypertension Brother   . Diabetes Paternal Aunt   . Stroke Paternal Aunt   . Mental illness Paternal Aunt   . Diabetes Paternal Uncle   . Hypertension Maternal Grandmother   . Heart disease Maternal Grandfather   . Cancer Paternal Grandmother     liver    Social History History  Substance Use Topics  . Smoking status: Never Smoker   . Smokeless tobacco: Never Used  . Alcohol Use: No    Allergies  Allergen Reactions  . Darvocet (Propoxyphene-Acetaminophen)   . Penicillins     Current Outpatient Prescriptions  Medication Sig Dispense Refill  . desogestrel-ethinyl estradiol (AZURETTE) 0.15-0.02/0.01 MG (21/5) tablet Take 1 tablet by mouth daily.  1 Package  11  . ferrous sulfate 325 (65 FE) MG EC tablet Take 325 mg by mouth 3 (three) times daily with meals.      Marland Kitchen FLUoxetine (PROZAC) 20 MG capsule Takes 30 mg      . metoprolol (LOPRESSOR) 100 MG tablet Take 100 mg by mouth daily.        Marland Kitchen  FeFum-FePoly-FA-B Cmp-C-Biot (INTEGRA PLUS) CAPS Take 1 capsule by mouth every morning.  30 capsule  2    Review of Systems  A comprehensive review of systems was negative.  Physical Exam  WUJ:WJXBJ, healthy, no distress, well nourished and well developed SKIN: skin color, texture, turgor are normal HEAD: Normocephalic EYES: normal EARS: External ears normal OROPHARYNX:no exudate and no erythema  NECK: supple, no adenopathy LYMPH:  no palpable lymphadenopathy, no hepatosplenomegaly BREAST:not examined LUNGS: clear to  auscultation  HEART: regular rate & rhythm and no murmurs ABDOMEN:abdomen soft, non-tender, obese, normal bowel sounds and no masses or organomegaly BACK: Back symmetric, no curvature. EXTREMITIES:no edema, no skin discoloration, no clubbing  NEURO: alert & oriented x 3 with fluent speech, no focal motor/sensory deficits, gait normal  PERFORMANCE STATUS: ECOG 0  LABORATORY DATA: Lab Results  Component Value Date   WBC 13.9* 09/04/2012   HGB 10.6* 09/04/2012   HCT 35.2 09/04/2012   MCV 76.5* 09/04/2012   PLT 343 09/04/2012      Chemistry      Component Value Date/Time   NA 144 07/27/2011 1147   K 4.0 07/27/2011 1147   CL 103 07/27/2011 1147   CO2 27 07/27/2011 1147   BUN 18 07/27/2011 1147   CREATININE 0.6 07/27/2011 1147      Component Value Date/Time   CALCIUM 8.9 07/27/2011 1147       RADIOGRAPHIC STUDIES: No results found.  ASSESSMENT: This is a very pleasant 46 years old white female with a persistent anemia most likely secondary to iron deficiency from blood loss most likely secondary to her heavy menstruation. The patient has some improvement in her hemoglobin and hematocrit after starting oral iron tablets in the last 2 weeks.  PLAN: I have a lengthy discussion with the patient today about her condition. I ordered several studies today to evaluate her anemia including repeat CBC, comprehensive metabolic panel, LDH, iron study, ferritin, serum folate, serum B12 level, serum erythropoietin level as well as serum protein electrophoresis to rule out any other etiology for her anemia. I recommended for the patient to continue on oral iron tablet for now but I will it to Integra plus 1 capsule by mouth daily. She would come back for followup visit in 2 months for reevaluation and repeat iron study. If there is no improvement in her anemia and iron parameter with the oral iron tablets I may consider the patient for intravenous iron infusion.  The patient agreed to the current  plan. She knows to call me if she has any concerning symptoms in the interval.   All questions were answered. The patient knows to call the clinic with any problems, questions or concerns. We can certainly see the patient much sooner if necessary.  Thank you so much for allowing me to participate in the care of [name]. I will continue to follow up the patient with you and assist in her care.  I spent 30 minutes counseling the patient face to face. The total time spent in the appointment was 55 minutes.  Keyion Knack K. 09/04/2012, 10:45 AM

## 2012-09-04 NOTE — Telephone Encounter (Signed)
appts made and printed for pt aom °

## 2012-09-04 NOTE — Patient Instructions (Addendum)
You have time deficiency anemia most likely secondary to blood loss. I will start you on Integra plus 1 capsule by mouth daily for the next 2 months. Followup in 2 months with repeat CBC and iron study

## 2012-09-06 LAB — VITAMIN B12: Vitamin B-12: 403 pg/mL (ref 211–911)

## 2012-09-06 LAB — PROTEIN ELECTROPHORESIS, SERUM
Alpha-2-Globulin: 17.9 % — ABNORMAL HIGH (ref 7.1–11.8)
Beta Globulin: 8.1 % — ABNORMAL HIGH (ref 4.7–7.2)
Gamma Globulin: 9.4 % — ABNORMAL LOW (ref 11.1–18.8)

## 2012-09-06 LAB — IRON AND TIBC: UIBC: 398 ug/dL (ref 125–400)

## 2012-09-06 LAB — FERRITIN: Ferritin: 9 ng/mL — ABNORMAL LOW (ref 10–291)

## 2012-11-06 ENCOUNTER — Other Ambulatory Visit (HOSPITAL_BASED_OUTPATIENT_CLINIC_OR_DEPARTMENT_OTHER): Payer: Federal, State, Local not specified - PPO | Admitting: Lab

## 2012-11-06 DIAGNOSIS — D539 Nutritional anemia, unspecified: Secondary | ICD-10-CM

## 2012-11-06 DIAGNOSIS — D649 Anemia, unspecified: Secondary | ICD-10-CM

## 2012-11-06 LAB — CBC WITH DIFFERENTIAL/PLATELET
Basophils Absolute: 0 10*3/uL (ref 0.0–0.1)
EOS%: 1.1 % (ref 0.0–7.0)
Eosinophils Absolute: 0.2 10*3/uL (ref 0.0–0.5)
HCT: 34.9 % (ref 34.8–46.6)
HGB: 11 g/dL — ABNORMAL LOW (ref 11.6–15.9)
MCH: 25 pg — ABNORMAL LOW (ref 25.1–34.0)
MONO#: 0.8 10*3/uL (ref 0.1–0.9)
NEUT#: 12 10*3/uL — ABNORMAL HIGH (ref 1.5–6.5)
NEUT%: 80.7 % — ABNORMAL HIGH (ref 38.4–76.8)
RDW: 19.9 % — ABNORMAL HIGH (ref 11.2–14.5)
lymph#: 1.8 10*3/uL (ref 0.9–3.3)

## 2012-11-08 ENCOUNTER — Ambulatory Visit (HOSPITAL_BASED_OUTPATIENT_CLINIC_OR_DEPARTMENT_OTHER): Payer: Federal, State, Local not specified - PPO | Admitting: Internal Medicine

## 2012-11-08 ENCOUNTER — Telehealth: Payer: Self-pay | Admitting: Internal Medicine

## 2012-11-08 ENCOUNTER — Telehealth: Payer: Self-pay | Admitting: *Deleted

## 2012-11-08 VITALS — BP 182/81 | HR 81 | Temp 97.4°F | Resp 20 | Ht 69.0 in | Wt >= 6400 oz

## 2012-11-08 DIAGNOSIS — N920 Excessive and frequent menstruation with regular cycle: Secondary | ICD-10-CM

## 2012-11-08 DIAGNOSIS — D649 Anemia, unspecified: Secondary | ICD-10-CM

## 2012-11-08 DIAGNOSIS — D509 Iron deficiency anemia, unspecified: Secondary | ICD-10-CM

## 2012-11-08 NOTE — Progress Notes (Signed)
[facility] Telephone:(336) [phone]   Fax:(336) [phone]  OFFICE PROGRESS NOTE  Gaye Alken, MD [address] [place] [address]  DIAGNOSIS: Iron deficiency anemia.  PRIOR THERAPY: None  CURRENT THERAPY: Integra plus 1 capsule by mouth daily.    INTERVAL HISTORY: [name] 46 y.o. female returns to the clinic today for followup visit. The patient is feeling a little bit better today after she was started on oral iron tablets. She denied having any significant fatigue or weakness. She denied having any dizzy spells. The patient is tolerating her treatment with Integra plus fairly well. She had repeat CBC and iron study performed recently and she is here for evaluation and discussion of her lab results.  MEDICAL HISTORY: Past Medical History  Diagnosis Date  . Hypertension   . Pneumonia 07/18/2011  . Morbid obesity with BMI of 60.0-69.9, adult     BMI 62    ALLERGIES:  is allergic to darvocet and penicillins.  MEDICATIONS:  Current Outpatient Prescriptions  Medication Sig Dispense Refill  . desogestrel-ethinyl estradiol (AZURETTE) 0.15-0.02/0.01 MG (21/5) tablet Take 1 tablet by mouth daily.  1 Package  11  . FeFum-FePoly-FA-B Cmp-C-Biot (INTEGRA PLUS) CAPS Take 1 capsule by mouth every morning.  30 capsule  2  . ferrous sulfate 325 (65 FE) MG EC tablet Take 325 mg by mouth 3 (three) times daily with meals.      Marland Kitchen FLUoxetine (PROZAC) 20 MG capsule Takes 30 mg      . metoprolol (LOPRESSOR) 100 MG tablet Take 100 mg by mouth daily.          SURGICAL HISTORY:  Past Surgical History  Procedure Date  . Tonsillectomy   . Cholecystectomy   . Appendectomy   . Cesarean section     REVIEW OF SYSTEMS:  A comprehensive review of systems was negative.   PHYSICAL EXAMINATION: General appearance: alert, cooperative and no distress Head: Normocephalic, without obvious abnormality, atraumatic Neck: no adenopathy Resp: clear to  auscultation bilaterally Cardio: regular rate and rhythm, S1, S2 normal, no murmur, click, rub or gallop GI: soft, non-tender; bowel sounds normal; no masses,  no organomegaly Extremities: extremities normal, atraumatic, no cyanosis or edema  ECOG PERFORMANCE STATUS: 1 - Symptomatic but completely ambulatory  There were no vitals taken for this visit.  LABORATORY DATA: Lab Results  Component Value Date   WBC 14.9* 11/06/2012   HGB 11.0* 11/06/2012   HCT 34.9 11/06/2012   MCV 79.0* 11/06/2012   PLT 307 11/06/2012      Chemistry      Component Value Date/Time   NA 144 07/27/2011 1147   K 4.0 07/27/2011 1147   CL 103 07/27/2011 1147   CO2 27 07/27/2011 1147   BUN 18 07/27/2011 1147   CREATININE 0.6 07/27/2011 1147      Component Value Date/Time   CALCIUM 8.9 07/27/2011 1147       RADIOGRAPHIC STUDIES: No results found.  ASSESSMENT: This is a very pleasant 46 years old white female with history of iron deficiency anemia secondary to menorrhagia. The patient is doing a little bit better on the oral iron tablets but she still have significant anemia and iron deficiency.  PLAN: I discussed the lab result with the patient. I recommended for her to consider intravenous iron infusion in the form of Feraheme 510 mg intravenously weekly x2 doses. First dose expected tomorrow. I discussed with the patient adverse effect of this treatment and specifically  regarding anaphylactic allergic reaction. The patient agreed to proceed with treatment as planned. She would come back for followup visit in 2 months for reevaluation and repeat CBC and iron study. She will continue on oral iron tablets in the form of Integra plus 1 capsule by mouth daily  All questions were answered. The patient knows to call the clinic with any problems, questions or concerns. We can certainly see the patient much sooner if necessary.

## 2012-11-08 NOTE — Patient Instructions (Addendum)
There is improvement in your hemoglobin and hematocrit but not to the normal range. I will arrange for you to have Feraheme infusion x2 doses. Followup in 2 months with repeat anemia panel

## 2012-11-08 NOTE — Telephone Encounter (Signed)
Per staff message and POF I have scheduled appts.  JMW  

## 2012-11-08 NOTE — Telephone Encounter (Signed)
gv and printed appt schedule for pt for Dec and February 2014

## 2012-11-08 NOTE — Telephone Encounter (Signed)
Advised pt that the iron refill she needs...she needs to call her pharmacy and they will contact the Dr. Isidore Moos to refill.Marland KitchenMarland Kitchen

## 2012-11-09 ENCOUNTER — Ambulatory Visit (HOSPITAL_BASED_OUTPATIENT_CLINIC_OR_DEPARTMENT_OTHER): Payer: Federal, State, Local not specified - PPO

## 2012-11-09 VITALS — BP 155/69 | HR 67 | Temp 97.3°F | Resp 22

## 2012-11-09 DIAGNOSIS — D649 Anemia, unspecified: Secondary | ICD-10-CM

## 2012-11-09 DIAGNOSIS — D509 Iron deficiency anemia, unspecified: Secondary | ICD-10-CM

## 2012-11-09 MED ORDER — FERUMOXYTOL INJECTION 510 MG/17 ML
510.0000 mg | Freq: Once | INTRAVENOUS | Status: AC
  Administered 2012-11-09: 510 mg via INTRAVENOUS
  Filled 2012-11-09: qty 17

## 2012-11-09 MED ORDER — SODIUM CHLORIDE 0.9 % IV SOLN
Freq: Once | INTRAVENOUS | Status: AC
  Administered 2012-11-09: 09:00:00 via INTRAVENOUS

## 2012-11-09 NOTE — Patient Instructions (Addendum)
Ferumoxytol injection What is this medicine? FERUMOXYTOL is an iron complex. Iron is used to make healthy red blood cells, which carry oxygen and nutrients throughout the body. This medicine is used to treat iron deficiency anemia in people with chronic kidney disease. This medicine may be used for other purposes; ask your health care provider or pharmacist if you have questions. What should I tell my health care provider before I take this medicine? They need to know if you have any of these conditions: -anemia not caused by low iron levels -high levels of iron in the blood -magnetic resonance imaging (MRI) test scheduled -an unusual or allergic reaction to iron, other medicines, foods, dyes, or preservatives -pregnant or trying to get pregnant -breast-feeding How should I use this medicine? This medicine is for infusion into a vein. It is given by a health care professional in a hospital or clinic setting. Talk to your pediatrician regarding the use of this medicine in children. Special care may be needed. Overdosage: If you think you've taken too much of this medicine contact a poison control center or emergency room at once. Overdosage: If you think you have taken too much of this medicine contact a poison control center or emergency room at once. NOTE: This medicine is only for you. Do not share this medicine with others. What if I miss a dose? It is important not to miss your dose. Call your doctor or health care professional if you are unable to keep an appointment. What may interact with this medicine? This medicine may interact with the following medications: -other iron products This list may not describe all possible interactions. Give your health care provider a list of all the medicines, herbs, non-prescription drugs, or dietary supplements you use. Also tell them if you smoke, drink alcohol, or use illegal drugs. Some items may interact with your medicine. What should I watch  for while using this medicine? Visit your doctor or healthcare professional regularly. Tell your doctor or healthcare professional if your symptoms do not start to get better or if they get worse. You may need blood work done while you are taking this medicine. You may need to follow a special diet. Talk to your doctor. Foods that contain iron include: whole grains/cereals, dried fruits, beans, or peas, leafy green vegetables, and organ meats (liver, kidney). What side effects may I notice from receiving this medicine? Side effects that you should report to your doctor or health care professional as soon as possible: -allergic reactions like skin rash, itching or hives, swelling of the face, lips, or tongue -breathing problems -changes in blood pressure -feeling faint or lightheaded, falls -fever or chills -flushing, sweating, or hot feelings -swelling of the ankles or feet Side effects that usually do not require medical attention (Report these to your doctor or health care professional if they continue or are bothersome.): -diarrhea -headache -nausea, vomiting -stomach pain This list may not describe all possible side effects. Call your doctor for medical advice about side effects. You may report side effects to FDA at 1-800-FDA-1088. Where should I keep my medicine? This drug is given in a hospital or clinic and will not be stored at home. NOTE: This sheet is a summary. It may not cover all possible information. If you have questions about this medicine, talk to your doctor, pharmacist, or health care provider.  2012, Elsevier/Gold Standard. (08/06/2008 9:48:25 PM) 

## 2012-11-15 ENCOUNTER — Other Ambulatory Visit: Payer: Self-pay | Admitting: Oncology

## 2012-11-15 LAB — IRON AND TIBC
%SAT: 10 % — ABNORMAL LOW (ref 20–55)
TIBC: 374 ug/dL (ref 250–470)
UIBC: 338 ug/dL (ref 125–400)

## 2012-11-15 LAB — FERRITIN: Ferritin: 15 ng/mL (ref 10–291)

## 2012-11-15 LAB — LEUKOCYTE ALKALINE PHOSPHATASE: Leukocyte Alkaline Phos Stain: 158

## 2012-11-16 ENCOUNTER — Ambulatory Visit (HOSPITAL_BASED_OUTPATIENT_CLINIC_OR_DEPARTMENT_OTHER): Payer: Federal, State, Local not specified - PPO

## 2012-11-16 VITALS — BP 179/84 | HR 89 | Temp 98.7°F

## 2012-11-16 DIAGNOSIS — D649 Anemia, unspecified: Secondary | ICD-10-CM

## 2012-11-16 DIAGNOSIS — D509 Iron deficiency anemia, unspecified: Secondary | ICD-10-CM

## 2012-11-16 MED ORDER — SODIUM CHLORIDE 0.9 % IV SOLN
Freq: Once | INTRAVENOUS | Status: AC
  Administered 2012-11-16: 10:00:00 via INTRAVENOUS

## 2012-11-16 MED ORDER — FERUMOXYTOL INJECTION 510 MG/17 ML
510.0000 mg | Freq: Once | INTRAVENOUS | Status: AC
  Administered 2012-11-16: 510 mg via INTRAVENOUS
  Filled 2012-11-16: qty 17

## 2012-11-16 NOTE — Patient Instructions (Signed)
Ferumoxytol injection What is this medicine? FERUMOXYTOL is an iron complex. Iron is used to make healthy red blood cells, which carry oxygen and nutrients throughout the body. This medicine is used to treat iron deficiency anemia in people with chronic kidney disease. This medicine may be used for other purposes; ask your health care provider or pharmacist if you have questions. What should I tell my health care provider before I take this medicine? They need to know if you have any of these conditions: -anemia not caused by low iron levels -high levels of iron in the blood -magnetic resonance imaging (MRI) test scheduled -an unusual or allergic reaction to iron, other medicines, foods, dyes, or preservatives -pregnant or trying to get pregnant -breast-feeding How should I use this medicine? This medicine is for infusion into a vein. It is given by a health care professional in a hospital or clinic setting. Talk to your pediatrician regarding the use of this medicine in children. Special care may be needed. Overdosage: If you think you've taken too much of this medicine contact a poison control center or emergency room at once. Overdosage: If you think you have taken too much of this medicine contact a poison control center or emergency room at once. NOTE: This medicine is only for you. Do not share this medicine with others. What if I miss a dose? It is important not to miss your dose. Call your doctor or health care professional if you are unable to keep an appointment. What may interact with this medicine? This medicine may interact with the following medications: -other iron products This list may not describe all possible interactions. Give your health care provider a list of all the medicines, herbs, non-prescription drugs, or dietary supplements you use. Also tell them if you smoke, drink alcohol, or use illegal drugs. Some items may interact with your medicine. What should I watch  for while using this medicine? Visit your doctor or healthcare professional regularly. Tell your doctor or healthcare professional if your symptoms do not start to get better or if they get worse. You may need blood work done while you are taking this medicine. You may need to follow a special diet. Talk to your doctor. Foods that contain iron include: whole grains/cereals, dried fruits, beans, or peas, leafy green vegetables, and organ meats (liver, kidney). What side effects may I notice from receiving this medicine? Side effects that you should report to your doctor or health care professional as soon as possible: -allergic reactions like skin rash, itching or hives, swelling of the face, lips, or tongue -breathing problems -changes in blood pressure -feeling faint or lightheaded, falls -fever or chills -flushing, sweating, or hot feelings -swelling of the ankles or feet Side effects that usually do not require medical attention (Report these to your doctor or health care professional if they continue or are bothersome.): -diarrhea -headache -nausea, vomiting -stomach pain This list may not describe all possible side effects. Call your doctor for medical advice about side effects. You may report side effects to FDA at 1-800-FDA-1088. Where should I keep my medicine? This drug is given in a hospital or clinic and will not be stored at home. NOTE: This sheet is a summary. It may not cover all possible information. If you have questions about this medicine, talk to your doctor, pharmacist, or health care provider.  2012, Elsevier/Gold Standard. (08/06/2008 9:48:25 PM) 

## 2012-12-03 ENCOUNTER — Other Ambulatory Visit: Payer: Self-pay | Admitting: Internal Medicine

## 2013-01-02 ENCOUNTER — Other Ambulatory Visit: Payer: Self-pay | Admitting: Internal Medicine

## 2013-01-08 ENCOUNTER — Other Ambulatory Visit (HOSPITAL_BASED_OUTPATIENT_CLINIC_OR_DEPARTMENT_OTHER): Payer: Federal, State, Local not specified - PPO | Admitting: Lab

## 2013-01-08 DIAGNOSIS — D649 Anemia, unspecified: Secondary | ICD-10-CM

## 2013-01-08 LAB — IRON AND TIBC
%SAT: 16 % — ABNORMAL LOW (ref 20–55)
Iron: 52 ug/dL (ref 42–145)
TIBC: 326 ug/dL (ref 250–470)

## 2013-01-08 LAB — CBC WITH DIFFERENTIAL/PLATELET
BASO%: 0.2 % (ref 0.0–2.0)
Eosinophils Absolute: 0.2 10*3/uL (ref 0.0–0.5)
LYMPH%: 14.2 % (ref 14.0–49.7)
MCHC: 31.9 g/dL (ref 31.5–36.0)
MONO#: 0.8 10*3/uL (ref 0.1–0.9)
MONO%: 6 % (ref 0.0–14.0)
NEUT#: 10.3 10*3/uL — ABNORMAL HIGH (ref 1.5–6.5)
RBC: 4.58 10*6/uL (ref 3.70–5.45)
RDW: 18.7 % — ABNORMAL HIGH (ref 11.2–14.5)
WBC: 13.2 10*3/uL — ABNORMAL HIGH (ref 3.9–10.3)
nRBC: 0 % (ref 0–0)

## 2013-01-09 ENCOUNTER — Telehealth: Payer: Self-pay | Admitting: Internal Medicine

## 2013-01-09 ENCOUNTER — Ambulatory Visit (HOSPITAL_BASED_OUTPATIENT_CLINIC_OR_DEPARTMENT_OTHER): Payer: Federal, State, Local not specified - PPO | Admitting: Internal Medicine

## 2013-01-09 VITALS — BP 191/85 | HR 83 | Temp 97.8°F | Ht 69.0 in | Wt >= 6400 oz

## 2013-01-09 DIAGNOSIS — D509 Iron deficiency anemia, unspecified: Secondary | ICD-10-CM

## 2013-01-09 DIAGNOSIS — D649 Anemia, unspecified: Secondary | ICD-10-CM

## 2013-01-09 MED ORDER — INTEGRA PLUS PO CAPS: 1.0000 | ORAL_CAPSULE | Freq: Every morning | ORAL | Status: DC

## 2013-01-09 NOTE — Progress Notes (Signed)
[facility] Telephone:(336) [phone]   Fax:(336) [phone]  OFFICE PROGRESS NOTE  Gaye Alken, MD [address] [place] [address]  DIAGNOSIS: Iron deficiency anemia.   PRIOR THERAPY: Feraheme infusion 510 mg IV x2 doses last dose was given 11/08/2012.  CURRENT THERAPY: Integra plus 1 capsule by mouth daily.  INTERVAL HISTORY: [name] 47 y.o. female returns to the clinic today for followup visit accompanied by her daughter. The patient is feeling fine today with no specific complaints she notes significant improvement in her fatigue and weakness after the Feraheme infusion. The patient denied having any significant bleeding issues. She denied having any chest pain, shortness breath, cough or hemoptysis. She denied having any significant weight loss or night sweats. She had repeat CBC and iron study performed earlier today and she is here for evaluation and discussion of her lab results.  MEDICAL HISTORY: Past Medical History  Diagnosis Date  . Hypertension   . Pneumonia 07/18/2011  . Morbid obesity with BMI of 60.0-69.9, adult     BMI 62    ALLERGIES:  is allergic to darvocet and penicillins.  MEDICATIONS:  Current Outpatient Prescriptions  Medication Sig Dispense Refill  . desogestrel-ethinyl estradiol (AZURETTE) 0.15-0.02/0.01 MG (21/5) tablet Take 1 tablet by mouth daily.  1 Package  11  . FeFum-FePoly-FA-B Cmp-C-Biot (INTEGRA PLUS) CAPS TAKE 1 CAPSULE BY MOUTH EVERY MORNING  30 capsule  0  . FLUoxetine (PROZAC) 20 MG capsule Takes 30 mg      . metoprolol (LOPRESSOR) 100 MG tablet Take 100 mg by mouth daily.         No current facility-administered medications for this visit.    SURGICAL HISTORY:  Past Surgical History  Procedure Laterality Date  . Tonsillectomy    . Cholecystectomy    . Appendectomy    . Cesarean section      REVIEW OF SYSTEMS:  A comprehensive review of systems was negative.   PHYSICAL EXAMINATION:  General appearance: alert, cooperative and no distress Head: Normocephalic, without obvious abnormality, atraumatic Neck: no adenopathy Resp: clear to auscultation bilaterally Cardio: regular rate and rhythm, S1, S2 normal, no murmur, click, rub or gallop GI: soft, non-tender; bowel sounds normal; no masses,  no organomegaly Extremities: extremities normal, atraumatic, no cyanosis or edema  ECOG PERFORMANCE STATUS: 0 - Asymptomatic  Blood pressure 191/85, pulse 83, temperature 97.8 F (36.6 C), temperature source Oral, height 5\' 9"  (1.753 m), weight 426 lb 3.2 oz (193.323 kg).  LABORATORY DATA: Lab Results  Component Value Date   WBC 13.2* 01/08/2013   HGB 12.7 01/08/2013   HCT 39.8 01/08/2013   MCV 86.9 01/08/2013   PLT 301 01/08/2013      Chemistry      Component Value Date/Time   NA 144 07/27/2011 1147   K 4.0 07/27/2011 1147   CL 103 07/27/2011 1147   CO2 27 07/27/2011 1147   BUN 18 07/27/2011 1147   CREATININE 0.6 07/27/2011 1147      Component Value Date/Time   CALCIUM 8.9 07/27/2011 1147       RADIOGRAPHIC STUDIES: No results found.  ASSESSMENT: This is a very pleasant 47 years old white female with history of iron deficiency anemia status post a Feraheme infusion and currently on Integra plus on daily basis. She has significant improvement in her anemia as well as iron study on the recent blood work.   PLAN: I recommended for the patient to continue on her current  treatment with Integra plus. I would see her back for followup visit in 3 months with repeat CBC and iron study.  She was advised to call me immediately if she has any concerning symptoms in the interval.  All questions were answered. The patient knows to call the clinic with any problems, questions or concerns. We can certainly see the patient much sooner if necessary.

## 2013-01-09 NOTE — Patient Instructions (Signed)
Your anemia improved after the Feraheme infusion. Continue Integra plus 1 capsule by mouth daily. Followup in 3 months with repeat iron study.

## 2013-01-09 NOTE — Telephone Encounter (Signed)
gv and printed May appt schcdule

## 2013-01-09 NOTE — Addendum Note (Signed)
Addended by: Si Gaul on: 01/09/2013 10:22 AM   Modules accepted: Orders

## 2013-01-14 ENCOUNTER — Telehealth: Payer: Self-pay | Admitting: Obstetrics and Gynecology

## 2013-01-15 ENCOUNTER — Encounter: Payer: Self-pay | Admitting: Obstetrics and Gynecology

## 2013-01-15 ENCOUNTER — Ambulatory Visit: Payer: Federal, State, Local not specified - PPO | Admitting: Obstetrics and Gynecology

## 2013-01-15 VITALS — BP 124/80 | HR 78 | Wt >= 6400 oz

## 2013-01-15 DIAGNOSIS — Z309 Encounter for contraceptive management, unspecified: Secondary | ICD-10-CM

## 2013-01-15 DIAGNOSIS — N926 Irregular menstruation, unspecified: Secondary | ICD-10-CM

## 2013-01-15 DIAGNOSIS — N390 Urinary tract infection, site not specified: Secondary | ICD-10-CM

## 2013-01-15 DIAGNOSIS — N938 Other specified abnormal uterine and vaginal bleeding: Secondary | ICD-10-CM

## 2013-01-15 LAB — POCT URINALYSIS DIPSTICK
Bilirubin, UA: NEGATIVE
Ketones, UA: NEGATIVE

## 2013-01-15 NOTE — Progress Notes (Signed)
47 YO with history of menorrhagia with a normal ultrasound in August 2013.  Placed on birth control pills with regular periods until this month, third week she started bleeding with pad change every few hours with clots and cramps rated at a 4/10 on a 10 point pain scale.  Periods have lasted 7 days on these pills with pad change every 4-5 hours with no cramps. Denies missed/late pills, diarrhea/vomiting, change in bowel or bladder functions or new medications/supplements.  O: Pelvic: EGBUS- blood stained, vagina-moderate blood, cervix-no lesions, uterus-unable to assess due to habitus and patient discomfort, adnexae-no masses      or tenderness  UPT-negative U/A  A: DUB     H/O Anemia  P: Reviewed again options for management: Mirena IUD, ablation, hysterectomy or Lysteda      Patient considering ablation but will need contraception      Patient to take 2 pills daily until I can consult with Dr. Ellard Artis agreeable      RTO-as scheduled or prn  Morna Flud, PA-C

## 2013-01-15 NOTE — Progress Notes (Signed)
When did bleeding start: pt had a cycle x 2 for feb. How  Long: this month How often changing pad/tampon: 3 times a day Bleeding Disorders: no Cramping: yes Contraception: yes Fibroids: no Hormone Therapy: no New Medications: yes pt had a iron fusion in dec. Menopausal Symptoms: no Vag. Discharge: no Abdominal Pain: no Increased Stress: no

## 2013-01-15 NOTE — Patient Instructions (Addendum)
Subjective:   Endometrial Ablation Endometrial ablation removes the lining of the uterus (endometrium). It is usually a same day, outpatient treatment. Ablation helps avoid major surgery (such as a hysterectomy). A hysterectomy is removal of the cervix and uterus. Endometrial ablation has less risk and complications, has a shorter recovery period and is less expensive. After endometrial ablation, most women will have little or no menstrual bleeding. You may not keep your fertility. Pregnancy is no longer likely after this procedure but if you are pre-menopausal, you still need to use a reliable method of birth control following the procedure because pregnancy can occur. REASONS TO HAVE THE PROCEDURE MAY INCLUDE:  Heavy periods.  Bleeding that is causing anemia.  Anovulatory bleeding, very irregular, bleeding.  Bleeding submucous fibroids (on the lining inside the uterus) if they are smaller than 3 centimeters. REASONS NOT TO HAVE THE PROCEDURE MAY INCLUDE:  You wish to have more children.  You have a pre-cancerous or cancerous problem. The cause of any abnormal bleeding must be diagnosed before having the procedure.  You have pain coming from the uterus.  You have a submucus fibroid larger than 3 centimeters.  You recently had a baby.  You recently had an infection in the uterus.  You have a severe retro-flexed, tipped uterus and cannot insert the instrument to do the ablation.  You had a Cesarean section or deep major surgery on the uterus.  The inner cavity of the uterus is too large for the endometrial ablation instrument. RISKS AND COMPLICATIONS   Perforation of the uterus.  Bleeding.  Infection of the uterus, bladder or vagina.  Injury to surrounding organs.  Cutting the cervix.  An air bubble to the lung (air embolus).  Pregnancy following the procedure.  Failure of the procedure to help the problem requiring hysterectomy.  Decreased ability to diagnose cancer  in the lining of the uterus. BEFORE THE PROCEDURE  The lining of the uterus must be tested to make sure there is no pre-cancerous or cancer cells present.  Medications may be given to make the lining of the uterus thinner.  Ultrasound may be used to evaluate the size and look for abnormalities of the uterus.  Future pregnancy is not desired. PROCEDURE  There are different ways to destroy the lining of the uterus.   Resectoscope - radio frequency-alternating electric current is the most common one used.  Cryotherapy - freezing the lining of the uterus.  Heated Free Liquid - heated salt (saline) solution inserted into the uterus.  Microwave - uses high energy microwaves in the uterus.  Thermal Balloon - a catheter with a balloon tip is inserted into the uterus and filled with heated fluid. Your caregiver will talk with you about the method used in this clinic. They will also instruct you on the pros and cons of the procedure. Endometrial ablation is performed along with a procedure called operative hysteroscopy. A narrow viewing tube is inserted through the birth canal (vagina) and through the cervix into the uterus. A tiny camera attached to the viewing tube (hysteroscope) allows the uterine cavity to be shown on a TV monitor during surgery. Your uterus is filled with a harmless liquid to make the procedure easier. The lining of the uterus is then removed. The lining can also be removed with a resectoscope which allows your surgeon to cut away the lining of the uterus under direct vision. Usually, you will be able to go home within an hour after the procedure. HOME CARE INSTRUCTIONS  Do not drive for 24 hours.  No tampons, douching or intercourse for 2 weeks or until your caregiver approves.  Rest at home for 24 to 48 hours. You may then resume normal activities unless told differently by your caregiver.  Take your temperature two times a day for 4 days, and record it.  Take any  medications your caregiver has ordered, as directed.  Use some form of contraception if you are pre-menopausal and do not want to get pregnant. Bleeding after the procedure is normal. It varies from light spotting and mildly watery to bloody discharge for 4 to 6 weeks. You may also have mild cramping. Only take over-the-counter or prescription medicines for pain, discomfort, or fever as directed by your caregiver. Do not use aspirin, as this may aggravate bleeding. Frequent urination during the first 24 hours is normal. You will not know how effective your surgery is until at least 3 months after the surgery. SEEK IMMEDIATE MEDICAL CARE IF:   Bleeding is heavier than a normal menstrual cycle.  An oral temperature above 102 F (38.9 C) develops.  You have increasing cramps or pains not relieved with medication or develop belly (abdominal) pain which does not seem to be related to the same area of earlier cramping and pain.  You are light headed, weak or have fainting episodes.  You develop pain in the shoulder strap areas.  You have chest or leg pain.  You have abnormal vaginal discharge.  You have painful urination. Document Released: 09/23/2004 Document Revised: 02/06/2012 Document Reviewed: 12/22/2007 [facility] Patient Information 2013 [place], [place].

## 2013-01-17 ENCOUNTER — Telehealth: Payer: Self-pay | Admitting: Obstetrics and Gynecology

## 2013-01-17 NOTE — Telephone Encounter (Addendum)
Call to patient after consultation with [name], to offer Aygestin as a means of managing bleeding.  Patient was also given the option of staying on her current OCPs.  She opted for the latter as she has decided to proceed with endometrial ablation ASAP.  Advised that I would make [name] aware and proceed with arranging that procedure.  Khadejah Son, PA-C

## 2013-01-17 NOTE — Telephone Encounter (Signed)
Erroneous encounter

## 2013-01-18 ENCOUNTER — Telehealth: Payer: Self-pay | Admitting: Obstetrics and Gynecology

## 2013-01-18 ENCOUNTER — Telehealth: Payer: Self-pay

## 2013-01-18 MED ORDER — CIPROFLOXACIN HCL 250 MG PO TABS: 250.0000 mg | ORAL_TABLET | Freq: Two times a day (BID) | ORAL | Status: AC

## 2013-01-18 NOTE — Telephone Encounter (Signed)
Lm on vm tcb rgd labs 

## 2013-01-18 NOTE — Telephone Encounter (Signed)
Message copied by Rolla Plate on Fri Jan 18, 2013  1:58 PM ------      Message from: Winfred Leeds      Created: Fri Jan 18, 2013 11:24 AM      Regarding: positive urine culture                   ----- Message -----         From: Henreitta Leber, PA         Sent: 01/18/2013  10:28 AM           To: Winfred Leeds, CMA            Patient with a positive urine culture needs to be treated with Cipro 250  mg # 14  1 po bid x 7 days no refills. Thanks,  EP ------

## 2013-01-18 NOTE — Telephone Encounter (Signed)
Spoke with pt informed labs rx sent to pharm pt voice understanding

## 2013-03-15 ENCOUNTER — Telehealth: Payer: Self-pay | Admitting: Internal Medicine

## 2013-04-09 ENCOUNTER — Other Ambulatory Visit (HOSPITAL_BASED_OUTPATIENT_CLINIC_OR_DEPARTMENT_OTHER): Payer: Federal, State, Local not specified - PPO

## 2013-04-09 DIAGNOSIS — D649 Anemia, unspecified: Secondary | ICD-10-CM

## 2013-04-09 LAB — CBC WITH DIFFERENTIAL/PLATELET
BASO%: 0.3 % (ref 0.0–2.0)
Basophils Absolute: 0 10*3/uL (ref 0.0–0.1)
HCT: 37.4 % (ref 34.8–46.6)
HGB: 12.1 g/dL (ref 11.6–15.9)
MONO#: 0.7 10*3/uL (ref 0.1–0.9)
NEUT%: 79.2 % — ABNORMAL HIGH (ref 38.4–76.8)
WBC: 13.9 10*3/uL — ABNORMAL HIGH (ref 3.9–10.3)
lymph#: 1.9 10*3/uL (ref 0.9–3.3)

## 2013-04-09 LAB — IRON AND TIBC
%SAT: 10 % — ABNORMAL LOW (ref 20–55)
TIBC: 356 ug/dL (ref 250–470)

## 2013-04-10 ENCOUNTER — Ambulatory Visit: Payer: Federal, State, Local not specified - PPO | Admitting: Internal Medicine

## 2013-04-11 ENCOUNTER — Telehealth: Payer: Self-pay | Admitting: *Deleted

## 2013-04-11 ENCOUNTER — Telehealth: Payer: Self-pay | Admitting: Internal Medicine

## 2013-04-11 ENCOUNTER — Ambulatory Visit (HOSPITAL_BASED_OUTPATIENT_CLINIC_OR_DEPARTMENT_OTHER): Payer: Federal, State, Local not specified - PPO | Admitting: Internal Medicine

## 2013-04-11 ENCOUNTER — Encounter: Payer: Self-pay | Admitting: Internal Medicine

## 2013-04-11 VITALS — BP 167/77 | HR 74 | Temp 97.0°F | Resp 20 | Ht 69.0 in | Wt >= 6400 oz

## 2013-04-11 DIAGNOSIS — D649 Anemia, unspecified: Secondary | ICD-10-CM

## 2013-04-11 NOTE — Progress Notes (Signed)
[facility] [phone]   Fax:(336) [phone]  OFFICE PROGRESS NOTE  Gaye Alken, MD [address] [place] [address]  DIAGNOSIS: Iron deficiency anemia.   PRIOR THERAPY: Feraheme infusion 510 mg IV x2 doses last dose was given 11/08/2012.   CURRENT THERAPY: Integra plus 1 capsule by mouth daily.  INTERVAL HISTORY: [name] 47 y.o. female returns to the clinic today for followup visit. The patient is feeling okay today with no specific complaints except for occasional fatigue. She denied having any dizzy spells. She denied having any bleeding issues. She is currently on Integra plus and tolerating it fairly well. She has repeat CBC and iron study performed recently and she is here for evaluation and discussion of her lab results and recommendation regarding treatment of her condition.  MEDICAL HISTORY: Past Medical History  Diagnosis Date  . Hypertension   . Pneumonia 07/18/2011  . Morbid obesity with BMI of 60.0-69.9, adult     BMI 62    ALLERGIES:  is allergic to darvocet and penicillins.  MEDICATIONS:  Current Outpatient Prescriptions  Medication Sig Dispense Refill  . desogestrel-ethinyl estradiol (AZURETTE) 0.15-0.02/0.01 MG (21/5) tablet Take 1 tablet by mouth daily.  1 Package  11  . FeFum-FePoly-FA-B Cmp-C-Biot (INTEGRA PLUS) CAPS Take 1 capsule by mouth every morning.  30 capsule  3  . FLUoxetine (PROZAC) 20 MG capsule Takes 30 mg      . metoprolol (LOPRESSOR) 100 MG tablet Take 100 mg by mouth daily.         No current facility-administered medications for this visit.    SURGICAL HISTORY:  Past Surgical History  Procedure Laterality Date  . Tonsillectomy    . Cholecystectomy    . Appendectomy    . Cesarean section      REVIEW OF SYSTEMS:  A comprehensive review of systems was negative except for: Constitutional: positive for fatigue   PHYSICAL EXAMINATION: General appearance: alert, cooperative,  fatigued and no distress Head: Normocephalic, without obvious abnormality, atraumatic Neck: no adenopathy Lymph nodes: Cervical, supraclavicular, and axillary nodes normal. Resp: clear to auscultation bilaterally Cardio: regular rate and rhythm, S1, S2 normal, no murmur, click, rub or gallop GI: soft, non-tender; bowel sounds normal; no masses,  no organomegaly Extremities: extremities normal, atraumatic, no cyanosis or edema  ECOG PERFORMANCE STATUS: 1 - Symptomatic but completely ambulatory  Blood pressure 167/77, pulse 74, temperature 97 F (36.1 C), temperature source Oral, resp. rate 20, height 5\' 9"  (1.753 m), weight 437 lb (198.222 kg).  LABORATORY DATA: Lab Results  Component Value Date   WBC 13.9* 04/09/2013   HGB 12.1 04/09/2013   HCT 37.4 04/09/2013   MCV 85.7 04/09/2013   PLT 319 04/09/2013      Chemistry      Component Value Date/Time   NA 144 07/27/2011 1147   K 4.0 07/27/2011 1147   CL 103 07/27/2011 1147   CO2 27 07/27/2011 1147   BUN 18 07/27/2011 1147   CREATININE 0.6 07/27/2011 1147      Component Value Date/Time   CALCIUM 8.9 07/27/2011 1147       RADIOGRAPHIC STUDIES: No results found.  ASSESSMENT: this is a very pleasant 47 years old white female with history of iron deficiency anemia status post Feraheme infusion in the past and currently on treatment with Integra Plus 1 capsule by mouth daily   PLAN: her iron studies today showed low serum iron and declining ferritin level. I discussed  the lab result with the patient today. I recommended for her to consider Feraheme infusion again for 2 doses. She will also continue on the oral iron tablets. She would come back for follow up visit in 3 months with repeat CBC, iron study and ferritin. She was advised to call immediately if she has any concerning symptoms in the interval.  All questions were answered. The patient knows to call the clinic with any problems, questions or concerns. We can certainly see the  patient much sooner if necessary.

## 2013-04-11 NOTE — Patient Instructions (Signed)
Feraheme infusion in the next few days.  Follow up visit in 2 months with repeat CBC and iron study. 

## 2013-04-11 NOTE — Telephone Encounter (Signed)
Per staff message and POF I have scheduled appts.  JMW  

## 2013-04-12 ENCOUNTER — Ambulatory Visit (HOSPITAL_BASED_OUTPATIENT_CLINIC_OR_DEPARTMENT_OTHER): Payer: Federal, State, Local not specified - PPO

## 2013-04-12 VITALS — BP 154/76 | HR 66 | Temp 92.1°F | Resp 19

## 2013-04-12 DIAGNOSIS — D509 Iron deficiency anemia, unspecified: Secondary | ICD-10-CM

## 2013-04-12 DIAGNOSIS — D649 Anemia, unspecified: Secondary | ICD-10-CM

## 2013-04-12 MED ORDER — FERUMOXYTOL INJECTION 510 MG/17 ML
510.0000 mg | Freq: Once | INTRAVENOUS | Status: AC
  Administered 2013-04-12: 510 mg via INTRAVENOUS
  Filled 2013-04-12: qty 17

## 2013-04-12 MED ORDER — SODIUM CHLORIDE 0.9 % IV SOLN
Freq: Once | INTRAVENOUS | Status: AC
  Administered 2013-04-12: 10:00:00 via INTRAVENOUS

## 2013-04-12 NOTE — Patient Instructions (Signed)
Ferumoxytol injection What is this medicine? FERUMOXYTOL is an iron complex. Iron is used to make healthy red blood cells, which carry oxygen and nutrients throughout the body. This medicine is used to treat iron deficiency anemia in people with chronic kidney disease. This medicine may be used for other purposes; ask your health care provider or pharmacist if you have questions. What should I tell my health care provider before I take this medicine? They need to know if you have any of these conditions: -anemia not caused by low iron levels -high levels of iron in the blood -magnetic resonance imaging (MRI) test scheduled -an unusual or allergic reaction to iron, other medicines, foods, dyes, or preservatives -pregnant or trying to get pregnant -breast-feeding How should I use this medicine? This medicine is for infusion into a vein. It is given by a health care professional in a hospital or clinic setting. Talk to your pediatrician regarding the use of this medicine in children. Special care may be needed. Overdosage: If you think you've taken too much of this medicine contact a poison control center or emergency room at once. Overdosage: If you think you have taken too much of this medicine contact a poison control center or emergency room at once. NOTE: This medicine is only for you. Do not share this medicine with others. What if I miss a dose? It is important not to miss your dose. Call your doctor or health care professional if you are unable to keep an appointment. What may interact with this medicine? This medicine may interact with the following medications: -other iron products This list may not describe all possible interactions. Give your health care provider a list of all the medicines, herbs, non-prescription drugs, or dietary supplements you use. Also tell them if you smoke, drink alcohol, or use illegal drugs. Some items may interact with your medicine. What should I watch  for while using this medicine? Visit your doctor or healthcare professional regularly. Tell your doctor or healthcare professional if your symptoms do not start to get better or if they get worse. You may need blood work done while you are taking this medicine. You may need to follow a special diet. Talk to your doctor. Foods that contain iron include: whole grains/cereals, dried fruits, beans, or peas, leafy green vegetables, and organ meats (liver, kidney). What side effects may I notice from receiving this medicine? Side effects that you should report to your doctor or health care professional as soon as possible: -allergic reactions like skin rash, itching or hives, swelling of the face, lips, or tongue -breathing problems -changes in blood pressure -feeling faint or lightheaded, falls -fever or chills -flushing, sweating, or hot feelings -swelling of the ankles or feet Side effects that usually do not require medical attention (Report these to your doctor or health care professional if they continue or are bothersome.): -diarrhea -headache -nausea, vomiting -stomach pain This list may not describe all possible side effects. Call your doctor for medical advice about side effects. You may report side effects to FDA at 1-800-FDA-1088. Where should I keep my medicine? This drug is given in a hospital or clinic and will not be stored at home. NOTE: This sheet is a summary. It may not cover all possible information. If you have questions about this medicine, talk to your doctor, pharmacist, or health care provider.  2013, [employer]vier/[employer] Standard. (08/06/2008 9:48:25 PM)  

## 2013-04-19 ENCOUNTER — Ambulatory Visit (HOSPITAL_BASED_OUTPATIENT_CLINIC_OR_DEPARTMENT_OTHER): Payer: Federal, State, Local not specified - PPO

## 2013-04-19 VITALS — BP 167/80 | HR 63 | Temp 98.4°F

## 2013-04-19 DIAGNOSIS — D649 Anemia, unspecified: Secondary | ICD-10-CM

## 2013-04-19 DIAGNOSIS — D509 Iron deficiency anemia, unspecified: Secondary | ICD-10-CM

## 2013-04-19 MED ORDER — FERUMOXYTOL INJECTION 510 MG/17 ML
510.0000 mg | Freq: Once | INTRAVENOUS | Status: AC
  Administered 2013-04-19: 510 mg via INTRAVENOUS
  Filled 2013-04-19: qty 17

## 2013-04-19 MED ORDER — SODIUM CHLORIDE 0.9 % IV SOLN
Freq: Once | INTRAVENOUS | Status: AC
  Administered 2013-04-19: 11:00:00 via INTRAVENOUS

## 2013-04-19 NOTE — Patient Instructions (Addendum)
Ferumoxytol injection What is this medicine? FERUMOXYTOL is an iron complex. Iron is used to make healthy red blood cells, which carry oxygen and nutrients throughout the body. This medicine is used to treat iron deficiency anemia in people with chronic kidney disease. This medicine may be used for other purposes; ask your health care provider or pharmacist if you have questions. What should I tell my health care provider before I take this medicine? They need to know if you have any of these conditions: -anemia not caused by low iron levels -high levels of iron in the blood -magnetic resonance imaging (MRI) test scheduled -an unusual or allergic reaction to iron, other medicines, foods, dyes, or preservatives -pregnant or trying to get pregnant -breast-feeding How should I use this medicine? This medicine is for infusion into a vein. It is given by a health care professional in a hospital or clinic setting. Talk to your pediatrician regarding the use of this medicine in children. Special care may be needed. Overdosage: If you think you've taken too much of this medicine contact a poison control center or emergency room at once. Overdosage: If you think you have taken too much of this medicine contact a poison control center or emergency room at once. NOTE: This medicine is only for you. Do not share this medicine with others. What if I miss a dose? It is important not to miss your dose. Call your doctor or health care professional if you are unable to keep an appointment. What may interact with this medicine? This medicine may interact with the following medications: -other iron products This list may not describe all possible interactions. Give your health care provider a list of all the medicines, herbs, non-prescription drugs, or dietary supplements you use. Also tell them if you smoke, drink alcohol, or use illegal drugs. Some items may interact with your medicine. What should I watch  for while using this medicine? Visit your doctor or healthcare professional regularly. Tell your doctor or healthcare professional if your symptoms do not start to get better or if they get worse. You may need blood work done while you are taking this medicine. You may need to follow a special diet. Talk to your doctor. Foods that contain iron include: whole grains/cereals, dried fruits, beans, or peas, leafy green vegetables, and organ meats (liver, kidney). What side effects may I notice from receiving this medicine? Side effects that you should report to your doctor or health care professional as soon as possible: -allergic reactions like skin rash, itching or hives, swelling of the face, lips, or tongue -breathing problems -changes in blood pressure -feeling faint or lightheaded, falls -fever or chills -flushing, sweating, or hot feelings -swelling of the ankles or feet Side effects that usually do not require medical attention (Report these to your doctor or health care professional if they continue or are bothersome.): -diarrhea -headache -nausea, vomiting -stomach pain This list may not describe all possible side effects. Call your doctor for medical advice about side effects. You may report side effects to FDA at 1-800-FDA-1088. Where should I keep my medicine? This drug is given in a hospital or clinic and will not be stored at home. NOTE: This sheet is a summary. It may not cover all possible information. If you have questions about this medicine, talk to your doctor, pharmacist, or health care provider.  2012, Elsevier/Gold Standard. (08/06/2008 9:48:25 PM) 

## 2013-05-10 ENCOUNTER — Telehealth: Payer: Self-pay | Admitting: Medical Oncology

## 2013-05-10 NOTE — Telephone Encounter (Signed)
Her pharmacy cannot get integra plus . Pt does not want to get it from any other pharmacy. Will Dr Arbutus Ped recommend an alternative form of iron. Note sent to Dr Arbutus Ped.

## 2013-05-15 ENCOUNTER — Telehealth: Payer: Self-pay | Admitting: *Deleted

## 2013-05-15 DIAGNOSIS — D649 Anemia, unspecified: Secondary | ICD-10-CM

## 2013-05-15 MED ORDER — POLYSACCHARIDE IRON COMPLEX 150 MG PO CAPS: 150.0000 mg | ORAL_CAPSULE | Freq: Every day | ORAL | Status: DC

## 2013-05-15 NOTE — Telephone Encounter (Signed)
[facility] pharmacy gave recommendation to an alternative for the integra plus.  Alternative to the integra plus called into the pharmacy.  Pharmacy will call patient to inform her it is ready for pick-up.  SLJ

## 2013-06-04 ENCOUNTER — Other Ambulatory Visit: Payer: Federal, State, Local not specified - PPO

## 2013-06-04 ENCOUNTER — Telehealth: Payer: Self-pay | Admitting: Internal Medicine

## 2013-06-04 ENCOUNTER — Other Ambulatory Visit (HOSPITAL_BASED_OUTPATIENT_CLINIC_OR_DEPARTMENT_OTHER): Payer: Federal, State, Local not specified - PPO

## 2013-06-04 DIAGNOSIS — D649 Anemia, unspecified: Secondary | ICD-10-CM

## 2013-06-04 LAB — CBC WITH DIFFERENTIAL/PLATELET
Eosinophils Absolute: 0.2 10*3/uL (ref 0.0–0.5)
HCT: 40.3 % (ref 34.8–46.6)
HGB: 13.2 g/dL (ref 11.6–15.9)
LYMPH%: 11.3 % — ABNORMAL LOW (ref 14.0–49.7)
MONO#: 0.7 10*3/uL (ref 0.1–0.9)
NEUT#: 12.6 10*3/uL — ABNORMAL HIGH (ref 1.5–6.5)
NEUT%: 82 % — ABNORMAL HIGH (ref 38.4–76.8)
Platelets: 275 10*3/uL (ref 145–400)
WBC: 15.4 10*3/uL — ABNORMAL HIGH (ref 3.9–10.3)
lymph#: 1.7 10*3/uL (ref 0.9–3.3)

## 2013-06-04 LAB — IRON AND TIBC CHCC
%SAT: 18 % — ABNORMAL LOW (ref 21–57)
TIBC: 301 ug/dL (ref 236–444)
UIBC: 246 ug/dL (ref 120–384)

## 2013-06-04 NOTE — Telephone Encounter (Signed)
[name] and advised on time change on 7.9.14.Marland KitchenMarland Kitchenpt ok and aware

## 2013-06-05 ENCOUNTER — Telehealth: Payer: Self-pay | Admitting: Internal Medicine

## 2013-06-05 ENCOUNTER — Ambulatory Visit (HOSPITAL_BASED_OUTPATIENT_CLINIC_OR_DEPARTMENT_OTHER): Payer: Federal, State, Local not specified - PPO | Admitting: Internal Medicine

## 2013-06-05 ENCOUNTER — Encounter: Payer: Self-pay | Admitting: Internal Medicine

## 2013-06-05 ENCOUNTER — Ambulatory Visit: Payer: Federal, State, Local not specified - PPO | Admitting: Internal Medicine

## 2013-06-05 VITALS — BP 171/84 | HR 79 | Temp 97.0°F | Resp 20 | Ht 69.0 in | Wt >= 6400 oz

## 2013-06-05 DIAGNOSIS — D649 Anemia, unspecified: Secondary | ICD-10-CM

## 2013-06-05 NOTE — Telephone Encounter (Signed)
gave pt appt for October 2014 lab and MD printed AVS

## 2013-06-05 NOTE — Patient Instructions (Signed)
Continue treatment was Integra plus.  Followup visit in 3 months with repeat CBC and iron study.

## 2013-06-05 NOTE — Progress Notes (Signed)
[facility] Telephone:([phone]   Fax:(336) [phone]  OFFICE PROGRESS NOTE  Gaye Alken, MD [address] [place] [address]  DIAGNOSIS: Iron deficiency anemia.   PRIOR THERAPY: Feraheme infusion 510 mg IV x2 doses last dose was given 11/08/2012.   CURRENT THERAPY: Integra plus 1 capsule by mouth daily.  INTERVAL HISTORY: [name] 48 y.o. female returns to the clinic today for followup visit accompanied her daughter. The patient has no complaints today. She denied having any significant fatigue weakness. She denied having any dizzy spells. She has no significant bleeding or bruises. She is tolerating her treatment with Integra plus fairly well with no significant adverse effects. The patient is scheduled for uterine ablation procedure in the next few weeks by [name]. She has repeat CBC and iron study performed recently and she is here for evaluation and discussion of her lab results.  MEDICAL HISTORY: Past Medical History  Diagnosis Date  . Hypertension   . Pneumonia 07/18/2011  . Morbid obesity with BMI of 60.0-69.9, adult     BMI 62    ALLERGIES:  is allergic to darvocet and penicillins.  MEDICATIONS:  Current Outpatient Prescriptions  Medication Sig Dispense Refill  . desogestrel-ethinyl estradiol (AZURETTE) 0.15-0.02/0.01 MG (21/5) tablet Take 1 tablet by mouth daily.  1 Package  11  . FeFum-FePoly-FA-B Cmp-C-Biot (INTEGRA PLUS) CAPS Take 1 capsule by mouth every morning.  30 capsule  3  . FLUoxetine (PROZAC) 20 MG capsule Takes 30 mg      . iron polysaccharides (NIFEREX) 150 MG capsule Take 1 capsule (150 mg total) by mouth daily.  30 capsule  3  . metoprolol (LOPRESSOR) 100 MG tablet Take 100 mg by mouth daily.         No current facility-administered medications for this visit.    SURGICAL HISTORY:  Past Surgical History  Procedure Laterality Date  . Tonsillectomy    . Cholecystectomy    . Appendectomy    .  Cesarean section      REVIEW OF SYSTEMS:  A comprehensive review of systems was negative.   PHYSICAL EXAMINATION: General appearance: alert, cooperative and no distress Head: Normocephalic, without obvious abnormality, atraumatic Neck: no adenopathy Lymph nodes: Cervical, supraclavicular, and axillary nodes normal. Resp: clear to auscultation bilaterally Cardio: regular rate and rhythm, S1, S2 normal, no murmur, click, rub or gallop GI: soft, non-tender; bowel sounds normal; no masses,  no organomegaly Extremities: extremities normal, atraumatic, no cyanosis or edema  ECOG PERFORMANCE STATUS: 1 - Symptomatic but completely ambulatory  Blood pressure 171/84, pulse 79, temperature 97 F (36.1 C), temperature source Oral, resp. rate 20, height 5\' 9"  (1.753 m), weight 439 lb 3.2 oz (199.22 kg).  LABORATORY DATA: Lab Results  Component Value Date   WBC 15.4* 06/04/2013   HGB 13.2 06/04/2013   HCT 40.3 06/04/2013   MCV 87.0 06/04/2013   PLT 275 06/04/2013      Chemistry      Component Value Date/Time   NA 144 07/27/2011 1147   K 4.0 07/27/2011 1147   CL 103 07/27/2011 1147   CO2 27 07/27/2011 1147   BUN 18 07/27/2011 1147   CREATININE 0.6 07/27/2011 1147      Component Value Date/Time   CALCIUM 8.9 07/27/2011 1147       RADIOGRAPHIC STUDIES: No results found.  ASSESSMENT AND PLAN: This is a very pleasant 47 years old white female with history of iron deficiency anemia currently  on treatment with Integra plus and tolerating it fairly well. Her CBC and iron diameter are normal today. I recommended for the patient to continue treatment with Integra plus for now. I would see her back for followup visit in 3 months with repeat CBC and iron study.  She was advised to call immediately if she has any concerning symptoms in the interval.  All questions were answered. The patient knows to call the clinic with any problems, questions or concerns. We can certainly see the patient much sooner if  necessary.

## 2013-06-11 ENCOUNTER — Ambulatory Visit: Payer: Federal, State, Local not specified - PPO | Admitting: Internal Medicine

## 2013-06-14 ENCOUNTER — Other Ambulatory Visit: Payer: Self-pay | Admitting: Obstetrics and Gynecology

## 2013-06-20 ENCOUNTER — Other Ambulatory Visit: Payer: Self-pay | Admitting: Obstetrics and Gynecology

## 2013-06-20 DIAGNOSIS — Z1231 Encounter for screening mammogram for malignant neoplasm of breast: Secondary | ICD-10-CM

## 2013-07-09 ENCOUNTER — Ambulatory Visit (HOSPITAL_COMMUNITY)
Admission: RE | Admit: 2013-07-09 | Discharge: 2013-07-09 | Disposition: A | Discharge status: Home / Self Care | Payer: Federal, State, Local not specified - PPO | Source: Ambulatory Visit | Attending: Obstetrics and Gynecology | Admitting: Obstetrics and Gynecology

## 2013-07-09 DIAGNOSIS — Z1231 Encounter for screening mammogram for malignant neoplasm of breast: Secondary | ICD-10-CM

## 2013-07-11 ENCOUNTER — Other Ambulatory Visit: Payer: Self-pay | Admitting: Family Medicine

## 2013-07-11 ENCOUNTER — Ambulatory Visit
Admission: RE | Admit: 2013-07-11 | Discharge: 2013-07-11 | Disposition: A | Discharge status: Home / Self Care | Payer: Federal, State, Local not specified - PPO | Source: Ambulatory Visit | Attending: Family Medicine | Admitting: Family Medicine

## 2013-07-11 DIAGNOSIS — R109 Unspecified abdominal pain: Secondary | ICD-10-CM

## 2013-07-11 MED ORDER — IOHEXOL 300 MG/ML  SOLN
125.0000 mL | Freq: Once | INTRAMUSCULAR | Status: AC | PRN
  Administered 2013-07-11: 125 mL via INTRAVENOUS

## 2013-08-26 ENCOUNTER — Telehealth: Payer: Self-pay | Admitting: Internal Medicine

## 2013-08-26 NOTE — Telephone Encounter (Signed)
pt called to r/s appts...done

## 2013-08-29 ENCOUNTER — Other Ambulatory Visit (HOSPITAL_BASED_OUTPATIENT_CLINIC_OR_DEPARTMENT_OTHER): Payer: Federal, State, Local not specified - PPO | Admitting: Lab

## 2013-08-29 DIAGNOSIS — D649 Anemia, unspecified: Secondary | ICD-10-CM

## 2013-08-29 LAB — IRON AND TIBC CHCC
%SAT: 23 % (ref 21–57)
Iron: 70 ug/dL (ref 41–142)
TIBC: 298 ug/dL (ref 236–444)
UIBC: 228 ug/dL (ref 120–384)

## 2013-08-29 LAB — CBC WITH DIFFERENTIAL/PLATELET
Basophils Absolute: 0.1 10*3/uL (ref 0.0–0.1)
Eosinophils Absolute: 0.2 10*3/uL (ref 0.0–0.5)
HGB: 13 g/dL (ref 11.6–15.9)
MCV: 88.9 fL (ref 79.5–101.0)
MONO#: 0.7 10*3/uL (ref 0.1–0.9)
MONO%: 5.1 % (ref 0.0–14.0)
NEUT#: 11.3 10*3/uL — ABNORMAL HIGH (ref 1.5–6.5)
RDW: 14.2 % (ref 11.2–14.5)
WBC: 14 10*3/uL — ABNORMAL HIGH (ref 3.9–10.3)
lymph#: 1.8 10*3/uL (ref 0.9–3.3)

## 2013-08-29 LAB — FERRITIN CHCC: Ferritin: 244 ng/ml (ref 9–269)

## 2013-09-03 ENCOUNTER — Other Ambulatory Visit: Payer: Federal, State, Local not specified - PPO

## 2013-09-05 ENCOUNTER — Ambulatory Visit: Payer: Federal, State, Local not specified - PPO | Admitting: Internal Medicine

## 2013-09-10 ENCOUNTER — Telehealth: Payer: Self-pay | Admitting: Internal Medicine

## 2013-09-10 ENCOUNTER — Ambulatory Visit (HOSPITAL_BASED_OUTPATIENT_CLINIC_OR_DEPARTMENT_OTHER): Payer: Federal, State, Local not specified - PPO | Admitting: Internal Medicine

## 2013-09-10 ENCOUNTER — Encounter: Payer: Self-pay | Admitting: Internal Medicine

## 2013-09-10 VITALS — BP 174/71 | HR 74 | Temp 97.6°F | Resp 20 | Ht 69.0 in | Wt >= 6400 oz

## 2013-09-10 DIAGNOSIS — D649 Anemia, unspecified: Secondary | ICD-10-CM

## 2013-09-10 DIAGNOSIS — D500 Iron deficiency anemia secondary to blood loss (chronic): Secondary | ICD-10-CM

## 2013-09-10 DIAGNOSIS — N920 Excessive and frequent menstruation with regular cycle: Secondary | ICD-10-CM

## 2013-09-10 NOTE — Telephone Encounter (Signed)
gave pt appt for lab and MD on january 2015

## 2013-09-10 NOTE — Patient Instructions (Signed)
Follow up visit in 3 months with repeat CBC, iron study and ferritin 

## 2013-09-10 NOTE — Progress Notes (Signed)
[facility] Telephone:([phone]   Fax:([phone]  OFFICE PROGRESS NOTE  Gaye Alken, MD [address] [place] [address]  DIAGNOSIS: Iron deficiency anemia.   PRIOR THERAPY: Feraheme infusion 510 mg IV x2 doses last dose was given 11/08/2012.   CURRENT THERAPY: Integra plus 1 capsule by mouth daily.   INTERVAL HISTORY: [name] 47 y.o. female returns to the clinic today for routine followup visit. The patient is feeling fine today with no specific complaints. She denied having any significant chest pain, shortness of breath, cough or hemoptysis. She denied having any fatigue or weakness. The patient had uterine ablation performed 3 months ago and she still has minor bleeding. She is tolerating her treatment with Integra plus fairly well. The patient denied having any significant nausea or vomiting. She had repeat CBC, iron study and ferritin performed recently and she is here for evaluation and discussion of her lab results.  MEDICAL HISTORY: Past Medical History  Diagnosis Date  . Hypertension   . Pneumonia 07/18/2011  . Morbid obesity with BMI of 60.0-69.9, adult     BMI 62    ALLERGIES:  is allergic to darvocet and penicillins.  MEDICATIONS:  Current Outpatient Prescriptions  Medication Sig Dispense Refill  . desogestrel-ethinyl estradiol (AZURETTE) 0.15-0.02/0.01 MG (21/5) tablet Take 1 tablet by mouth daily.  1 Package  11  . FeFum-FePoly-FA-B Cmp-C-Biot (INTEGRA PLUS) CAPS Take 1 capsule by mouth every morning.  30 capsule  3  . FLUoxetine (PROZAC) 20 MG capsule Takes 30 mg      . iron polysaccharides (NIFEREX) 150 MG capsule Take 1 capsule (150 mg total) by mouth daily.  30 capsule  3  . metoprolol (LOPRESSOR) 100 MG tablet Take 100 mg by mouth daily.         No current facility-administered medications for this visit.    SURGICAL HISTORY:  Past Surgical History  Procedure Laterality Date  . Tonsillectomy    .  Cholecystectomy    . Appendectomy    . Cesarean section      REVIEW OF SYSTEMS:  A comprehensive review of systems was negative except for: Constitutional: positive for fatigue   PHYSICAL EXAMINATION: General appearance: alert, cooperative and no distress Head: Normocephalic, without obvious abnormality, atraumatic Neck: no adenopathy, no JVD, supple, symmetrical, trachea midline and thyroid not enlarged, symmetric, no tenderness/mass/nodules Lymph nodes: Cervical, supraclavicular, and axillary nodes normal. Resp: clear to auscultation bilaterally Back: symmetric, no curvature. ROM normal. No CVA tenderness. Cardio: regular rate and rhythm, S1, S2 normal, no murmur, click, rub or gallop GI: soft, non-tender; bowel sounds normal; no masses,  no organomegaly Extremities: extremities normal, atraumatic, no cyanosis or edema  ECOG PERFORMANCE STATUS: 1 - Symptomatic but completely ambulatory  Blood pressure 174/71, pulse 74, temperature 97.6 F (36.4 C), temperature source Oral, resp. rate 20, height 5\' 9"  (1.753 m), weight 441 lb (200.036 kg).  LABORATORY DATA: Lab Results  Component Value Date   WBC 14.0* 08/29/2013   HGB 13.0 08/29/2013   HCT 39.6 08/29/2013   MCV 88.9 08/29/2013   PLT 279 08/29/2013      Chemistry      Component Value Date/Time   NA 144 07/27/2011 1147   K 4.0 07/27/2011 1147   CL 103 07/27/2011 1147   CO2 27 07/27/2011 1147   BUN 18 07/27/2011 1147   CREATININE 0.6 07/27/2011 1147      Component Value Date/Time   CALCIUM 8.9 07/27/2011  1147       RADIOGRAPHIC STUDIES: No results found.  ASSESSMENT AND PLAN: This is a very pleasant 47 years old white female with history of deficiency anemia secondary to menorrhagia currently on treatment with Integra plus 1 capsule by mouth daily and tolerating it fairly well. His CBC and iron study today are unremarkable. I recommended for the patient to continue on the tablet today as scheduled. I would see her back for  followup visit in 3 months with repeat CBC, iron study and ferritin. She was advised to call immediately if she has any concerning symptoms in the interval. The patient voices understanding of current disease status and treatment options and is in agreement with the current care plan.  All questions were answered. The patient knows to call the clinic with any problems, questions or concerns. We can certainly see the patient much sooner if necessary.

## 2013-09-13 ENCOUNTER — Other Ambulatory Visit: Payer: Self-pay | Admitting: Internal Medicine

## 2013-12-10 ENCOUNTER — Other Ambulatory Visit (HOSPITAL_BASED_OUTPATIENT_CLINIC_OR_DEPARTMENT_OTHER): Payer: Federal, State, Local not specified - PPO

## 2013-12-10 ENCOUNTER — Encounter (INDEPENDENT_AMBULATORY_CARE_PROVIDER_SITE_OTHER): Payer: Self-pay

## 2013-12-10 DIAGNOSIS — D649 Anemia, unspecified: Secondary | ICD-10-CM

## 2013-12-10 DIAGNOSIS — D509 Iron deficiency anemia, unspecified: Secondary | ICD-10-CM

## 2013-12-10 LAB — CBC WITH DIFFERENTIAL/PLATELET
BASO%: 0.5 % (ref 0.0–2.0)
Basophils Absolute: 0.1 10*3/uL (ref 0.0–0.1)
EOS ABS: 0.2 10*3/uL (ref 0.0–0.5)
EOS%: 1.3 % (ref 0.0–7.0)
HEMATOCRIT: 39.4 % (ref 34.8–46.6)
HGB: 12.9 g/dL (ref 11.6–15.9)
LYMPH%: 14.7 % (ref 14.0–49.7)
MCH: 29.4 pg (ref 25.1–34.0)
MCHC: 32.7 g/dL (ref 31.5–36.0)
MCV: 89.9 fL (ref 79.5–101.0)
MONO#: 0.7 10*3/uL (ref 0.1–0.9)
MONO%: 5.5 % (ref 0.0–14.0)
NEUT%: 78 % — ABNORMAL HIGH (ref 38.4–76.8)
NEUTROS ABS: 10.7 10*3/uL — AB (ref 1.5–6.5)
PLATELETS: 290 10*3/uL (ref 145–400)
RBC: 4.38 10*6/uL (ref 3.70–5.45)
RDW: 14.7 % — AB (ref 11.2–14.5)
WBC: 13.7 10*3/uL — ABNORMAL HIGH (ref 3.9–10.3)
lymph#: 2 10*3/uL (ref 0.9–3.3)

## 2013-12-10 LAB — IRON AND TIBC CHCC
%SAT: 20 % — AB (ref 21–57)
IRON: 61 ug/dL (ref 41–142)
TIBC: 299 ug/dL (ref 236–444)
UIBC: 238 ug/dL (ref 120–384)

## 2013-12-10 LAB — FERRITIN CHCC: Ferritin: 203 ng/ml (ref 9–269)

## 2013-12-12 ENCOUNTER — Ambulatory Visit (HOSPITAL_BASED_OUTPATIENT_CLINIC_OR_DEPARTMENT_OTHER): Payer: Federal, State, Local not specified - PPO | Admitting: Internal Medicine

## 2013-12-12 ENCOUNTER — Encounter: Payer: Self-pay | Admitting: Internal Medicine

## 2013-12-12 VITALS — BP 158/77 | HR 72 | Temp 97.0°F | Resp 20 | Ht 69.0 in | Wt >= 6400 oz

## 2013-12-12 DIAGNOSIS — D509 Iron deficiency anemia, unspecified: Secondary | ICD-10-CM

## 2013-12-12 DIAGNOSIS — N920 Excessive and frequent menstruation with regular cycle: Secondary | ICD-10-CM

## 2013-12-12 DIAGNOSIS — D649 Anemia, unspecified: Secondary | ICD-10-CM

## 2013-12-12 NOTE — Progress Notes (Signed)
[facility] Telephone:(336) [phone]   Fax:(336) [phone]  OFFICE PROGRESS NOTE  Gaye AlkenBARNES,[name] STEWART, MD [address] [place] [address]  DIAGNOSIS: Iron deficiency anemia.   PRIOR THERAPY: Feraheme infusion 510 mg IV x2 doses last dose was given 11/08/2012.   CURRENT THERAPY: Integra plus 1 capsule by mouth daily.   INTERVAL HISTORY: Bobbye RiggsDenise B Gohlke 48 y.o. female returns to the clinic today for routine followup visit. The patient is feeling fine today with no specific complaints. She denied having any significant chest pain, shortness of breath, cough or hemoptysis. She denied having any fatigue or weakness. She has no more bleeding since the uterine ablation performed 9 months ago and she still has minor bleeding. She is tolerating her treatment with Integra plus fairly well. The patient denied having any significant nausea or vomiting. She had repeat CBC, iron study and ferritin performed recently and she is here for evaluation and discussion of her lab results.  MEDICAL HISTORY: Past Medical History  Diagnosis Date  . Hypertension   . Pneumonia 07/18/2011  . Morbid obesity with BMI of 60.0-69.9, adult     BMI 62    ALLERGIES:  is allergic to darvocet and penicillins.  MEDICATIONS:  Current Outpatient Prescriptions  Medication Sig Dispense Refill  . desogestrel-ethinyl estradiol (AZURETTE) 0.15-0.02/0.01 MG (21/5) tablet Take 1 tablet by mouth daily.  1 Package  11  . FLUoxetine (PROZAC) 20 MG capsule Takes 30 mg      . metoprolol (LOPRESSOR) 100 MG tablet Take 100 mg by mouth daily.        Marland Kitchen. POLY-IRON 150 FORTE 150-25-1 MG-MCG-MG CAPS TAKE ONE CAPSULE BY MOUTH EVERY DAY  30 each  2  . Vitamin D, Ergocalciferol, (DRISDOL) 50000 UNITS CAPS capsule Take 50,000 Units by mouth every 7 (seven) days.       No current facility-administered medications for this visit.    SURGICAL HISTORY:  Past Surgical History  Procedure Laterality Date  .  Tonsillectomy    . Cholecystectomy    . Appendectomy    . Cesarean section      REVIEW OF SYSTEMS:  A comprehensive review of systems was negative except for: Constitutional: positive for fatigue   PHYSICAL EXAMINATION: General appearance: alert, cooperative and no distress Head: Normocephalic, without obvious abnormality, atraumatic Neck: no adenopathy, no JVD, supple, symmetrical, trachea midline and thyroid not enlarged, symmetric, no tenderness/mass/nodules Lymph nodes: Cervical, supraclavicular, and axillary nodes normal. Resp: clear to auscultation bilaterally Back: symmetric, no curvature. ROM normal. No CVA tenderness. Cardio: regular rate and rhythm, S1, S2 normal, no murmur, click, rub or gallop GI: soft, non-tender; bowel sounds normal; no masses,  no organomegaly Extremities: extremities normal, atraumatic, no cyanosis or edema  ECOG PERFORMANCE STATUS: 1 - Symptomatic but completely ambulatory  Blood pressure 158/77, pulse 72, temperature 97 F (36.1 C), temperature source Oral, resp. rate 20, height 5\' 9"  (1.753 m), weight 445 lb 8 oz (202.077 kg), SpO2 100.00%.  LABORATORY DATA: Lab Results  Component Value Date   WBC 13.7* 12/10/2013   HGB 12.9 12/10/2013   HCT 39.4 12/10/2013   MCV 89.9 12/10/2013   PLT 290 12/10/2013      Chemistry      Component Value Date/Time   NA 144 07/27/2011 1147   K 4.0 07/27/2011 1147   CL 103 07/27/2011 1147   CO2 27 07/27/2011 1147   BUN 18 07/27/2011 1147   CREATININE 0.6 07/27/2011 1147  Component Value Date/Time   CALCIUM 8.9 07/27/2011 1147       RADIOGRAPHIC STUDIES: No results found.  ASSESSMENT AND PLAN: This is a very pleasant 48 years old white female with history of deficiency anemia secondary to menorrhagia currently on treatment with Integra plus 1 capsule by mouth daily and tolerating it fairly well. His CBC and iron study today are unremarkable. I recommended for the patient to continue on the tablet today as  scheduled. I would see her back for followup visit in 6 months with repeat CBC, iron study and ferritin. She was advised to call immediately if she has any concerning symptoms in the interval. The patient voices understanding of current disease status and treatment options and is in agreement with the current care plan.  All questions were answered. The patient knows to call the clinic with any problems, questions or concerns. We can certainly see the patient much sooner if necessary.

## 2013-12-12 NOTE — Patient Instructions (Signed)
Followup visit in 6 months with repeat CBC, iron study and ferritin. 

## 2013-12-13 ENCOUNTER — Other Ambulatory Visit: Payer: Self-pay | Admitting: *Deleted

## 2013-12-13 DIAGNOSIS — D649 Anemia, unspecified: Secondary | ICD-10-CM

## 2013-12-13 MED ORDER — IRON POLYSACCH CMPLX-B12-FA 150-0.025-1 MG PO CAPS: ORAL_CAPSULE | ORAL | Status: DC

## 2013-12-16 ENCOUNTER — Telehealth: Payer: Self-pay | Admitting: Internal Medicine

## 2013-12-16 NOTE — Telephone Encounter (Signed)
s.w. pt and advised on July appt...pt ok adn aware....amiled pt appt sched///avs adn letter °

## 2013-12-16 NOTE — Telephone Encounter (Signed)
s.w. pt and advised on July appt...pt ok adn aware....amiled pt appt sched///avs adn letter

## 2014-06-05 ENCOUNTER — Other Ambulatory Visit (HOSPITAL_BASED_OUTPATIENT_CLINIC_OR_DEPARTMENT_OTHER): Payer: Federal, State, Local not specified - PPO

## 2014-06-05 ENCOUNTER — Other Ambulatory Visit (HOSPITAL_COMMUNITY): Payer: Self-pay | Admitting: Obstetrics and Gynecology

## 2014-06-05 DIAGNOSIS — Z1231 Encounter for screening mammogram for malignant neoplasm of breast: Secondary | ICD-10-CM

## 2014-06-05 DIAGNOSIS — D509 Iron deficiency anemia, unspecified: Secondary | ICD-10-CM

## 2014-06-05 DIAGNOSIS — D649 Anemia, unspecified: Secondary | ICD-10-CM

## 2014-06-05 LAB — CBC WITH DIFFERENTIAL/PLATELET
BASO%: 0.4 % (ref 0.0–2.0)
BASOS ABS: 0.1 10*3/uL (ref 0.0–0.1)
EOS%: 1.2 % (ref 0.0–7.0)
Eosinophils Absolute: 0.2 10*3/uL (ref 0.0–0.5)
HCT: 41.1 % (ref 34.8–46.6)
HEMOGLOBIN: 13.1 g/dL (ref 11.6–15.9)
LYMPH%: 12.3 % — ABNORMAL LOW (ref 14.0–49.7)
MCH: 28.5 pg (ref 25.1–34.0)
MCHC: 31.9 g/dL (ref 31.5–36.0)
MCV: 89.2 fL (ref 79.5–101.0)
MONO#: 0.9 10*3/uL (ref 0.1–0.9)
MONO%: 5.9 % (ref 0.0–14.0)
NEUT#: 12.3 10*3/uL — ABNORMAL HIGH (ref 1.5–6.5)
NEUT%: 80.2 % — ABNORMAL HIGH (ref 38.4–76.8)
Platelets: 301 10*3/uL (ref 145–400)
RBC: 4.6 10*6/uL (ref 3.70–5.45)
RDW: 14.3 % (ref 11.2–14.5)
WBC: 15.3 10*3/uL — ABNORMAL HIGH (ref 3.9–10.3)
lymph#: 1.9 10*3/uL (ref 0.9–3.3)

## 2014-06-05 LAB — IRON AND TIBC CHCC
%SAT: 16 % — ABNORMAL LOW (ref 21–57)
IRON: 51 ug/dL (ref 41–142)
TIBC: 326 ug/dL (ref 236–444)
UIBC: 276 ug/dL (ref 120–384)

## 2014-06-05 LAB — FERRITIN CHCC: Ferritin: 154 ng/ml (ref 9–269)

## 2014-06-12 ENCOUNTER — Encounter: Payer: Self-pay | Admitting: Internal Medicine

## 2014-06-12 ENCOUNTER — Ambulatory Visit (HOSPITAL_BASED_OUTPATIENT_CLINIC_OR_DEPARTMENT_OTHER): Payer: Federal, State, Local not specified - PPO | Admitting: Internal Medicine

## 2014-06-12 VITALS — BP 158/73 | HR 79 | Temp 98.6°F | Resp 21 | Ht 69.0 in | Wt >= 6400 oz

## 2014-06-12 DIAGNOSIS — D500 Iron deficiency anemia secondary to blood loss (chronic): Secondary | ICD-10-CM

## 2014-06-12 DIAGNOSIS — D509 Iron deficiency anemia, unspecified: Secondary | ICD-10-CM

## 2014-06-12 NOTE — Progress Notes (Signed)
[facility] Telephone:(336) [phone]   Fax:(336) [phone]  OFFICE PROGRESS NOTE  Gaye AlkenBARNES,[name] STEWART, MD [address] [place] [address]  DIAGNOSIS: Iron deficiency anemia.   PRIOR THERAPY: Feraheme infusion 510 mg IV x2 doses last dose was given 11/08/2012.   CURRENT THERAPY: Integra plus 1 capsule by mouth daily.   INTERVAL HISTORY: Tamara RiggsDenise B Gutknecht 48 y.o. female returns to the clinic today for routine followup visit. The patient is feeling fine today with no specific complaints. She denied having any significant chest pain, shortness of breath, cough or hemoptysis. She denied having any fatigue or weakness. She has no more bleeding since the uterine ablation performed more than a year ago and no more bleeding. She is tolerating her treatment with Integra plus fairly well. The patient denied having any significant nausea or vomiting. She had repeat CBC, iron study and ferritin performed recently and she is here for evaluation and discussion of her lab results.  MEDICAL HISTORY: Past Medical History  Diagnosis Date  . Hypertension   . Pneumonia 07/18/2011  . Morbid obesity with BMI of 60.0-69.9, adult     BMI 62    ALLERGIES:  is allergic to darvocet and penicillins.  MEDICATIONS:  Current Outpatient Prescriptions  Medication Sig Dispense Refill  . FLUoxetine (PROZAC) 20 MG capsule Takes 30 mg      . Iron Polysacch Cmplx-B12-FA (POLY-IRON 150 FORTE) 150-0.025-1 MG CAPS TAKE ONE CAPSULE BY MOUTH EVERY DAY  90 each  1  . metoprolol (LOPRESSOR) 100 MG tablet Take 100 mg by mouth daily.        Marland Kitchen. PROAIR HFA 108 (90 BASE) MCG/ACT inhaler Inhale 1 puff into the lungs daily as needed.      . VESTURA 3-0.02 MG tablet Take 1 tablet by mouth daily.      Marland Kitchen. desogestrel-ethinyl estradiol (AZURETTE) 0.15-0.02/0.01 MG (21/5) tablet Take 1 tablet by mouth daily.  1 Package  11   No current facility-administered medications for this visit.    SURGICAL HISTORY:   Past Surgical History  Procedure Laterality Date  . Tonsillectomy    . Cholecystectomy    . Appendectomy    . Cesarean section      REVIEW OF SYSTEMS:  A comprehensive review of systems was negative except for: Constitutional: positive for fatigue   PHYSICAL EXAMINATION: General appearance: alert, cooperative and no distress Head: Normocephalic, without obvious abnormality, atraumatic Neck: no adenopathy, no JVD, supple, symmetrical, trachea midline and thyroid not enlarged, symmetric, no tenderness/mass/nodules Lymph nodes: Cervical, supraclavicular, and axillary nodes normal. Resp: clear to auscultation bilaterally Back: symmetric, no curvature. ROM normal. No CVA tenderness. Cardio: regular rate and rhythm, S1, S2 normal, no murmur, click, rub or gallop GI: soft, non-tender; bowel sounds normal; no masses,  no organomegaly Extremities: extremities normal, atraumatic, no cyanosis or edema  ECOG PERFORMANCE STATUS: 1 - Symptomatic but completely ambulatory  Blood pressure 158/73, pulse 79, temperature 98.6 F (37 C), temperature source Oral, resp. rate 21, height 5\' 9"  (1.753 m), weight 448 lb 12.8 oz (203.574 kg), SpO2 98.00%.  LABORATORY DATA: Lab Results  Component Value Date   WBC 15.3* 06/05/2014   HGB 13.1 06/05/2014   HCT 41.1 06/05/2014   MCV 89.2 06/05/2014   PLT 301 06/05/2014      Chemistry      Component Value Date/Time   NA 144 07/27/2011 1147   K 4.0 07/27/2011 1147   CL 103 07/27/2011 1147   CO2  27 07/27/2011 1147   BUN 18 07/27/2011 1147   CREATININE 0.6 07/27/2011 1147      Component Value Date/Time   CALCIUM 8.9 07/27/2011 1147       RADIOGRAPHIC STUDIES: No results found.  ASSESSMENT AND PLAN: This is a very pleasant 48 years old white female with history of deficiency anemia secondary to menorrhagia currently on treatment with Integra plus 1 capsule by mouth daily and tolerating it fairly well. His CBC and iron study today are unremarkable. I recommended  for the patient to continue on the tablet today as scheduled. She will followup with her primary care physician. She would see me on as-needed basis at this point. She was advised to call immediately if she has any concerning symptoms in the interval. The patient voices understanding of current disease status and treatment options and is in agreement with the current care plan.  All questions were answered. The patient knows to call the clinic with any problems, questions or concerns. We can certainly see the patient much sooner if necessary.  Disclaimer: This note was dictated with voice recognition software. Similar sounding words can inadvertently be transcribed and may not be corrected upon review.

## 2014-07-10 ENCOUNTER — Ambulatory Visit (HOSPITAL_COMMUNITY)
Admission: RE | Admit: 2014-07-10 | Discharge: 2014-07-10 | Disposition: A | Discharge status: Home / Self Care | Payer: Federal, State, Local not specified - PPO | Source: Ambulatory Visit | Attending: Obstetrics and Gynecology | Admitting: Obstetrics and Gynecology

## 2014-07-10 DIAGNOSIS — Z1231 Encounter for screening mammogram for malignant neoplasm of breast: Secondary | ICD-10-CM | POA: Insufficient documentation

## 2014-07-14 ENCOUNTER — Other Ambulatory Visit: Payer: Self-pay | Admitting: Physician Assistant

## 2014-07-17 ENCOUNTER — Other Ambulatory Visit: Payer: Self-pay | Admitting: *Deleted

## 2014-07-17 DIAGNOSIS — D649 Anemia, unspecified: Secondary | ICD-10-CM

## 2014-07-17 MED ORDER — IRON POLYSACCH CMPLX-B12-FA 150-0.025-1 MG PO CAPS: ORAL_CAPSULE | ORAL | Status: DC

## 2014-07-25 ENCOUNTER — Other Ambulatory Visit: Payer: Self-pay | Admitting: *Deleted

## 2014-07-25 DIAGNOSIS — D649 Anemia, unspecified: Secondary | ICD-10-CM

## 2014-07-25 NOTE — Telephone Encounter (Signed)
CALLED [facility] TO SEND THIS REFILL REQUEST FOR POLY IRON TO PT.'S PCP, DR.[name] BARNES.

## 2014-08-21 ENCOUNTER — Telehealth (INDEPENDENT_AMBULATORY_CARE_PROVIDER_SITE_OTHER): Payer: Self-pay

## 2014-08-21 ENCOUNTER — Ambulatory Visit (INDEPENDENT_AMBULATORY_CARE_PROVIDER_SITE_OTHER): Payer: Federal, State, Local not specified - PPO | Admitting: General Surgery

## 2014-08-21 DIAGNOSIS — D509 Iron deficiency anemia, unspecified: Secondary | ICD-10-CM

## 2014-08-21 NOTE — Telephone Encounter (Signed)
Pt being seen today by Dr Andrey Campanile for initial bariatric to be eval.for gastric bypass. Lab orders ordered.

## 2014-08-25 ENCOUNTER — Other Ambulatory Visit (INDEPENDENT_AMBULATORY_CARE_PROVIDER_SITE_OTHER): Payer: Self-pay | Admitting: General Surgery

## 2014-08-25 LAB — PROTIME-INR
INR: 1.08 (ref <1.50)
Prothrombin Time: 14 seconds (ref 11.6–15.2)

## 2014-08-25 LAB — COMPREHENSIVE METABOLIC PANEL
ALT: 9 U/L (ref 0–35)
AST: 12 U/L (ref 0–37)
Albumin: 3.6 g/dL (ref 3.5–5.2)
Alkaline Phosphatase: 57 U/L (ref 39–117)
BILIRUBIN TOTAL: 0.6 mg/dL (ref 0.2–1.2)
BUN: 10 mg/dL (ref 6–23)
CALCIUM: 8.8 mg/dL (ref 8.4–10.5)
CHLORIDE: 105 meq/L (ref 96–112)
CO2: 24 meq/L (ref 19–32)
CREATININE: 0.57 mg/dL (ref 0.50–1.10)
Glucose, Bld: 102 mg/dL — ABNORMAL HIGH (ref 70–99)
Potassium: 4.1 mEq/L (ref 3.5–5.3)
Sodium: 141 mEq/L (ref 135–145)
Total Protein: 5.8 g/dL — ABNORMAL LOW (ref 6.0–8.3)

## 2014-08-25 LAB — LIPID PANEL
CHOLESTEROL: 130 mg/dL (ref 0–200)
HDL: 46 mg/dL (ref >39)
LDL Cholesterol: 54 mg/dL (ref 0–99)
Total CHOL/HDL Ratio: 2.8 Ratio
Triglycerides: 149 mg/dL (ref <150)
VLDL: 30 mg/dL (ref 0–40)

## 2014-08-25 LAB — FOLATE

## 2014-08-25 LAB — CBC WITH DIFFERENTIAL/PLATELET
Basophils Absolute: 0 10*3/uL (ref 0.0–0.1)
Basophils Relative: 0 % (ref 0–1)
EOS ABS: 0.1 10*3/uL (ref 0.0–0.7)
EOS PCT: 1 % (ref 0–5)
HCT: 38.2 % (ref 36.0–46.0)
HEMOGLOBIN: 12.4 g/dL (ref 12.0–15.0)
LYMPHS PCT: 12 % (ref 12–46)
Lymphs Abs: 1.7 10*3/uL (ref 0.7–4.0)
MCH: 28.2 pg (ref 26.0–34.0)
MCHC: 32.5 g/dL (ref 30.0–36.0)
MCV: 87 fL (ref 78.0–100.0)
MONO ABS: 0.8 10*3/uL (ref 0.1–1.0)
Monocytes Relative: 6 % (ref 3–12)
NEUTROS ABS: 11.3 10*3/uL — AB (ref 1.7–7.7)
NEUTROS PCT: 81 % — AB (ref 43–77)
Platelets: 333 10*3/uL (ref 150–400)
RBC: 4.39 MIL/uL (ref 3.87–5.11)
RDW: 14.2 % (ref 11.5–15.5)
WBC: 13.9 10*3/uL — ABNORMAL HIGH (ref 4.0–10.5)

## 2014-08-25 LAB — T4: T4, Total: 14.1 ug/dL — ABNORMAL HIGH (ref 4.5–12.0)

## 2014-08-25 LAB — IRON AND TIBC
%SAT: 18 % — ABNORMAL LOW (ref 20–55)
Iron: 60 ug/dL (ref 42–145)
TIBC: 331 ug/dL (ref 250–470)
UIBC: 271 ug/dL (ref 125–400)

## 2014-08-25 LAB — HEMOGLOBIN A1C
Hgb A1c MFr Bld: 5.6 % (ref <5.7)
Mean Plasma Glucose: 114 mg/dL (ref <117)

## 2014-08-25 LAB — VITAMIN B12: Vitamin B-12: 633 pg/mL (ref 211–911)

## 2014-08-25 LAB — TSH: TSH: 1.436 u[IU]/mL (ref 0.350–4.500)

## 2014-08-26 LAB — VITAMIN D 25 HYDROXY (VIT D DEFICIENCY, FRACTURES): VIT D 25 HYDROXY: 40 ng/mL (ref 30–89)

## 2014-08-26 LAB — H. PYLORI ANTIBODY, IGG

## 2014-08-27 ENCOUNTER — Ambulatory Visit (HOSPITAL_COMMUNITY): Admission: RE | Admit: 2014-08-27 | Payer: Federal, State, Local not specified - PPO | Source: Ambulatory Visit

## 2014-08-27 ENCOUNTER — Ambulatory Visit (HOSPITAL_COMMUNITY)
Admission: RE | Admit: 2014-08-27 | Discharge: 2014-08-27 | Disposition: A | Discharge status: Home / Self Care | Payer: Federal, State, Local not specified - PPO | Source: Ambulatory Visit | Attending: General Surgery | Admitting: General Surgery

## 2014-08-27 DIAGNOSIS — I10 Essential (primary) hypertension: Secondary | ICD-10-CM | POA: Insufficient documentation

## 2014-09-03 ENCOUNTER — Encounter: Payer: Self-pay | Admitting: Dietician

## 2014-09-03 ENCOUNTER — Encounter: Payer: Federal, State, Local not specified - PPO | Attending: General Surgery | Admitting: Dietician

## 2014-09-03 VITALS — Ht 69.0 in | Wt >= 6400 oz

## 2014-09-03 DIAGNOSIS — Z713 Dietary counseling and surveillance: Secondary | ICD-10-CM | POA: Insufficient documentation

## 2014-09-03 DIAGNOSIS — Z6841 Body Mass Index (BMI) 40.0 and over, adult: Secondary | ICD-10-CM | POA: Diagnosis not present

## 2014-09-03 NOTE — Progress Notes (Signed)
  Pre-Op Assessment Visit:  Pre-Operative RYGB Surgery  Medical Nutrition Therapy:  Appt start time: 1030   End time:  1122.  Patient was seen on 09/03/2014 for Pre-Operative RYGB Nutrition Assessment. Assessment and [facility] coordinator on 09/03/2014.   Preferred Learning Style:   No preference indicated   Learning Readiness:   Ready  Handouts given during visit include:  Pre-Op Goals Bariatric Surgery Protein Shakes  Teaching Method Utilized:  Visual Auditory Hands on  Barriers to learning/adherence to lifestyle change: none  Demonstrated degree of understanding via:  Teach Back   Patient to call the [facility] to enroll in Pre-Op and Post-Op Nutrition Education when surgery date is scheduled.

## 2014-09-03 NOTE — Patient Instructions (Signed)
Follow Pre-Op Goals Try Protein Shakes Call [facility] at [phone] when surgery is scheduled to enroll in Pre-Op Class 

## 2014-09-05 ENCOUNTER — Encounter: Payer: Federal, State, Local not specified - PPO | Admitting: Dietician

## 2014-09-05 VITALS — Wt >= 6400 oz

## 2014-09-05 DIAGNOSIS — E669 Obesity, unspecified: Secondary | ICD-10-CM

## 2014-09-05 NOTE — Progress Notes (Signed)
  Medical Nutrition Therapy:  Appt start time: 1000 end time:  1015.  SWL visit 1:  Angelique BlonderDenise returns for her 1st SWL visit in preparation for RYGB. She has not yet begun practicing her pre op goals but plans to buy some protein shakes today to try. She has been avoiding fried foods, refined carbs, and sugar and focusing more on protein foods.   MEDICATIONS: see list  Recent physical activity:   Estimated energy needs: 1600-1800 calories  Progress Towards Goal(s):  In progress.   Nutritional Diagnosis:  [place]-3.3 Overweight/obesity related to past poor dietary habits and physical inactivity as evidenced by patient w/ pending RYGB surgery following dietary guidelines for continued weight loss.     Intervention:  Nutrition counseling provided.  Monitoring/Evaluation:  Dietary intake, exercise, pre op goals, and body weight in 4 week(s).

## 2014-09-24 ENCOUNTER — Other Ambulatory Visit (INDEPENDENT_AMBULATORY_CARE_PROVIDER_SITE_OTHER): Payer: Self-pay | Admitting: General Surgery

## 2014-09-29 ENCOUNTER — Encounter: Payer: Self-pay | Admitting: Dietician

## 2014-10-01 ENCOUNTER — Ambulatory Visit (HOSPITAL_COMMUNITY): Payer: Federal, State, Local not specified - PPO

## 2014-10-09 ENCOUNTER — Encounter: Payer: Federal, State, Local not specified - PPO | Attending: General Surgery | Admitting: Dietician

## 2014-10-09 VITALS — Wt >= 6400 oz

## 2014-10-09 DIAGNOSIS — Z713 Dietary counseling and surveillance: Secondary | ICD-10-CM | POA: Diagnosis not present

## 2014-10-09 DIAGNOSIS — Z6841 Body Mass Index (BMI) 40.0 and over, adult: Secondary | ICD-10-CM | POA: Diagnosis not present

## 2014-10-09 NOTE — Progress Notes (Signed)
  Medical Nutrition Therapy:  Appt start time: 1045 end time:  1100.  SWL visit 2:  [name] returns for her 2nd SWL visit in preparation for RYGB. She has not yet tried any protein shakes but  Is working on eating slowly and focusing on protein foods. She reports anxiety about having surgery.   Wt Readings from Last 3 Encounters:  10/09/14 455 lb 8 oz (206.613 kg)  09/05/14 448 lb 12.8 oz (203.574 kg)  09/03/14 448 lb 1.6 oz (203.257 kg)   Ht Readings from Last 3 Encounters:  09/03/14 5\' 9"  (1.753 m)  06/12/14 5\' 9"  (1.753 m)  12/12/13 5\' 9"  (1.753 m)   Body mass index is 67.23 kg/(m^2). @BMIFA @ Normalized weight-for-age data available only for age 37 to 20 years. Normalized stature-for-age data available only for age 37 to 20 years.   MEDICATIONS: see list  Recent physical activity:   Estimated energy needs: 1600-1800 calories  Progress Towards Goal(s):  In progress.   Nutritional Diagnosis:  [place]-3.3 Overweight/obesity related to past poor dietary habits and physical inactivity as evidenced by patient w/ pending RYGB surgery following dietary guidelines for continued weight loss.     Intervention:  Nutrition counseling provided.  Monitoring/Evaluation:  Dietary intake, exercise, pre op goals, and body weight in 4 week(s).

## 2014-10-09 NOTE — Patient Instructions (Addendum)
Follow Pre-Op Goals Try Protein Shakes [facility] at [phone] when surgery is scheduled to enroll in Pre-Op Class   2 does of a complete multivitamin (example: Flintstones complete) 1500 mg of Calcium citrate; 500 mg 3x a day Vitamin B12 (not orally, try "sublingual")

## 2014-10-15 ENCOUNTER — Other Ambulatory Visit (INDEPENDENT_AMBULATORY_CARE_PROVIDER_SITE_OTHER): Payer: Self-pay | Admitting: General Surgery

## 2014-10-15 ENCOUNTER — Ambulatory Visit: Payer: Federal, State, Local not specified - PPO | Admitting: Dietician

## 2014-10-15 ENCOUNTER — Ambulatory Visit (HOSPITAL_COMMUNITY)
Admission: RE | Admit: 2014-10-15 | Discharge: 2014-10-15 | Disposition: A | Discharge status: Home / Self Care | Payer: Federal, State, Local not specified - PPO | Source: Ambulatory Visit | Attending: General Surgery | Admitting: General Surgery

## 2014-10-15 DIAGNOSIS — Z01818 Encounter for other preprocedural examination: Secondary | ICD-10-CM | POA: Insufficient documentation

## 2014-10-15 DIAGNOSIS — K228 Other specified diseases of esophagus: Secondary | ICD-10-CM | POA: Diagnosis not present

## 2014-11-06 ENCOUNTER — Encounter: Payer: Federal, State, Local not specified - PPO | Attending: General Surgery | Admitting: Dietician

## 2014-11-06 VITALS — Ht 69.0 in | Wt >= 6400 oz

## 2014-11-06 DIAGNOSIS — Z713 Dietary counseling and surveillance: Secondary | ICD-10-CM | POA: Diagnosis not present

## 2014-11-06 DIAGNOSIS — E669 Obesity, unspecified: Secondary | ICD-10-CM

## 2014-11-06 DIAGNOSIS — Z6841 Body Mass Index (BMI) 40.0 and over, adult: Secondary | ICD-10-CM | POA: Diagnosis not present

## 2014-11-06 NOTE — Patient Instructions (Addendum)
Work on Insurance account managerphasing out carbonation and caffeine.  Work on not drinking during meals and chewing well. Look for blended yogurt with less than 12 g of carbs per serving (Dannon Light, Oikos Triple Zero).   2 does of a complete multivitamin (example: Flintstones complete) 1500 mg of Calcium citrate; 500 mg 3x a day Vitamin B12 (not orally, try "sublingual")

## 2014-11-06 NOTE — Progress Notes (Signed)
  Medical Nutrition Therapy:  Appt start time: 1130 end time:  1145.  SWL visit 3:  [name] returns for her 3rd SWL visit in preparation for RYGB. She returns with a 4 lbs weight loss. Has [employer] Premier Protein and working on chewing well. Trying to drink more water. Still drinks diet soda or unsweet tea with caffeine. Eating less carbohydrates than before.   Not able to exercise now d/t pain. Enjoys swimming but has difficulty getting in and out of the pool. Does do some arm exercises.    Wt Readings from Last 3 Encounters:  11/06/14 451 lb (204.572 kg)  10/09/14 455 lb 8 oz (206.613 kg)  09/05/14 448 lb 12.8 oz (203.574 kg)   Ht Readings from Last 3 Encounters:  11/06/14 5\' 9"  (1.753 m)  09/03/14 5\' 9"  (1.753 m)  06/12/14 5\' 9"  (1.753 m)   Body mass index is 66.57 kg/(m^2). @BMIFA @ Normalized weight-for-age data available only for age 9 to 20 years. Normalized stature-for-age data available only for age 9 to 20 years.  MEDICATIONS: see list  Recent physical activity:   Estimated energy needs: 1600-1800 calories  Progress Towards Goal(s):  In progress.   Nutritional Diagnosis:  [place]-3.3 Overweight/obesity related to past poor dietary habits and physical inactivity as evidenced by patient w/ pending RYGB surgery following dietary guidelines for continued weight loss.    Intervention:  Nutrition counseling provided.  Monitoring/Evaluation:  Dietary intake, exercise, pre op goals, and body weight in prn.

## 2014-12-02 ENCOUNTER — Encounter: Payer: Federal, State, Local not specified - PPO | Attending: General Surgery | Admitting: Dietician

## 2014-12-02 VITALS — Wt >= 6400 oz

## 2014-12-02 DIAGNOSIS — Z6841 Body Mass Index (BMI) 40.0 and over, adult: Secondary | ICD-10-CM | POA: Insufficient documentation

## 2014-12-02 DIAGNOSIS — Z713 Dietary counseling and surveillance: Secondary | ICD-10-CM | POA: Diagnosis not present

## 2014-12-02 NOTE — Patient Instructions (Signed)
Work on phasing out carbonation and caffeine.  Work on not drinking during meals and chewing well. Look for blended yogurt with less than 12 g of carbs per serving (Dannon Light, Oikos Triple Zero).   2 does of a complete multivitamin (example: Flintstones complete) 1500 mg of Calcium citrate; 500 mg 3x a day Vitamin B12 (not orally, try "sublingual") 

## 2014-12-02 NOTE — Progress Notes (Signed)
  Medical Nutrition Therapy:  Appt start time: 1200 end time:  1215  SWL visit 4:  Tamara BlonderDenise returns for her 4th SWL visit in preparation for RYGB. She returns with a 5 lbs weight gain and reports she has eaten more over the holidays but still working on focusing on high-protein foods. Still drinking protein shakes and like them. We discussed the pre op diet and addressed the patient's questions regarding surgery.   Wt Readings from Last 3 Encounters:  12/02/14 456 lb 12.8 oz (207.203 kg)  11/06/14 451 lb (204.572 kg)  10/09/14 455 lb 8 oz (206.613 kg)   Ht Readings from Last 3 Encounters:  11/06/14 5\' 9"  (1.753 m)  09/03/14 5\' 9"  (1.753 m)  06/12/14 5\' 9"  (1.753 m)   There is no weight on file to calculate BMI. @BMIFA @ Normalized weight-for-age data available only for age 50 to 20 years. Normalized stature-for-age data available only for age 50 to 20 years.  MEDICATIONS: see list  Recent physical activity:   Estimated energy needs: 1600-1800 calories  Progress Towards Goal(s):  In progress.   Nutritional Diagnosis:  [place]-3.3 Overweight/obesity related to past poor dietary habits and physical inactivity as evidenced by patient w/ pending RYGB surgery following dietary guidelines for continued weight loss.    Intervention:  Nutrition counseling provided.  Monitoring/Evaluation:  Dietary intake, exercise, pre op goals, and body weight in prn.

## 2014-12-09 ENCOUNTER — Ambulatory Visit: Payer: Federal, State, Local not specified - PPO | Admitting: Dietician

## 2014-12-15 VITALS — Ht 69.0 in | Wt >= 6400 oz

## 2014-12-15 DIAGNOSIS — Z6841 Body Mass Index (BMI) 40.0 and over, adult: Principal | ICD-10-CM

## 2014-12-15 NOTE — Progress Notes (Signed)
  Pre-Operative Nutrition Class:  Appt start time: 830   End time:  930.  Patient was seen on 12/15/14 for Pre-Operative Bariatric Surgery Education at [facility].   Surgery date:  Surgery type: RYGB Start weight at [facility]: 448 lbs on 09/03/14 Weight today: 445 lbs  TANITA  BODY COMP RESULTS  12/15/14   BMI (kg/m^2) 65.7   Fat Mass (lbs) 281   Fat Free Mass (lbs) 164   Total Body Water (lbs) 120   Samples given per MNT protocol. Patient educated on appropriate usage: Unjury protein powder (vanilla - qty 1) Lot #: [id number] Exp: 09/2015  PB2 (qty 1) Lot #: [id number] Exp: 06/2015  Premier protein shake (chocolate - qty 1) Lot #: [id number] Exp: 07/2015  Bariatric Advantage Calcium Citrate (tropical orange - qty 1) Lot #: [id number] Exp: 03/2015   The following the learning objectives were met by the patient during this course:  Identify Pre-Op Dietary Goals and will begin 2 weeks pre-operatively  Identify appropriate sources of fluids and proteins   State protein recommendations and appropriate sources pre and post-operatively  Identify Post-Operative Dietary Goals and will follow for 2 weeks post-operatively  Identify appropriate multivitamin and calcium sources  Describe the need for physical activity post-operatively and will follow MD recommendations  State when to call healthcare provider regarding medication questions or post-operative complications  Handouts given during class include:  Pre-Op Bariatric Surgery Diet Handout  Protein Shake Handout  Post-Op Bariatric Surgery Nutrition Handout  BELT Program Information Flyer  Support Group Information Flyer  [facility] Bariatric Supplements Price List  Follow-Up Plan: Patient will follow-up at [facility] 2 weeks post operatively for diet advancement per MD.

## 2014-12-23 NOTE — Progress Notes (Signed)
Please put orders in Epic surgery 12-30-14 pre op 12-26-14 Thanks 

## 2014-12-24 NOTE — Patient Instructions (Addendum)
Tamara RiggsDenise B Townsend  12/24/2014   Your procedure is scheduled on: 12/30/2014    [facility] and follow signs to               Short Stay Center at     0515 AM.  Call this number if you have problems the morning of surgery [phone]   Remember: Follow bowel prep instructions per office   Do not eat food or drink liquids :After Midnight.     Take these medicines the morning of surgery with A SIP OF WATER: Prozac, toprol                                You may not have any metal on your body including hair pins and              piercings  Do not wear jewelry, make-up, lotions, powders or perfumes., deodorant             Do not wear nail polish.  Do not shave  48 hours prior to surgery.                Do not bring valuables to the hospital. [place] IS NOT             RESPONSIBLE   FOR VALUABLES.  Contacts, dentures or bridgework may not be worn into surgery.  Leave suitcase in the car. After surgery it may be brought to your room.         Special Instructions: coughing and deep breathing exercises, leg exercises               Please read over the following fact sheets you were given: _____________________________________________________________________             [facility] Pre[facility] Before surgery, you can play an important role.  Because skin is not sterile, your skin needs to be as free of germs as possible.  You can reduce the number of germs on your skin by washing with CHG (chlorahexidine gluconate) soap before surgery.  CHG is an antiseptic cleaner which kills germs and bonds with the skin to continue killing germs even after washing. Please DO NOT use if you have an allergy to CHG or antibacterial soaps.  If your skin becomes reddened/irritated stop using the CHG and inform your nurse when you arrive at Short Stay. Do not shave (including legs and underarms) for at least 48 hours prior to the first CHG shower.   You may shave your face/neck. Please follow these instructions carefully:  1.  Shower with CHG Soap the night before surgery and the  morning of Surgery.  2.  If you choose to wash your hair, wash your hair first as usual with your  normal  shampoo.  3.  After you shampoo, rinse your hair and body thoroughly to remove the  shampoo.                           4.  Use CHG as you would any other liquid soap.  You can apply chg directly  to the skin and wash                       Gently with a  scrungie or clean washcloth.  5.  Apply the CHG Soap to your body ONLY FROM THE NECK DOWN.   Do not use on face/ open                           Wound or open sores. Avoid contact with eyes, ears mouth and genitals (private parts).                       Wash face,  Genitals (private parts) with your normal soap.             6.  Wash thoroughly, paying special attention to the area where your surgery  will be performed.  7.  Thoroughly rinse your body with warm water from the neck down.  8.  DO NOT shower/wash with your normal soap after using and rinsing off  the CHG Soap.                9.  Pat yourself dry with a clean towel.            10.  Wear clean pajamas.            11.  Place clean sheets on your bed the night of your first shower and do not  sleep with pets. Day of Surgery : Do not apply any lotions/deodorants the morning of surgery.  Please wear clean clothes to the hospital/surgery center.  FAILURE TO FOLLOW THESE INSTRUCTIONS MAY RESULT IN THE CANCELLATION OF YOUR SURGERY PATIENT SIGNATURE_________________________________  NURSE SIGNATURE__________________________________  ________________________________________________________________________

## 2014-12-24 NOTE — Progress Notes (Signed)
Surgery on 12/30/14.  Preop on 12/26/14.  Need orders in EPIC.  Thank You.

## 2014-12-26 ENCOUNTER — Encounter (HOSPITAL_COMMUNITY): Payer: Self-pay

## 2014-12-26 ENCOUNTER — Ambulatory Visit (INDEPENDENT_AMBULATORY_CARE_PROVIDER_SITE_OTHER): Payer: Self-pay | Admitting: General Surgery

## 2014-12-26 ENCOUNTER — Encounter (HOSPITAL_COMMUNITY)
Admission: RE | Admit: 2014-12-26 | Discharge: 2014-12-26 | Disposition: A | Discharge status: Home / Self Care | Payer: Federal, State, Local not specified - PPO | Source: Ambulatory Visit | Attending: General Surgery | Admitting: General Surgery

## 2014-12-26 DIAGNOSIS — K449 Diaphragmatic hernia without obstruction or gangrene: Secondary | ICD-10-CM | POA: Insufficient documentation

## 2014-12-26 DIAGNOSIS — Z01818 Encounter for other preprocedural examination: Secondary | ICD-10-CM | POA: Diagnosis not present

## 2014-12-26 HISTORY — DX: Anxiety disorder, unspecified: F41.9

## 2014-12-26 HISTORY — DX: Personal history of other diseases of the digestive system: Z87.19

## 2014-12-26 HISTORY — DX: Major depressive disorder, single episode, unspecified: F32.9

## 2014-12-26 HISTORY — DX: Unspecified osteoarthritis, unspecified site: M19.90

## 2014-12-26 HISTORY — DX: Headache, unspecified: R51.9

## 2014-12-26 HISTORY — DX: Headache: R51

## 2014-12-26 HISTORY — DX: Depression, unspecified: F32.A

## 2014-12-26 HISTORY — DX: Anemia, unspecified: D64.9

## 2014-12-26 LAB — CBC WITH DIFFERENTIAL/PLATELET
BASOS ABS: 0 10*3/uL (ref 0.0–0.1)
Basophils Relative: 0 % (ref 0–1)
EOS ABS: 0.2 10*3/uL (ref 0.0–0.7)
EOS PCT: 1 % (ref 0–5)
HEMATOCRIT: 40.4 % (ref 36.0–46.0)
HEMOGLOBIN: 12.4 g/dL (ref 12.0–15.0)
LYMPHS PCT: 13 % (ref 12–46)
Lymphs Abs: 1.7 10*3/uL (ref 0.7–4.0)
MCH: 28.1 pg (ref 26.0–34.0)
MCHC: 30.7 g/dL (ref 30.0–36.0)
MCV: 91.6 fL (ref 78.0–100.0)
MONOS PCT: 10 % (ref 3–12)
Monocytes Absolute: 1.3 10*3/uL — ABNORMAL HIGH (ref 0.1–1.0)
NEUTROS ABS: 9.9 10*3/uL — AB (ref 1.7–7.7)
NEUTROS PCT: 76 % (ref 43–77)
PLATELETS: 318 10*3/uL (ref 150–400)
RBC: 4.41 MIL/uL (ref 3.87–5.11)
RDW: 14.7 % (ref 11.5–15.5)
WBC: 13.1 10*3/uL — ABNORMAL HIGH (ref 4.0–10.5)

## 2014-12-26 LAB — COMPREHENSIVE METABOLIC PANEL
ALK PHOS: 62 U/L (ref 39–117)
ALT: 22 U/L (ref 0–35)
AST: 27 U/L (ref 0–37)
Albumin: 3.8 g/dL (ref 3.5–5.2)
Anion gap: 12 (ref 5–15)
BUN: 16 mg/dL (ref 6–23)
CALCIUM: 9 mg/dL (ref 8.4–10.5)
CO2: 24 mmol/L (ref 19–32)
Chloride: 105 mmol/L (ref 96–112)
Creatinine, Ser: 0.6 mg/dL (ref 0.50–1.10)
GFR calc non Af Amer: >90 mL/min (ref >90)
Glucose, Bld: 98 mg/dL (ref 70–99)
Potassium: 4 mmol/L (ref 3.5–5.1)
Sodium: 141 mmol/L (ref 135–145)
Total Bilirubin: 0.6 mg/dL (ref 0.3–1.2)
Total Protein: 6.7 g/dL (ref 6.0–8.3)

## 2014-12-26 NOTE — Progress Notes (Signed)
EKG and CXR- 08/27/2014 in EPIC.

## 2014-12-26 NOTE — H&P (Signed)
[name] 12/24/2014 10:23 AM Location: [facility] Patient #: [id number] DOB: 10-08-1966 Married / Language: [name] / Race: White Female History of Present Illness Tamara Hiss M. Rayana Geurin MD; 12/26/2014 8:05 AM) Patient words: Per Op [facility].  The patient is a 49 year old female who presents for a pre-op visit. She is currently scheduled to undergo laparoscopic Roux-en-Y gastric bypass next month. I initially met her on September 24 for consideration for weight loss surgery. She underwent physician supervised weight loss and has undergone our bariatric surgery evaluation and no significant issues have been identified and she has been currently approved. She does have somewhat of a chronic leukocytosis infiltrating her labs over the past several years. She is otherwise asymptomatic. Her upper GI showed a small hiatal hernia. Her EKG was normal. She denies any changes to her medical history since she was initially seen. Her initial visit weight in September was 452 pounds with a BMI of 66.81 however her height was recorded incorrectly. She has started her preoperative diet and has lost some weight.  A comprehensive 12 point ROS was performed and is unchanged from 07/2014. Other Problems Tamara Curry, MD; 12/26/2014 8:05 AM) Migraine Headache OSTEOARTHRITIS OF BOTH KNEES, UNSPECIFIED OSTEOARTHRITIS TYPE (715.96  M17.0) ESSENTIAL HYPERTENSION (401.9  I10) OBESITY, MORBID, BMI 50 OR HIGHER (278.01  E66.01)  Past Surgical History Tamara Curry, MD; 12/26/2014 8:05 AM) Appendectomy Cesarean Section - 1 Gallbladder Surgery - Laparoscopic Oral Surgery Tonsillectomy  Diagnostic Studies History Tamara Curry, MD; 12/26/2014 8:05 AM) Colonoscopy never Mammogram within last year Pap Smear 1-5 years ago  Allergies Festus Holts, LPN; 3/54/6568 12:75 AM) Penicillins Darvocet A500 *ANALGESICS - OPIOID*  Medication History Tamara Curry, MD; 12/26/2014 8:05 AM) PROzac (20MG  Capsule, Oral) Active. Iron Combinations (Oral) Active. Metoprolol Succinate ER (100MG Tablet ER 24HR, Oral two times daily) Active. Vestura (3-0.02MG Tablet, Oral) Active. Vitamin D (Ergocalciferol) (50000UNIT Capsule, Oral) Active. Multivitamins (Oral) Active. OxyCODONE HCl (5MG/5ML Solution, 5-10 Milliliter Oral every four hours, as needed, Taken starting 12/24/2014) Active. PROzac (10MG Capsule, Oral) Active.  Social History Tamara Curry, MD; 12/26/2014 8:05 AM) No drug use Tobacco use Never smoker. No alcohol use Caffeine use Carbonated beverages, Tea.  Family History Tamara Curry, MD; 12/26/2014 8:05 AM) Diabetes Mellitus Father. Hypertension Brother, Father, Mother. Arthritis Father, Mother. Colon Cancer Father.  Pregnancy / Birth History Tamara Curry, MD; 12/26/2014 8:05 AM) Contraceptive History Oral contraceptives. Gravida 2 Maternal age 62-40 Age at menarche 38 years. Para 1    Vitals Festus Holts LPN; 1/70/0174 94:49 AM) 12/24/2014 10:24 AM Weight: 445 lb Height: 66.5in Body Surface Area: 3.08 m Body Mass Index: 70.75 kg/m Temp.: 98.66F(Temporal)  Pulse: 84 (Regular)  Resp.: 24 (Unlabored)  BP: 132/86 (Sitting, Left Arm, Standard)     Physical Exam Tamara Hiss M. Jansen Sciuto MD; 12/26/2014 8:00 AM)  The physical exam findings are as follows: Note:MORBIDILY OBESE; PEAR SHAPED;  General Mental Status-Alert. General Appearance-Consistent with stated age. Hydration-Well hydrated. Voice-Normal.  Head and Neck Head-normocephalic, atraumatic with no lesions or palpable masses. Trachea-midline. Thyroid Gland Characteristics - normal size and consistency.  Eye Eyeball - Bilateral-Extraocular movements intact. Sclera/Conjunctiva - Bilateral-No scleral icterus.  Chest and Lung Exam Chest and lung exam reveals -quiet, even and easy respiratory effort with no use of accessory muscles and on auscultation,  normal breath sounds, no adventitious sounds and normal vocal resonance. Inspection Chest Wall - Normal. Back - normal.  Breast Breast - Left-Symmetric, Non Tender, No Biopsy scars,  no Dimpling, No Inflammation, No Lumpectomy scars, No Mastectomy scars, No Peau d' Orange. Breast - Right-Symmetric, Non Tender, No Biopsy scars, no Dimpling, No Inflammation, No Lumpectomy scars, No Mastectomy scars, No Peau d' Orange. Breast Lump-No Palpable Breast Mass.  Cardiovascular Cardiovascular examination reveals -normal heart sounds, regular rate and rhythm with no murmurs and normal pedal pulses bilaterally.  Abdomen Inspection Inspection of the abdomen reveals - No Hernias. Skin - Scar - Right Lower Quadrant(OLD APPY). Palpation/Percussion Palpation and Percussion of the abdomen reveal - Soft, Non Tender, No Rebound tenderness, No Rigidity (guarding) and No hepatosplenomegaly. Auscultation Auscultation of the abdomen reveals - Bowel sounds normal.  Neurologic Neurologic evaluation reveals -alert and oriented x 3 with no impairment of recent or remote memory. Mental Status-Normal.  Musculoskeletal Normal Exam - Left-Upper Extremity Strength Normal and Lower Extremity Strength Normal. Normal Exam - Right-Upper Extremity Strength Normal and Lower Extremity Strength Normal.  Lymphatic Head & Neck  General Head & Neck Lymphatics: Bilateral - Description - Normal. Axillary  General Axillary Region: Bilateral - Description - Normal. Tenderness - Non Tender. Femoral & Inguinal  Generalized Femoral & Inguinal Lymphatics: Bilateral - Description - Normal. Tenderness - Non Tender.    Assessment & Plan Tamara Hiss M. Tamara Yearwood MD; 12/26/2014 8:04 AM)  OBESITY, MORBID, BMI 50 OR HIGHER (278.01  E66.01) Impression: We reviewed her workup today. We discussed the findings of a small hiatal hernia. She doesn't really have symptoms of reflux. I explained that we would test for hiatal hernia  intraoperatively. If there was a clinically significant hiatal hernia that I would recommend repairing it at the same time. We discussed with hiatal hernia repair would involve. We also discussed the typical hospitalization and postoperative course. We discussed the importance of the preoperative diet. We discussed the preoperative bowel prep as well. She was given her postoperative pain medicine prescription today. All of her questions were asked and answered. I encouraged her to contact the office over the next week or 2 should she develop any additional questions.  Current Plans Instructions: Congratulations on starting your journey to a healthier life! Over the next few weeks you will be undergoing tests (x-rays and labs) and seeing specialists to help evaluate you for weight loss surgery. These tests and consultations with a psychologist and nutritionist are needed to prepare you for the lifestyle changes that lie ahead and are often required by insurance companies to approve you for surgery. Please call me if you have any questions during the evaluation.  Pathway to Surgery:  Two weeks prior to surgery Go on the extremely low carb liquid diet - this will decrease the size of your liver which will make surgery safer - the nutritionist will go over this at a later date Attend preoperative appointment with your surgeon Attend preoperative surgery class  One week prior to surgery No aspirin products. Tylenol is acceptable  24 hours prior to surgery No alcoholic beverages Report fever greater than 100.5 or excessive nasal drainage suggesting infection Continue bariatric preop diet Perform bowel prep if ordered Do not eat or drink anything after midnight the night before surgery Do not take any medications except those instructed by the anesthesiologist  Morning of surgery Please arrive at the hospital at least 2 hours before your scheduled surgery time. No makeup, fingernail polish or  jewelry Bring insurance cards with you Bring your CPAP mask if you use this Started OxyCODONE HCl 5MG/5ML, 5-10 Milliliter every four hours, as needed, 200 Milliliter, 12/24/2014, No Refill.  ESSENTIAL HYPERTENSION (401.9  I10)  OSTEOARTHRITIS OF BOTH KNEES, UNSPECIFIED OSTEOARTHRITIS TYPE (715.96  M17.0)  Migraine Headache  Leighton Ruff. Redmond Pulling, MD, FACS General, Bariatric, & Minimally Invasive Surgery [facility], [place]

## 2014-12-30 ENCOUNTER — Inpatient Hospital Stay (HOSPITAL_COMMUNITY): Payer: Federal, State, Local not specified - PPO | Admitting: Certified Registered Nurse Anesthetist

## 2014-12-30 ENCOUNTER — Inpatient Hospital Stay (HOSPITAL_COMMUNITY)
Admission: RE | Admit: 2014-12-30 | Discharge: 2015-01-04 | DRG: 619 | Disposition: A | Discharge status: Home / Self Care | Payer: Federal, State, Local not specified - PPO | Source: Ambulatory Visit | Attending: General Surgery | Admitting: General Surgery

## 2014-12-30 ENCOUNTER — Encounter (HOSPITAL_COMMUNITY)
Admission: RE | Disposition: A | Discharge status: Home / Self Care | Payer: Federal, State, Local not specified - PPO | Source: Ambulatory Visit | Attending: General Surgery

## 2014-12-30 ENCOUNTER — Encounter (HOSPITAL_COMMUNITY): Payer: Self-pay | Admitting: *Deleted

## 2014-12-30 DIAGNOSIS — J95821 Acute postprocedural respiratory failure: Secondary | ICD-10-CM

## 2014-12-30 DIAGNOSIS — M170 Bilateral primary osteoarthritis of knee: Secondary | ICD-10-CM | POA: Diagnosis present

## 2014-12-30 DIAGNOSIS — Z79899 Other long term (current) drug therapy: Secondary | ICD-10-CM

## 2014-12-30 DIAGNOSIS — Z9049 Acquired absence of other specified parts of digestive tract: Secondary | ICD-10-CM | POA: Diagnosis present

## 2014-12-30 DIAGNOSIS — K449 Diaphragmatic hernia without obstruction or gangrene: Secondary | ICD-10-CM | POA: Diagnosis present

## 2014-12-30 DIAGNOSIS — Z01812 Encounter for preprocedural laboratory examination: Secondary | ICD-10-CM

## 2014-12-30 DIAGNOSIS — J9589 Other postprocedural complications and disorders of respiratory system, not elsewhere classified: Secondary | ICD-10-CM | POA: Diagnosis not present

## 2014-12-30 DIAGNOSIS — F418 Other specified anxiety disorders: Secondary | ICD-10-CM | POA: Diagnosis present

## 2014-12-30 DIAGNOSIS — G43909 Migraine, unspecified, not intractable, without status migrainosus: Secondary | ICD-10-CM | POA: Diagnosis present

## 2014-12-30 DIAGNOSIS — Z800 Family history of malignant neoplasm of digestive organs: Secondary | ICD-10-CM | POA: Diagnosis not present

## 2014-12-30 DIAGNOSIS — J9601 Acute respiratory failure with hypoxia: Secondary | ICD-10-CM

## 2014-12-30 DIAGNOSIS — R110 Nausea: Secondary | ICD-10-CM

## 2014-12-30 DIAGNOSIS — Z6841 Body Mass Index (BMI) 40.0 and over, adult: Secondary | ICD-10-CM

## 2014-12-30 DIAGNOSIS — Z833 Family history of diabetes mellitus: Secondary | ICD-10-CM

## 2014-12-30 DIAGNOSIS — R918 Other nonspecific abnormal finding of lung field: Secondary | ICD-10-CM | POA: Insufficient documentation

## 2014-12-30 DIAGNOSIS — I10 Essential (primary) hypertension: Secondary | ICD-10-CM | POA: Diagnosis present

## 2014-12-30 DIAGNOSIS — Z8249 Family history of ischemic heart disease and other diseases of the circulatory system: Secondary | ICD-10-CM

## 2014-12-30 HISTORY — PX: LAPAROSCOPIC ROUX-EN-Y GASTRIC BYPASS WITH HIATAL HERNIA REPAIR: SHX6513

## 2014-12-30 LAB — HEMOGLOBIN AND HEMATOCRIT, BLOOD
HCT: 42.1 % (ref 36.0–46.0)
Hemoglobin: 13.3 g/dL (ref 12.0–15.0)

## 2014-12-30 SURGERY — CREATION, GASTRIC BYPASS, LAPAROSCOPIC, USING ROUX-EN-Y GASTROENTEROSTOMY, WITH HIATAL HERNIA REPAIR
Anesthesia: General | Site: Abdomen

## 2014-12-30 MED ORDER — PROMETHAZINE HCL 25 MG/ML IJ SOLN
6.2500 mg | INTRAMUSCULAR | Status: DC | PRN
  Administered 2014-12-30: 12.5 mg via INTRAVENOUS

## 2014-12-30 MED ORDER — METOCLOPRAMIDE HCL 5 MG/ML IJ SOLN
5.0000 mg | Freq: Once | INTRAMUSCULAR | Status: AC
  Administered 2014-12-30: 5 mg via INTRAVENOUS

## 2014-12-30 MED ORDER — DEXAMETHASONE SODIUM PHOSPHATE 10 MG/ML IJ SOLN
INTRAMUSCULAR | Status: AC
  Filled 2014-12-30: qty 1

## 2014-12-30 MED ORDER — OXYCODONE HCL 5 MG/5ML PO SOLN
5.0000 mg | ORAL | Status: DC | PRN
  Administered 2014-12-31 – 2015-01-01 (×3): 5 mg via ORAL
  Administered 2015-01-01 – 2015-01-03 (×8): 10 mg via ORAL
  Filled 2014-12-30: qty 10
  Filled 2014-12-30: qty 25
  Filled 2014-12-30 (×3): qty 10
  Filled 2014-12-30: qty 5
  Filled 2014-12-30 (×5): qty 10
  Filled 2014-12-30: qty 5

## 2014-12-30 MED ORDER — CISATRACURIUM BESYLATE (PF) 10 MG/5ML IV SOLN
INTRAVENOUS | Status: DC | PRN
  Administered 2014-12-30: 10 mg via INTRAVENOUS
  Administered 2014-12-30: 6 mg via INTRAVENOUS
  Administered 2014-12-30: 2 mg via INTRAVENOUS
  Administered 2014-12-30: 4 mg via INTRAVENOUS
  Administered 2014-12-30: 2 mg via INTRAVENOUS
  Administered 2014-12-30 (×2): 4 mg via INTRAVENOUS
  Administered 2014-12-30 (×2): 2 mg via INTRAVENOUS

## 2014-12-30 MED ORDER — BUPIVACAINE-EPINEPHRINE 0.25% -1:200000 IJ SOLN
INTRAMUSCULAR | Status: DC | PRN
  Administered 2014-12-30: 15 mL

## 2014-12-30 MED ORDER — SUCCINYLCHOLINE CHLORIDE 20 MG/ML IJ SOLN
INTRAMUSCULAR | Status: DC | PRN
  Administered 2014-12-30: 100 mg via INTRAVENOUS
  Administered 2014-12-30: 200 mg via INTRAVENOUS

## 2014-12-30 MED ORDER — MIDAZOLAM HCL 2 MG/2ML IJ SOLN
INTRAMUSCULAR | Status: AC
  Filled 2014-12-30: qty 2

## 2014-12-30 MED ORDER — ONDANSETRON HCL 4 MG/2ML IJ SOLN
INTRAMUSCULAR | Status: AC
  Filled 2014-12-30: qty 2

## 2014-12-30 MED ORDER — MEPERIDINE HCL 50 MG/ML IJ SOLN: 6.2500 mg | INTRAMUSCULAR | Status: DC | PRN

## 2014-12-30 MED ORDER — PROPOFOL 10 MG/ML IV BOLUS
INTRAVENOUS | Status: DC | PRN
  Administered 2014-12-30: 300 mg via INTRAVENOUS
  Administered 2014-12-30: 100 mg via INTRAVENOUS

## 2014-12-30 MED ORDER — EPHEDRINE SULFATE 50 MG/ML IJ SOLN
INTRAMUSCULAR | Status: AC
  Filled 2014-12-30: qty 1

## 2014-12-30 MED ORDER — ACETAMINOPHEN 10 MG/ML IV SOLN
1000.0000 mg | Freq: Four times a day (QID) | INTRAVENOUS | Status: AC
  Administered 2014-12-30 – 2014-12-31 (×4): 1000 mg via INTRAVENOUS
  Filled 2014-12-30 (×4): qty 100

## 2014-12-30 MED ORDER — LACTATED RINGERS IR SOLN
Status: DC | PRN
  Administered 2014-12-30: 1000 mL

## 2014-12-30 MED ORDER — ONDANSETRON HCL 4 MG/2ML IJ SOLN
4.0000 mg | INTRAMUSCULAR | Status: DC | PRN
  Administered 2014-12-30 – 2014-12-31 (×5): 4 mg via INTRAVENOUS
  Filled 2014-12-30 (×5): qty 2

## 2014-12-30 MED ORDER — PROMETHAZINE HCL 25 MG/ML IJ SOLN
12.5000 mg | Freq: Four times a day (QID) | INTRAMUSCULAR | Status: DC | PRN
  Administered 2014-12-30: 12.5 mg via INTRAVENOUS
  Filled 2014-12-30: qty 1

## 2014-12-30 MED ORDER — LEVOFLOXACIN IN D5W 750 MG/150ML IV SOLN
750.0000 mg | INTRAVENOUS | Status: AC
  Administered 2014-12-30: 750 mg via INTRAVENOUS
  Filled 2014-12-30: qty 150

## 2014-12-30 MED ORDER — EVICEL 5 ML EX KIT
PACK | CUTANEOUS | Status: DC | PRN
  Administered 2014-12-30: 10 mL

## 2014-12-30 MED ORDER — ACETAMINOPHEN 160 MG/5ML PO SOLN: 650.0000 mg | ORAL | Status: DC | PRN

## 2014-12-30 MED ORDER — PROMETHAZINE HCL 25 MG/ML IJ SOLN
INTRAMUSCULAR | Status: AC
  Filled 2014-12-30: qty 1

## 2014-12-30 MED ORDER — UNJURY CHICKEN SOUP POWDER
2.0000 [oz_av] | Freq: Four times a day (QID) | ORAL | Status: DC
  Administered 2015-01-03 – 2015-01-04 (×3): 2 [oz_av] via ORAL
  Filled 2014-12-30 (×4): qty 27

## 2014-12-30 MED ORDER — MORPHINE SULFATE 2 MG/ML IJ SOLN
2.0000 mg | INTRAMUSCULAR | Status: DC | PRN
  Administered 2014-12-30 – 2014-12-31 (×5): 2 mg via INTRAVENOUS
  Filled 2014-12-30 (×5): qty 1

## 2014-12-30 MED ORDER — SODIUM CHLORIDE 0.9 % IJ SOLN
INTRAMUSCULAR | Status: AC
  Filled 2014-12-30: qty 50

## 2014-12-30 MED ORDER — METOCLOPRAMIDE HCL 5 MG/ML IJ SOLN
INTRAMUSCULAR | Status: AC
Start: 2014-12-30 — End: 2014-12-31
  Filled 2014-12-30: qty 2

## 2014-12-30 MED ORDER — FENTANYL CITRATE 0.05 MG/ML IJ SOLN: 25.0000 ug | INTRAMUSCULAR | Status: DC | PRN

## 2014-12-30 MED ORDER — NEOSTIGMINE METHYLSULFATE 10 MG/10ML IV SOLN
INTRAVENOUS | Status: DC | PRN
  Administered 2014-12-30: 5 mg via INTRAVENOUS

## 2014-12-30 MED ORDER — FENTANYL CITRATE 0.05 MG/ML IJ SOLN
INTRAMUSCULAR | Status: DC | PRN
  Administered 2014-12-30 (×8): 50 ug via INTRAVENOUS

## 2014-12-30 MED ORDER — UNJURY CHOCOLATE CLASSIC POWDER
2.0000 [oz_av] | Freq: Four times a day (QID) | ORAL | Status: DC
  Administered 2015-01-01 – 2015-01-04 (×10): 2 [oz_av] via ORAL
  Filled 2014-12-30 (×4): qty 27

## 2014-12-30 MED ORDER — LACTATED RINGERS IV SOLN
INTRAVENOUS | Status: DC | PRN
  Administered 2014-12-30 (×4): via INTRAVENOUS

## 2014-12-30 MED ORDER — HYDROMORPHONE HCL 1 MG/ML IJ SOLN
0.2500 mg | INTRAMUSCULAR | Status: DC | PRN
  Administered 2014-12-30 (×5): 0.25 mg via INTRAVENOUS

## 2014-12-30 MED ORDER — PANTOPRAZOLE SODIUM 40 MG IV SOLR
40.0000 mg | Freq: Every day | INTRAVENOUS | Status: DC
  Administered 2014-12-30 – 2015-01-03 (×5): 40 mg via INTRAVENOUS
  Filled 2014-12-30 (×6): qty 40

## 2014-12-30 MED ORDER — LIDOCAINE HCL (CARDIAC) 20 MG/ML IV SOLN
INTRAVENOUS | Status: DC | PRN
  Administered 2014-12-30: 100 mg via INTRAVENOUS

## 2014-12-30 MED ORDER — PROPOFOL 10 MG/ML IV BOLUS
INTRAVENOUS | Status: AC
  Filled 2014-12-30: qty 20

## 2014-12-30 MED ORDER — CETYLPYRIDINIUM CHLORIDE 0.05 % MT LIQD
7.0000 mL | Freq: Two times a day (BID) | OROMUCOSAL | Status: DC
  Administered 2014-12-30 – 2015-01-04 (×8): 7 mL via OROMUCOSAL

## 2014-12-30 MED ORDER — MIDAZOLAM HCL 5 MG/5ML IJ SOLN
INTRAMUSCULAR | Status: DC | PRN
  Administered 2014-12-30: 2 mg via INTRAVENOUS

## 2014-12-30 MED ORDER — METOPROLOL TARTRATE 1 MG/ML IV SOLN
5.0000 mg | Freq: Four times a day (QID) | INTRAVENOUS | Status: DC
  Administered 2014-12-30 – 2014-12-31 (×4): 5 mg via INTRAVENOUS
  Filled 2014-12-30 (×4): qty 5

## 2014-12-30 MED ORDER — HYDROMORPHONE HCL 1 MG/ML IJ SOLN
INTRAMUSCULAR | Status: AC
  Filled 2014-12-30: qty 1

## 2014-12-30 MED ORDER — PHENYLEPHRINE 40 MCG/ML (10ML) SYRINGE FOR IV PUSH (FOR BLOOD PRESSURE SUPPORT)
PREFILLED_SYRINGE | INTRAVENOUS | Status: AC
  Filled 2014-12-30: qty 20

## 2014-12-30 MED ORDER — TISSEEL VH 10 ML EX KIT
PACK | CUTANEOUS | Status: AC
  Filled 2014-12-30: qty 2

## 2014-12-30 MED ORDER — ACETAMINOPHEN 10 MG/ML IV SOLN
1000.0000 mg | Freq: Once | INTRAVENOUS | Status: AC
  Administered 2014-12-30: 1000 mg via INTRAVENOUS
  Filled 2014-12-30: qty 100

## 2014-12-30 MED ORDER — FENTANYL CITRATE 0.05 MG/ML IJ SOLN
INTRAMUSCULAR | Status: AC
  Filled 2014-12-30: qty 5

## 2014-12-30 MED ORDER — UNJURY VANILLA POWDER
2.0000 [oz_av] | Freq: Four times a day (QID) | ORAL | Status: DC
  Administered 2015-01-01: 2 [oz_av] via ORAL
  Filled 2014-12-30 (×4): qty 27

## 2014-12-30 MED ORDER — HEPARIN SODIUM (PORCINE) 5000 UNIT/ML IJ SOLN
5000.0000 [IU] | INTRAMUSCULAR | Status: AC
  Administered 2014-12-30: 5000 [IU] via SUBCUTANEOUS
  Filled 2014-12-30: qty 1

## 2014-12-30 MED ORDER — NEOSTIGMINE METHYLSULFATE 10 MG/10ML IV SOLN
INTRAVENOUS | Status: AC
  Filled 2014-12-30: qty 1

## 2014-12-30 MED ORDER — METOCLOPRAMIDE HCL 5 MG/ML IJ SOLN
5.0000 mg | Freq: Three times a day (TID) | INTRAMUSCULAR | Status: DC | PRN
  Administered 2014-12-30: 5 mg via INTRAVENOUS
  Filled 2014-12-30: qty 2

## 2014-12-30 MED ORDER — GLYCOPYRROLATE 0.2 MG/ML IJ SOLN
INTRAMUSCULAR | Status: AC
  Filled 2014-12-30: qty 3

## 2014-12-30 MED ORDER — KCL IN DEXTROSE-NACL 20-5-0.45 MEQ/L-%-% IV SOLN
INTRAVENOUS | Status: DC
  Administered 2014-12-30 – 2014-12-31 (×4): via INTRAVENOUS
  Administered 2015-01-01: 125 mL/h via INTRAVENOUS
  Administered 2015-01-01 – 2015-01-02 (×2): via INTRAVENOUS
  Filled 2014-12-30 (×12): qty 1000

## 2014-12-30 MED ORDER — DEXAMETHASONE SODIUM PHOSPHATE 10 MG/ML IJ SOLN
INTRAMUSCULAR | Status: DC | PRN
  Administered 2014-12-30: 10 mg via INTRAVENOUS

## 2014-12-30 MED ORDER — GLYCOPYRROLATE 0.2 MG/ML IJ SOLN
INTRAMUSCULAR | Status: DC | PRN
  Administered 2014-12-30: 0.2 mg via INTRAVENOUS
  Administered 2014-12-30: .8 mg via INTRAVENOUS

## 2014-12-30 MED ORDER — ENOXAPARIN SODIUM 30 MG/0.3ML ~~LOC~~ SOLN
30.0000 mg | Freq: Two times a day (BID) | SUBCUTANEOUS | Status: DC
  Administered 2014-12-31 – 2015-01-01 (×3): 30 mg via SUBCUTANEOUS
  Filled 2014-12-30 (×3): qty 0.3

## 2014-12-30 MED ORDER — BUPIVACAINE-EPINEPHRINE (PF) 0.25% -1:200000 IJ SOLN
INTRAMUSCULAR | Status: AC
  Filled 2014-12-30: qty 30

## 2014-12-30 MED ORDER — CISATRACURIUM BESYLATE 20 MG/10ML IV SOLN
INTRAVENOUS | Status: AC
  Filled 2014-12-30: qty 10

## 2014-12-30 MED ORDER — BUPIVACAINE LIPOSOME 1.3 % IJ SUSP
20.0000 mL | Freq: Once | INTRAMUSCULAR | Status: AC
  Administered 2014-12-30: 20 mL
  Filled 2014-12-30: qty 20

## 2014-12-30 MED ORDER — ATROPINE SULFATE 0.4 MG/ML IJ SOLN
INTRAMUSCULAR | Status: AC
  Filled 2014-12-30: qty 1

## 2014-12-30 MED ORDER — SODIUM CHLORIDE 0.9 % IJ SOLN
INTRAMUSCULAR | Status: AC
  Filled 2014-12-30: qty 10

## 2014-12-30 MED ORDER — ENALAPRILAT 1.25 MG/ML IV SOLN
1.2500 mg | Freq: Four times a day (QID) | INTRAVENOUS | Status: DC | PRN
  Administered 2014-12-30 – 2014-12-31 (×3): 1.25 mg via INTRAVENOUS
  Filled 2014-12-30 (×5): qty 1

## 2014-12-30 MED ORDER — ACETAMINOPHEN 160 MG/5ML PO SOLN: 325.0000 mg | ORAL | Status: DC | PRN

## 2014-12-30 MED ORDER — LIDOCAINE HCL (CARDIAC) 20 MG/ML IV SOLN
INTRAVENOUS | Status: AC
  Filled 2014-12-30: qty 5

## 2014-12-30 MED ORDER — 0.9 % SODIUM CHLORIDE (POUR BTL) OPTIME
TOPICAL | Status: DC | PRN
  Administered 2014-12-30: 1000 mL

## 2014-12-30 MED ORDER — EPHEDRINE SULFATE 50 MG/ML IJ SOLN
INTRAMUSCULAR | Status: DC | PRN
  Administered 2014-12-30 (×2): 5 mg via INTRAVENOUS

## 2014-12-30 MED ORDER — ONDANSETRON HCL 4 MG/2ML IJ SOLN
INTRAMUSCULAR | Status: DC | PRN
  Administered 2014-12-30: 4 mg via INTRAVENOUS

## 2014-12-30 MED ORDER — ONDANSETRON HCL 4 MG/2ML IJ SOLN
4.0000 mg | Freq: Once | INTRAMUSCULAR | Status: AC
  Administered 2014-12-30: 4 mg via INTRAVENOUS

## 2014-12-30 SURGICAL SUPPLY — 55 items
APPLICATOR COTTON TIP 6IN STRL (MISCELLANEOUS) IMPLANT
BLADE SURG SZ11 CARB STEEL (BLADE) ×3 IMPLANT
CABLE HIGH FREQUENCY MONO STRZ (ELECTRODE) IMPLANT
CHLORAPREP W/TINT 26ML (MISCELLANEOUS) ×6 IMPLANT
CLIP SUT LAPRA TY ABSORB (SUTURE) ×6 IMPLANT
DEVICE SUTURE ENDOST 10MM (ENDOMECHANICALS) ×3 IMPLANT
DRAIN PENROSE 18X1/4 LTX STRL (WOUND CARE) ×3 IMPLANT
DRAPE CAMERA CLOSED 9X96 (DRAPES) ×3 IMPLANT
ELECT REM PT RETURN 9FT ADLT (ELECTROSURGICAL) ×3
ELECTRODE REM PT RTRN 9FT ADLT (ELECTROSURGICAL) ×2 IMPLANT
GAUZE SPONGE 4X4 12PLY STRL (GAUZE/BANDAGES/DRESSINGS) IMPLANT
GAUZE SPONGE 4X4 16PLY XRAY LF (GAUZE/BANDAGES/DRESSINGS) ×3 IMPLANT
GLOVE BIOGEL M STRL SZ7.5 (GLOVE) IMPLANT
GOWN STRL REUS W/TWL XL LVL3 (GOWN DISPOSABLE) ×18 IMPLANT
HOVERMATT SINGLE USE (MISCELLANEOUS) ×3 IMPLANT
KIT BASIN OR (CUSTOM PROCEDURE TRAY) ×3 IMPLANT
KIT GASTRIC LAVAGE 34FR ADT (SET/KITS/TRAYS/PACK) ×3 IMPLANT
LIQUID BAND (GAUZE/BANDAGES/DRESSINGS) IMPLANT
NEEDLE SPNL 22GX3.5 QUINCKE BK (NEEDLE) ×3 IMPLANT
PACK CARDIOVASCULAR III (CUSTOM PROCEDURE TRAY) ×3 IMPLANT
PEN SKIN MARKING BROAD (MISCELLANEOUS) ×3 IMPLANT
RELOAD 45 VASCULAR/THIN (ENDOMECHANICALS) ×3 IMPLANT
RELOAD ENDO STITCH 2.0 (ENDOMECHANICALS) ×14
RELOAD STAPLE TA45 3.5 REG BLU (ENDOMECHANICALS) ×3 IMPLANT
RELOAD STAPLER BLUE 60MM (STAPLE) ×10 IMPLANT
RELOAD STAPLER GOLD 60MM (STAPLE) ×2 IMPLANT
RELOAD STAPLER WHITE 60MM (STAPLE) ×2 IMPLANT
SCISSORS LAP 5X35 DISP (ENDOMECHANICALS) ×3 IMPLANT
SEALANT SURGICAL APPL DUAL CAN (MISCELLANEOUS) ×3 IMPLANT
SET IRRIG TUBING LAPAROSCOPIC (IRRIGATION / IRRIGATOR) ×3 IMPLANT
SHEARS HARMONIC ACE PLUS 45CM (MISCELLANEOUS) ×3 IMPLANT
SLEEVE ADV FIXATION 12X100MM (TROCAR) ×6 IMPLANT
SLEEVE XCEL OPT CAN 5 100 (ENDOMECHANICALS) IMPLANT
SOLUTION ANTI FOG 6CC (MISCELLANEOUS) ×3 IMPLANT
STAPLER RELOAD BLUE 60MM (STAPLE) ×15
STAPLER RELOAD GOLD 60MM (STAPLE) ×3
STAPLER RELOAD WHITE 60MM (STAPLE) ×3
STAPLER VISISTAT 35W (STAPLE) IMPLANT
SUT MNCRL AB 4-0 PS2 18 (SUTURE) ×3 IMPLANT
SUT RELOAD ENDO STITCH 2 48X1 (ENDOMECHANICALS) ×12
SUT RELOAD ENDO STITCH 2.0 (ENDOMECHANICALS) ×8
SUT VIC AB 2-0 SH 27 (SUTURE) ×1
SUT VIC AB 2-0 SH 27X BRD (SUTURE) ×2 IMPLANT
SUTURE RELOAD END STTCH 2 48X1 (ENDOMECHANICALS) ×12 IMPLANT
SUTURE RELOAD ENDO STITCH 2.0 (ENDOMECHANICALS) ×8 IMPLANT
SYR 20CC LL (SYRINGE) ×6 IMPLANT
TOWEL OR 17X26 10 PK STRL BLUE (TOWEL DISPOSABLE) ×3 IMPLANT
TOWEL OR NON WOVEN STRL DISP B (DISPOSABLE) ×3 IMPLANT
TRAY FOLEY CATH 14FRSI W/METER (CATHETERS) ×3 IMPLANT
TROCAR ADV FIXATION 12X100MM (TROCAR) ×3 IMPLANT
TROCAR ADV FIXATION 5X100MM (TROCAR) ×6 IMPLANT
TROCAR BLADELESS OPT 5 100 (ENDOMECHANICALS) ×3 IMPLANT
TROCAR XCEL 12X100 BLDLESS (ENDOMECHANICALS) ×3 IMPLANT
TUBING ENDO SMARTCAP PENTAX (MISCELLANEOUS) ×3 IMPLANT
TUBING FILTER THERMOFLATOR (ELECTROSURGICAL) ×3 IMPLANT

## 2014-12-30 NOTE — Interval H&P Note (Signed)
History and Physical Interval Note:  12/30/2014 7:05 AM  Bobbye Riggsenise B Nehme  has presented today for surgery, with the diagnosis of morbid obesity and hiatal hernia  The various methods of treatment have been discussed with the patient and family. After consideration of risks, benefits and other options for treatment, the patient has consented to  Procedure(s): LAPAROSCOPIC ROUX-EN-Y GASTRIC BYPASS WITH HIATAL HERNIA REPAIR (N/A) INSERTION OF MESH (N/A) as a surgical intervention .  The patient's history has been reviewed, patient examined, no change in status, stable for surgery.  I have reviewed the patient's chart and labs.  Questions were answered to the patient's satisfaction.    Mary SellaEric M. [name], MD, FACS General, Bariatric, & Minimally Invasive Surgery [facility], [place]    [facility] Institute LLCWILSON,Mccall Lomax M

## 2014-12-30 NOTE — Anesthesia Preprocedure Evaluation (Addendum)
Anesthesia Evaluation  Patient identified by MRN, date of birth, ID band Patient awake    Reviewed: Allergy & Precautions, NPO status , Patient's Chart, lab work & pertinent test results, reviewed documented beta blocker date and time   Airway Mallampati: II  TM Distance: >3 FB Neck ROM: Full    Dental no notable dental hx.    Pulmonary pneumonia -, resolved,  breath sounds clear to auscultation  Pulmonary exam normal       Cardiovascular hypertension, Pt. on medications and Pt. on home beta blockers Rhythm:Regular Rate:Normal     Neuro/Psych  Headaches, PSYCHIATRIC DISORDERS Anxiety Depression    GI/Hepatic Neg liver ROS, hiatal hernia,   Endo/Other  negative endocrine ROS  Renal/GU negative Renal ROS     Musculoskeletal  (+) Arthritis -,   Abdominal   Peds  Hematology negative hematology ROS (+) anemia ,   Anesthesia Other Findings   Reproductive/Obstetrics negative OB ROS                            Anesthesia Physical Anesthesia Plan  ASA: IV  Anesthesia Plan: General   Post-op Pain Management:    Induction: Intravenous  Airway Management Planned: Oral ETT  Additional Equipment: None  Intra-op Plan:   Post-operative Plan: Extubation in OR  Informed Consent: I have reviewed the patients History and Physical, chart, labs and discussed the procedure including the risks, benefits and alternatives for the proposed anesthesia with the patient or authorized representative who has indicated his/her understanding and acceptance.   Dental advisory given  Plan Discussed with: CRNA  Anesthesia Plan Comments:         Anesthesia Quick Evaluation

## 2014-12-30 NOTE — Op Note (Signed)
[name] [id number] 17-Dec-1965. 12/30/2014  Preoperative diagnosis:  1. Morbid obesity (BMI 66)  2. Hypertension 3. Osteoarthritis b/l knees 4.  Migraine headaches  Postoperative  diagnosis:  1. same  Surgical procedure: Laparoscopic Roux-en-Y gastric bypass (ante-colic, ante-gastric); Hiatal hernia repair upper endoscopy  Surgeon: [name], M.D. FACS  Asst.: Luretha Murphy, MD FACS  Anesthesia: General plus 50 cc exparel  Complications: None   EBL: Minimal   Drains: None   Disposition: PACU in good condition   Indications for procedure: 48yo WF with morbid obesity who has been unsuccessful at sustained weight loss. The patient's comorbidities are listed above. We discussed the risk and benefits of surgery including but not limited to anesthesia risk, bleeding, infection, blood clot formation, anastomotic leak, anastomotic stricture, ulcer formation, death, respiratory complications, intestinal blockage, internal hernia, gallstone formation, vitamin and nutritional deficiencies, injury to surrounding structures, failure to lose weight and mood changes.   Description of procedure: Patient is brought to the operating room and general anesthesia induced. The patient had received preoperative broad-spectrum IV antibiotics and subcutaneous heparin. The abdomen was widely sterilely prepped with Chloraprep and draped. Patient timeout was performed and correct patient and procedure confirmed. Access was obtained with a 12 mm Optiview trocar in the left upper quadrant and pneumoperitoneum established without difficulty. Under direct vision 12 mm trocars were placed laterally in the right upper quadrant, right upper quadrant midclavicular line, and to the left and above the umbilicus for the camera port. A 5 mm trocar was placed laterally in the left upper quadrant.  The omentum was brought into the upper abdomen and the transverse mesocolon elevated and the ligament of Treitz clearly  identified. A 40 cm biliopancreatic limb was then carefully measured from the ligament of Treitz. The small intestine was divided at this point with a single firing of the white load linear stapler. A Penrose drain was sutured to the end of the Roux-en-Y limb for later identification. A 100 cm Roux-en-Y limb was then carefully measured. At this point a side-to-side anastomosis was created between the Roux limb and the end of the biliopancreatic limb. This was accomplished with a single firing of the 45 mm white load linear stapler. The common enterotomy was closed with a running 2-0 Vicryl begun at either end of the enterotomy and tied centrally. Tisseel tissue sealant was placed over the anastomosis. The mesenteric defect was then closed with running 2-0 silk. The omentum was then divided with the harmonic scalpel up towards the transverse colon to allow mobility of the Roux limb toward the gastric pouch. The patient was then placed in steep reversed Trendelenburg. Through a 5 mm subxiphoid site the [facility] retractor was placed and the left lobe of the liver elevated with excellent exposure of the upper stomach and hiatus.  It was obvious that the patient had a hiatal hernia. She clearly had a defect with perigastric fat herniating the diaphragm. The gastrohepatic ligament was incised with harmonic scalpel. The right crus was identified. We identified the crossing fat along the right crus. The adipose tissue just above this area was incised with harmonic scalpel. I then bluntly dissected out this area and identified the left crus. There was evidence of a hiatal hernia. I then mobilized the esophagus. The left and right crus were further mobilized with blunt dissection. I was then able to reapproximate the left and right crus with 0 Ethibond using an Endostitch suture device and securing it with a titanium tyknot. I placed a  second suture in a similar fashion.The angle of Hiss was then mobilized with the harmonic  scalpel. A 4 cm gastric pouch was then carefully measured along the lesser curve of the stomach. Dissection was carried along the lesser curve at this point with the Harmonic scalpel working carefully back toward the lesser sac at right angles to the lesser curve. The free lesser sac was then entered. After being sure all tubes were removed from the stomach an initial firing of the gold load 60 mm linear stapler was fired at right angles across the lesser curve for about 4 cm. The gastric pouch was further mobilized posteriorly and then the pouch was completed with 3 further firings of the 60 mm blue load linear stapler up through the previously dissected angle of His. It was ensured that the pouch was completely mobilized away from the gastric remnant. This created a nice tubular 4-5 cm gastric pouch.  The Roux limb was then brought up in an antecolic fashion with the candycane facing to the patient's left without undue tension. The gastrojejunostomy was created with an initial posterior row of 2-0 Vicryl between the Roux limb and the staple line of the gastric pouch. Enterotomies were then made in the gastric pouch and the Roux limb with the harmonic scalpel and at approximately 2-2-1/2 cm anastomosis was created with a single firing of the 60mm blue load linear stapler (partially within the defect to create a 2.5cm anastomosis). The staple line was inspected and was intact without bleeding. The common enterotomy was then closed with running 2-0 Vicryl begun at either end and tied centrally. The Ewall tube was then easily passed through the anastomosis and an outer anterior layer of running 2-0 Vicryl was placed. The Ewald tube was removed. With the outlet of the gastrojejunostomy clamped and under saline irrigation the assistant performed upper endoscopy and with the gastric pouch tensely distended with air-there was no evidence of leak on this test. The pouch was desufflated. The Vonita Mosseterson defect was closed with  running 2-0 silk. The abdomen was inspected for any evidence of bleeding or bowel injury and everything looked fine. The Nathanson retractor was removed under direct vision after coating the anastomosis with Tisseel tissue sealant. All CO2 was evacuated and trochars removed. Skin incisions were closed with 4-0 monocryl in a subcuticular fashion followed by Dermabond. Sponge needle and instrument counts were correct. The patient was taken to the PACU in good condition.    Tamara SellaEric M. [name], MD, FACS General, Bariatric, & Minimally Invasive Surgery [facility], [place]

## 2014-12-30 NOTE — H&P (View-Only) (Signed)
[name] 12/24/2014 10:23 AM Location: [facility] Patient #: [id number] DOB: 1966/05/11 Married / Language: Cleophus Molt / Race: White Female History of Present Illness Randall Hiss M. Audre Cenci MD; 12/26/2014 8:05 AM) Patient words: Per Op [facility].  The patient is a 49 year old female who presents for a pre-op visit. She is currently scheduled to undergo laparoscopic Roux-en-Y gastric bypass next month. I initially met her on September 24 for consideration for weight loss surgery. She underwent physician supervised weight loss and has undergone our bariatric surgery evaluation and no significant issues have been identified and she has been currently approved. She does have somewhat of a chronic leukocytosis infiltrating her labs over the past several years. She is otherwise asymptomatic. Her upper GI showed a small hiatal hernia. Her EKG was normal. She denies any changes to her medical history since she was initially seen. Her initial visit weight in September was 452 pounds with a BMI of 66.81 however her height was recorded incorrectly. She has started her preoperative diet and has lost some weight.  A comprehensive 12 point ROS was performed and is unchanged from 07/2014. Other Problems Gayland Curry, MD; 12/26/2014 8:05 AM) Migraine Headache OSTEOARTHRITIS OF BOTH KNEES, UNSPECIFIED OSTEOARTHRITIS TYPE (715.96  M17.0) ESSENTIAL HYPERTENSION (401.9  I10) OBESITY, MORBID, BMI 50 OR HIGHER (278.01  E66.01)  Past Surgical History Gayland Curry, MD; 12/26/2014 8:05 AM) Appendectomy Cesarean Section - 1 Gallbladder Surgery - Laparoscopic Oral Surgery Tonsillectomy  Diagnostic Studies History Gayland Curry, MD; 12/26/2014 8:05 AM) Colonoscopy never Mammogram within last year Pap Smear 1-5 years ago  Allergies Festus Holts, LPN; 2/72/5366 44:03 AM) Penicillins Darvocet A500 *ANALGESICS - OPIOID*  Medication History Gayland Curry, MD; 12/26/2014 8:05 AM) PROzac (20MG  Capsule, Oral) Active. Iron Combinations (Oral) Active. Metoprolol Succinate ER (100MG Tablet ER 24HR, Oral two times daily) Active. Vestura (3-0.02MG Tablet, Oral) Active. Vitamin D (Ergocalciferol) (50000UNIT Capsule, Oral) Active. Multivitamins (Oral) Active. OxyCODONE HCl (5MG/5ML Solution, 5-10 Milliliter Oral every four hours, as needed, Taken starting 12/24/2014) Active. PROzac (10MG Capsule, Oral) Active.  Social History Gayland Curry, MD; 12/26/2014 8:05 AM) No drug use Tobacco use Never smoker. No alcohol use Caffeine use Carbonated beverages, Tea.  Family History Gayland Curry, MD; 12/26/2014 8:05 AM) Diabetes Mellitus Father. Hypertension Brother, Father, Mother. Arthritis Father, Mother. Colon Cancer Father.  Pregnancy / Birth History Gayland Curry, MD; 12/26/2014 8:05 AM) Contraceptive History Oral contraceptives. Gravida 2 Maternal age 108-40 Age at menarche 44 years. Para 1    Vitals Festus Holts LPN; 4/74/2595 63:87 AM) 12/24/2014 10:24 AM Weight: 445 lb Height: 66.5in Body Surface Area: 3.08 m Body Mass Index: 70.75 kg/m Temp.: 98.77F(Temporal)  Pulse: 84 (Regular)  Resp.: 24 (Unlabored)  BP: 132/86 (Sitting, Left Arm, Standard)     Physical Exam Randall Hiss M. Sonnie Pawloski MD; 12/26/2014 8:00 AM)  The physical exam findings are as follows: Note:MORBIDILY OBESE; PEAR SHAPED;  General Mental Status-Alert. General Appearance-Consistent with stated age. Hydration-Well hydrated. Voice-Normal.  Head and Neck Head-normocephalic, atraumatic with no lesions or palpable masses. Trachea-midline. Thyroid Gland Characteristics - normal size and consistency.  Eye Eyeball - Bilateral-Extraocular movements intact. Sclera/Conjunctiva - Bilateral-No scleral icterus.  Chest and Lung Exam Chest and lung exam reveals -quiet, even and easy respiratory effort with no use of accessory muscles and on auscultation,  normal breath sounds, no adventitious sounds and normal vocal resonance. Inspection Chest Wall - Normal. Back - normal.  Breast Breast - Left-Symmetric, Non Tender, No Biopsy scars,  no Dimpling, No Inflammation, No Lumpectomy scars, No Mastectomy scars, No Peau d' Orange. Breast - Right-Symmetric, Non Tender, No Biopsy scars, no Dimpling, No Inflammation, No Lumpectomy scars, No Mastectomy scars, No Peau d' Orange. Breast Lump-No Palpable Breast Mass.  Cardiovascular Cardiovascular examination reveals -normal heart sounds, regular rate and rhythm with no murmurs and normal pedal pulses bilaterally.  Abdomen Inspection Inspection of the abdomen reveals - No Hernias. Skin - Scar - Right Lower Quadrant(OLD APPY). Palpation/Percussion Palpation and Percussion of the abdomen reveal - Soft, Non Tender, No Rebound tenderness, No Rigidity (guarding) and No hepatosplenomegaly. Auscultation Auscultation of the abdomen reveals - Bowel sounds normal.  Neurologic Neurologic evaluation reveals -alert and oriented x 3 with no impairment of recent or remote memory. Mental Status-Normal.  Musculoskeletal Normal Exam - Left-Upper Extremity Strength Normal and Lower Extremity Strength Normal. Normal Exam - Right-Upper Extremity Strength Normal and Lower Extremity Strength Normal.  Lymphatic Head & Neck  General Head & Neck Lymphatics: Bilateral - Description - Normal. Axillary  General Axillary Region: Bilateral - Description - Normal. Tenderness - Non Tender. Femoral & Inguinal  Generalized Femoral & Inguinal Lymphatics: Bilateral - Description - Normal. Tenderness - Non Tender.    Assessment & Plan Randall Hiss M. Idy Rawling MD; 12/26/2014 8:04 AM)  OBESITY, MORBID, BMI 50 OR HIGHER (278.01  E66.01) Impression: We reviewed her workup today. We discussed the findings of a small hiatal hernia. She doesn't really have symptoms of reflux. I explained that we would test for hiatal hernia  intraoperatively. If there was a clinically significant hiatal hernia that I would recommend repairing it at the same time. We discussed with hiatal hernia repair would involve. We also discussed the typical hospitalization and postoperative course. We discussed the importance of the preoperative diet. We discussed the preoperative bowel prep as well. She was given her postoperative pain medicine prescription today. All of her questions were asked and answered. I encouraged her to contact the office over the next week or 2 should she develop any additional questions.  Current Plans Instructions: Congratulations on starting your journey to a healthier life! Over the next few weeks you will be undergoing tests (x-rays and labs) and seeing specialists to help evaluate you for weight loss surgery. These tests and consultations with a psychologist and nutritionist are needed to prepare you for the lifestyle changes that lie ahead and are often required by insurance companies to approve you for surgery. Please call me if you have any questions during the evaluation.  Pathway to Surgery:  Two weeks prior to surgery Go on the extremely low carb liquid diet - this will decrease the size of your liver which will make surgery safer - the nutritionist will go over this at a later date Attend preoperative appointment with your surgeon Attend preoperative surgery class  One week prior to surgery No aspirin products. Tylenol is acceptable  24 hours prior to surgery No alcoholic beverages Report fever greater than 100.5 or excessive nasal drainage suggesting infection Continue bariatric preop diet Perform bowel prep if ordered Do not eat or drink anything after midnight the night before surgery Do not take any medications except those instructed by the anesthesiologist  Morning of surgery Please arrive at the hospital at least 2 hours before your scheduled surgery time. No makeup, fingernail polish or  jewelry Bring insurance cards with you Bring your CPAP mask if you use this Started OxyCODONE HCl 5MG/5ML, 5-10 Milliliter every four hours, as needed, 200 Milliliter, 12/24/2014, No Refill.  ESSENTIAL HYPERTENSION (401.9  I10)  OSTEOARTHRITIS OF BOTH KNEES, UNSPECIFIED OSTEOARTHRITIS TYPE (715.96  M17.0)  Migraine Headache  Leighton Ruff. Redmond Pulling, MD, FACS General, Bariatric, & Minimally Invasive Surgery [facility], [place]

## 2014-12-30 NOTE — Transfer of Care (Signed)
Immediate Anesthesia Transfer of Care Note  Patient: Tamara RiggsDenise B Weisheit  Procedure(s) Performed: Procedure(s) (LRB): LAPAROSCOPIC ROUX-EN-Y GASTRIC BYPASS WITH HIATAL HERNIA REPAIR AND UPPER ENDOSCOPY (N/A)  Patient Location: PACU  Anesthesia Type: General  Level of Consciousness: sedated, patient cooperative and responds to stimulation  Airway & Oxygen Therapy: Patient Spontanous Breathing and Patient connected to face mask oxgen  Post-op Assessment: Report given to PACU RN and Post -op Vital signs reviewed and stable  Post vital signs: Reviewed and stable  Complications: No apparent anesthesia complications

## 2014-12-30 NOTE — Anesthesia Postprocedure Evaluation (Signed)
Anesthesia Post Note  Patient: Bobbye RiggsDenise B Sienkiewicz  Procedure(s) Performed: Procedure(s) (LRB): LAPAROSCOPIC ROUX-EN-Y GASTRIC BYPASS WITH HIATAL HERNIA REPAIR AND UPPER ENDOSCOPY (N/A)  Anesthesia type: General  Patient location: PACU  Post pain: Pain level controlled  Post assessment: Post-op Vital signs reviewed  Last Vitals: BP 194/71 mmHg  Pulse 82  Temp(Src) 36.7 C (Oral)  Resp 21  Ht 5\' 9"  (1.753 m)  Wt 442 lb 6 oz (200.66 kg)  BMI 65.30 kg/m2  SpO2 93%  Post vital signs: Reviewed  Level of consciousness: sedated  PACU course: Prolonged pacu course due to PON and hypoventilation.  Complications: No apparent anesthesia complications

## 2014-12-30 NOTE — Anesthesia Procedure Notes (Signed)
Procedure Name: Intubation Date/Time: 12/30/2014 7:36 AM Performed by: Epimenio SarinJARVELA, Loranda Mastel R Pre-anesthesia Checklist: Patient identified, Emergency Drugs available, Suction available, Patient being monitored and Timeout performed Patient Re-evaluated:Patient Re-evaluated prior to inductionOxygen Delivery Method: Circle system utilized Preoxygenation: Pre-oxygenation with 100% oxygen Intubation Type: IV induction Ventilation: Mask ventilation without difficulty and Oral airway inserted - appropriate to patient size Laryngoscope Size: Glidescope and 4 Grade View: Grade I Tube type: Oral Tube size: 7.5 mm Number of attempts: 1 Airway Equipment and Method: Stylet Placement Confirmation: ETT inserted through vocal cords under direct vision,  positive ETCO2 and breath sounds checked- equal and bilateral Secured at: 22 cm Tube secured with: Tape Dental Injury: Teeth and Oropharynx as per pre-operative assessment  Future Recommendations: Recommend- induction with short-acting agent, and alternative techniques readily available Comments: Attempt x 3 with Glidescope. Adequate view with difficulty passing ETT. Third attempt by anesthesiologist. Recommend 7.0, possibly narrow glottis. Ventilation with OA and one-handed possible, 2-handed utilized for better ventilation.

## 2014-12-31 ENCOUNTER — Encounter (HOSPITAL_COMMUNITY): Payer: Self-pay | Admitting: General Surgery

## 2014-12-31 ENCOUNTER — Inpatient Hospital Stay (HOSPITAL_COMMUNITY): Payer: Federal, State, Local not specified - PPO

## 2014-12-31 DIAGNOSIS — R0989 Other specified symptoms and signs involving the circulatory and respiratory systems: Secondary | ICD-10-CM

## 2014-12-31 LAB — CBC WITH DIFFERENTIAL/PLATELET
BASOS ABS: 0 10*3/uL (ref 0.0–0.1)
Basophils Relative: 0 % (ref 0–1)
Eosinophils Absolute: 0 10*3/uL (ref 0.0–0.7)
Eosinophils Relative: 0 % (ref 0–5)
HEMATOCRIT: 39.6 % (ref 36.0–46.0)
Hemoglobin: 12.4 g/dL (ref 12.0–15.0)
Lymphocytes Relative: 8 % — ABNORMAL LOW (ref 12–46)
Lymphs Abs: 1.4 10*3/uL (ref 0.7–4.0)
MCH: 28.4 pg (ref 26.0–34.0)
MCHC: 31.3 g/dL (ref 30.0–36.0)
MCV: 90.6 fL (ref 78.0–100.0)
MONO ABS: 1.3 10*3/uL — AB (ref 0.1–1.0)
Monocytes Relative: 7 % (ref 3–12)
NEUTROS ABS: 15.4 10*3/uL — AB (ref 1.7–7.7)
Neutrophils Relative %: 85 % — ABNORMAL HIGH (ref 43–77)
Platelets: 270 10*3/uL (ref 150–400)
RBC: 4.37 MIL/uL (ref 3.87–5.11)
RDW: 14.4 % (ref 11.5–15.5)
WBC: 18 10*3/uL — ABNORMAL HIGH (ref 4.0–10.5)

## 2014-12-31 LAB — COMPREHENSIVE METABOLIC PANEL
ALBUMIN: 3.3 g/dL — AB (ref 3.5–5.2)
ALT: 48 U/L — ABNORMAL HIGH (ref 0–35)
AST: 48 U/L — AB (ref 0–37)
Alkaline Phosphatase: 62 U/L (ref 39–117)
Anion gap: 9 (ref 5–15)
BILIRUBIN TOTAL: 0.6 mg/dL (ref 0.3–1.2)
BUN: 5 mg/dL — ABNORMAL LOW (ref 6–23)
CO2: 25 mmol/L (ref 19–32)
Calcium: 8.6 mg/dL (ref 8.4–10.5)
Chloride: 103 mmol/L (ref 96–112)
Creatinine, Ser: 0.49 mg/dL — ABNORMAL LOW (ref 0.50–1.10)
GFR calc Af Amer: >90 mL/min (ref >90)
Glucose, Bld: 129 mg/dL — ABNORMAL HIGH (ref 70–99)
POTASSIUM: 3.8 mmol/L (ref 3.5–5.1)
Sodium: 137 mmol/L (ref 135–145)
Total Protein: 6 g/dL (ref 6.0–8.3)

## 2014-12-31 LAB — HEMOGLOBIN AND HEMATOCRIT, BLOOD
HEMATOCRIT: 39.1 % (ref 36.0–46.0)
Hemoglobin: 12.4 g/dL (ref 12.0–15.0)

## 2014-12-31 MED ORDER — METOPROLOL TARTRATE 50 MG PO TABS
100.0000 mg | ORAL_TABLET | Freq: Two times a day (BID) | ORAL | Status: DC
  Administered 2014-12-31 – 2015-01-04 (×8): 100 mg via ORAL
  Filled 2014-12-31 (×10): qty 2

## 2014-12-31 MED ORDER — IOHEXOL 300 MG/ML  SOLN
50.0000 mL | Freq: Once | INTRAMUSCULAR | Status: AC | PRN
  Administered 2014-12-31: 50 mL via ORAL

## 2014-12-31 MED ORDER — METOPROLOL TARTRATE 25 MG PO TABS
100.0000 mg | ORAL_TABLET | Freq: Two times a day (BID) | ORAL | Status: DC
  Filled 2014-12-31: qty 4

## 2014-12-31 MED ORDER — ALUM & MAG HYDROXIDE-SIMETH 200-200-20 MG/5ML PO SUSP: 15.0000 mL | Freq: Four times a day (QID) | ORAL | Status: DC | PRN

## 2014-12-31 NOTE — Progress Notes (Signed)
CARE MANAGEMENT NOTE 12/31/2014  Patient:  [name],[name]   Account Number:  [id number]  Date Initiated:  12/31/2014  Documentation initiated by:  Monique Hefty  Subjective/Objective Assessment:   Laparoscopic Roux-en-Y gastric bypass (ante-colic, ante-gastric); Hiatal hernia repair upper endoscopy     Action/Plan:   home when stable   Anticipated DC Date:  01/03/2015   Anticipated DC Plan:  HOME/SELF CARE  In-house referral  NA      DC Planning Services  CM consult      PAC Choice  NA   Choice offered to / List presented to:  NA   DME arranged  NA      DME agency  NA     HH arranged  NA      HH agency  NA   Status of service:  In process, will continue to follow Medicare Important Message given?   (If response is "NO", the following Medicare IM given date fields will be blank) Date Medicare IM given:   Medicare IM given by:   Date Additional Medicare IM given:   Additional Medicare IM given by:    Discharge Disposition:    Per UR Regulation:  Reviewed for med. necessity/level of care/duration of stay  If discussed at Long Length of Stay Meetings, dates discussed:    Comments:  [id number]/Rilan Eiland Stark JockDavis, RN, BSN, CCN: [phone]/ Case management. Chart reviewed for discharge planning and present needs. Discharge needs: none present at time of review. Next chart review due:  [id number]

## 2014-12-31 NOTE — Progress Notes (Signed)
1 Day Post-Op  Subjective: No nausea. Pain much better. desat to 88-89 while walking, walked twice into hallway.   Objective: Vital signs in last 24 hours: Temp:  [97.5 F (36.4 C)-98.7 F (37.1 C)] 97.8 F (36.6 C) (02/03 1200) Pulse Rate:  [65-87] 78 (02/03 1400) Resp:  [12-27] 18 (02/03 1400) BP: (163-213)/(57-160) 163/57 mmHg (02/03 1400) SpO2:  [88 %-95 %] 91 % (02/03 1400) Weight:  [463 lb (210.015 kg)] 463 lb (210.015 kg) (02/03 0600)    Intake/Output from previous day: 02/02 0701 - 02/03 0700 In: 6275 [I.V.:5975; IV Piggyback:300] Out: 3100 [Urine:3000; Blood:100] Intake/Output this shift: Total I/O In: 905 [P.O.:30; I.V.:875] Out: 1050 [Urine:1050]  Alert, nontoxic, looks great!, smiling cta b/l Reg Soft, obese, incisions c/d/i, mild expected TTP  Lab Results:   Recent Labs  12/30/14 1339 12/31/14 0429  WBC  --  18.0*  HGB 13.3 12.4  HCT 42.1 39.6  PLT  --  270   BMET  Recent Labs  12/31/14 0429  NA 137  K 3.8  CL 103  CO2 25  GLUCOSE 129*  BUN 5*  CREATININE 0.49*  CALCIUM 8.6   PT/INR No results for input(s): LABPROT, INR in the last 72 hours. ABG No results for input(s): PHART, HCO3 in the last 72 hours.  Invalid input(s): PCO2, PO2  Studies/Results: Dg Ugi W/water Sol Cm  12/31/2014   CLINICAL DATA:  Postop day 1 gastric bypass and hernia repair.  EXAM: WATER SOLUBLE UPPER GI SERIES  TECHNIQUE: Single-column upper GI series was performed using water soluble contrast.  CONTRAST:  50mL OMNIPAQUE IOHEXOL 300 MG/ML  SOLN  COMPARISON:  None.  FLUOROSCOPY TIME:  Radiation Exposure Index (as provided by the fluoroscopic device):  If the device does not provide the exposure index:  Fluoroscopy Time (in minutes and seconds):  1 min 44 seconds  Number of Acquired Images:  7  FINDINGS: Scout view of the abdomen shows mildly prominent gas-filled loops of bowel. Surgical clips in the right upper quadrant.  Patient drank 50 cc of water-soluble contrast.  There are postoperative changes of gastric bypass. Contrast flows readily into the proximal small bowel. No leak.  IMPRESSION: Postoperative changes of gastric bypass without complicating feature.   Electronically Signed   By: Leanna BattlesMelinda  Blietz M.D.   On: 12/31/2014 10:38    Anti-infectives: Anti-infectives    Start     Dose/Rate Route Frequency Ordered Stop   12/30/14 0531  levofloxacin (LEVAQUIN) IVPB 750 mg     750 mg100 mL/hr over 90 Minutes Intravenous On call to O.R. 12/30/14 0531 12/30/14 0908      Assessment/Plan: s/p Procedure(s): LAPAROSCOPIC ROUX-EN-Y GASTRIC BYPASS WITH HIATAL HERNIA REPAIR AND UPPER ENDOSCOPY (N/A)  pulm - cont IS, out of bed, suspicion for PE is low. But will check LE duplex. Cont to wean Oxy as tolerated CV- BP better, believe readings last night were not accurate. Changed to larger cuff today and BP reading better. Will switch back to home oral B blocker. Cont prn vasotec GI - UGI looks good. Tolerated first water  VTE prophylaxis - cont ambulating, cont SCDs, cont bid lovenox, will make plans for pt to go home on lovenox  Amery Minasyan M. [name], MD, FACS General, Bariatric, & Minimally Invasive Surgery [facility], [place]   LOS: 1 day    Atilano InaWILSON,Tadao Emig M 12/31/2014

## 2014-12-31 NOTE — Progress Notes (Signed)
*  Preliminary Results* Bilateral lower extremity venous duplex completed. Study was technically difficult due to patient body habitus and depth of vessels. Visualized veins of bilateral lower extremities are negative for deep vein thrombosis. There is no evidence of Baker's cyst bilaterally.  12/31/2014  Gertie FeyMichelle Lethia Donlon, RVT, RDCS, RDMS

## 2014-12-31 NOTE — Progress Notes (Signed)
Patient alert and oriented, Post op day 1.  Provided support and encouragement.  Encouraged pulmonary toilet, ambulation and small sips of liquids when swallow study completed satisfactorily.  All questions answered.  Will continue to monitor. 

## 2014-12-31 NOTE — Plan of Care (Signed)
Problem: Food- and Nutrition-Related Knowledge Deficit (NB-1.1) Goal: Nutrition education Formal process to instruct or train a patient/client in a skill or to impart knowledge to help patients/clients voluntarily manage or modify food choices and eating behavior to maintain or improve health. Outcome: Completed/Met Date Met:  12/31/14 Nutrition Education Note  Received consult for diet education per DROP protocol.   Discussed 2 week post op diet with pt. Emphasized that liquids must be non carbonated, non caffeinated, and sugar free. Fluid goals discussed. Pt to follow up with outpatient bariatric RD for further diet progression after 2 weeks. Multivitamins and minerals also reviewed. Teach back method used, pt expressed understanding, expect good compliance.   Diet: First 2 Weeks  You will see the nutritionist about two (2) weeks after your surgery. The nutritionist will increase the types of foods you can eat if you are handling liquids well:  If you have severe vomiting or nausea and cannot handle clear liquids lasting longer than 1 day, call your surgeon  Protein Shake  Drink at least 2 ounces of shake 5-6 times per day  Each serving of protein shakes (usually 8 - 12 ounces) should have a minimum of:  15 grams of protein  And no more than 5 grams of carbohydrate  Goal for protein each day:  Men = 80 grams per day  Women = 60 grams per day  Protein powder may be added to fluids such as non-fat milk or Lactaid milk or Soy milk (limit to 35 grams added protein powder per serving)   Hydration  Slowly increase the amount of water and other clear liquids as tolerated (See Acceptable Fluids)  Slowly increase the amount of protein shake as tolerated  Sip fluids slowly and throughout the day  May use sugar substitutes in small amounts (no more than 6 - 8 packets per day; i.e. Splenda)   Fluid Goal  The first goal is to drink at least 8 ounces of protein shake/drink per day (or as directed  by the nutritionist); some examples of protein shakes are [employer], [employer], EAS Edge HP, and Unjury. See handout from pre-op Bariatric Education Class:  Slowly increase the amount of protein shake you drink as tolerated  You may find it easier to slowly sip shakes throughout the day  It is important to get your proteins in first  Your fluid goal is to drink 64 - 100 ounces of fluid daily  It may take a few weeks to build up to this  32 oz (or more) should be clear liquids  And  32 oz (or more) should be full liquids (see below for examples)  Liquids should not contain sugar, caffeine, or carbonation   Clear Liquids:  Water or Sugar-free flavored water (i.e. Fruit H2O, Propel)  Decaffeinated coffee or tea (sugar-free)  Crystal Lite, Wyler's Lite, Minute Maid Lite  Sugar-free Jell-O  Bouillon or broth  Sugar-free Popsicle: *Less than 20 calories each; Limit 1 per day   Full Liquids:  Protein Shakes/Drinks + 2 choices per day of other full liquids  Full liquids must be:  No More Than 12 grams of Carbs per serving  No More Than 3 grams of Fat per serving  Strained low-fat cream soup  Non-Fat milk  Fat-free Lactaid Milk  Sugar-free yogurt (Dannon Lite & Fit, Greek yogurt)     Laurette Schimke MS, RD, LDN

## 2015-01-01 LAB — CBC WITH DIFFERENTIAL/PLATELET
BASOS PCT: 0 % (ref 0–1)
Basophils Absolute: 0 10*3/uL (ref 0.0–0.1)
Eosinophils Absolute: 0.1 10*3/uL (ref 0.0–0.7)
Eosinophils Relative: 1 % (ref 0–5)
HCT: 36.8 % (ref 36.0–46.0)
Hemoglobin: 11.5 g/dL — ABNORMAL LOW (ref 12.0–15.0)
LYMPHS PCT: 8 % — AB (ref 12–46)
Lymphs Abs: 1.4 10*3/uL (ref 0.7–4.0)
MCH: 28.6 pg (ref 26.0–34.0)
MCHC: 31.3 g/dL (ref 30.0–36.0)
MCV: 91.5 fL (ref 78.0–100.0)
MONO ABS: 1.6 10*3/uL — AB (ref 0.1–1.0)
MONOS PCT: 9 % (ref 3–12)
NEUTROS ABS: 14.4 10*3/uL — AB (ref 1.7–7.7)
NEUTROS PCT: 82 % — AB (ref 43–77)
PLATELETS: 236 10*3/uL (ref 150–400)
RBC: 4.02 MIL/uL (ref 3.87–5.11)
RDW: 14.8 % (ref 11.5–15.5)
WBC: 17.5 10*3/uL — ABNORMAL HIGH (ref 4.0–10.5)

## 2015-01-01 LAB — MRSA PCR SCREENING: MRSA BY PCR: NEGATIVE

## 2015-01-01 MED ORDER — MORPHINE SULFATE 2 MG/ML IJ SOLN: 2.0000 mg | INTRAMUSCULAR | Status: DC | PRN

## 2015-01-01 MED ORDER — ENOXAPARIN SODIUM 40 MG/0.4ML ~~LOC~~ SOLN
40.0000 mg | Freq: Two times a day (BID) | SUBCUTANEOUS | Status: DC
  Administered 2015-01-01 – 2015-01-02 (×2): 40 mg via SUBCUTANEOUS
  Filled 2015-01-01 (×4): qty 0.4

## 2015-01-01 MED ORDER — PROMETHAZINE HCL 25 MG/ML IJ SOLN: 12.5000 mg | Freq: Four times a day (QID) | INTRAMUSCULAR | Status: DC | PRN

## 2015-01-01 MED ORDER — METOCLOPRAMIDE HCL 5 MG/ML IJ SOLN: 5.0000 mg | Freq: Three times a day (TID) | INTRAMUSCULAR | Status: AC | PRN

## 2015-01-01 NOTE — Evaluation (Addendum)
Physical Therapy Evaluation Patient Details Name: [name] MRN: [id number] DOB: 1966-08-22 Today's Date: 01/01/2015   History of Present Illness  LAPAROSCOPIC ROUX-EN-Y GASTRIC BYPASS WITH HIATAL HERNIA REPAIR AND UPPER ENDOSCOPY on 12/30/14  Clinical Impression  Patient very motivated to ambulate. Pt will benefit from PT to address problems listed below in note. sats  Dropped to 82% with ambulation on  4 l.  Back to 92  Within 1 minute. HR in low 90's    Follow Up Recommendations No PT follow up;Home health PT;Supervision/Assistance - 24 hour (will need to se how pt progresses)    Equipment Recommendations  None recommended by PT (may need RW)    Recommendations for Other Services   OT    Precautions / Restrictions Precautions Precautions: Fall Precaution Comments: needs O2, monitor sats Restrictions Weight Bearing Restrictions: No      Mobility  Bed Mobility               General bed mobility comments: sitting on EOB  Transfers Overall transfer level: Needs assistance Equipment used: Rolling walker (2 wheeled);1 person hand held assist Transfers: Sit to/from Stand Sit to Stand: Min guard         General transfer comment: holding onto  bed to rise, quickly sat down due to dyspnea and fatigue  Ambulation/Gait Ambulation/Gait assistance: Min assist Ambulation Distance (Feet): 10 Feet Assistive device: 1 person hand held assist (and holding to  bed, wakll, counter, )   Gait velocity: slow   General Gait Details: encouraged use of RW and pt used for 26 ' with RW, Dyspnea 3/4, on 4 l. [place]  Stairs            Wheelchair Mobility    Modified Rankin (Stroke Patients Only)       Balance Overall balance assessment: Needs assistance         Standing balance support: During functional activity;Single extremity supported Standing balance-Leahy Scale: Poor Standing balance comment: side base                             Pertinent  Vitals/Pain Pain Assessment: 0-10 Pain Score: 4  Pain Location: abdomen Pain Descriptors / Indicators: Aching;Discomfort Pain Intervention(s): Monitored during session;Premedicated before session    Home Living Family/patient expects to be discharged to:: Private residence Living Arrangements: Spouse/significant other Available Help at Discharge: Family Type of Home: House Home Access: Stairs to enter   Secretary/administrator of Steps: 4 Home Layout: One level Home Equipment: None      Prior Function Level of Independence: Independent               Hand Dominance        Extremity/Trunk Assessment   Upper Extremity Assessment: Generalized weakness           Lower Extremity Assessment: Generalized weakness         Communication   Communication: No difficulties  Cognition Arousal/Alertness: Awake/alert Behavior During Therapy: WFL for tasks assessed/performed Overall Cognitive Status: Within Functional Limits for tasks assessed                      General Comments      Exercises        Assessment/Plan    PT Assessment Patient needs continued PT services  PT Diagnosis Difficulty walking;Generalized weakness;Acute pain   PT Problem List Decreased strength;Decreased activity tolerance;Decreased balance;Decreased mobility;Decreased knowledge of precautions;Decreased safety awareness;Pain;Cardiopulmonary  status limiting activity  PT Treatment Interventions DME instruction;Gait training;Stair training;Therapeutic activities;Therapeutic exercise;Patient/family education   PT Goals (Current goals can be found in the Care Plan section) Acute Rehab PT Goals Patient Stated Goal: to walk more PT Goal Formulation: With patient/family Time For Goal Achievement: 01/15/15 Potential to Achieve Goals: Good    Frequency Min 3X/week   Barriers to discharge        Co-evaluation               End of Session Equipment Utilized During Treatment:  Oxygen Activity Tolerance: Patient tolerated treatment well;Patient limited by fatigue Patient left: in bed;with family/visitor present;with call bell/phone within reach Nurse Communication: Mobility status         Time: [id number] PT Time Calculation (min) (ACUTE ONLY): 12 min   Charges:   PT Evaluation $Initial PT Evaluation Tier I: 1 Procedure     PT G CodesRada Hay:        [name] [name] 01/01/2015, 2:57 PM Blanchard KelchKaren Jesse Hirst PT [phone]

## 2015-01-01 NOTE — Progress Notes (Signed)
2 Days Post-Op  Subjective: Tolerated shake. Min nausea. Min pain. No regurgitation.   Objective: Vital signs in last 24 hours: Temp:  [97.7 F (36.5 C)-99.5 F (37.5 C)] 98.5 F (36.9 C) (02/04 0757) Pulse Rate:  [73-89] 78 (02/04 0400) Resp:  [18-29] 18 (02/04 0400) BP: (150-173)/(45-71) 150/66 mmHg (02/04 0557) SpO2:  [88 %-97 %] 93 % (02/04 0400) Weight:  [465 lb (210.923 kg)] 465 lb (210.923 kg) (02/04 0600)    Intake/Output from previous day: 02/03 0701 - 02/04 0700 In: 3165 [P.O.:165; I.V.:3000] Out: 2225 [Urine:2225] Intake/Output this shift:    Asleep, easily awakens. Nontoxic, not ill appearing cta b/l Reg Soft, obese, incisions c/d/i; mild upper abd ttp. No guarding/rebound No edema  Lab Results:   Recent Labs  12/31/14 0429 12/31/14 1539 01/01/15 0621  WBC 18.0*  --  17.5*  HGB 12.4 12.4 11.5*  HCT 39.6 39.1 36.8  PLT 270  --  236   BMET  Recent Labs  12/31/14 0429  NA 137  K 3.8  CL 103  CO2 25  GLUCOSE 129*  BUN 5*  CREATININE 0.49*  CALCIUM 8.6   PT/INR No results for input(s): LABPROT, INR in the last 72 hours. ABG No results for input(s): PHART, HCO3 in the last 72 hours.  Invalid input(s): PCO2, PO2  Studies/Results: Dg Ugi W/water Sol Cm  12/31/2014   CLINICAL DATA:  Postop day 1 gastric bypass and hernia repair.  EXAM: WATER SOLUBLE UPPER GI SERIES  TECHNIQUE: Single-column upper GI series was performed using water soluble contrast.  CONTRAST:  50mL OMNIPAQUE IOHEXOL 300 MG/ML  SOLN  COMPARISON:  None.  FLUOROSCOPY TIME:  Radiation Exposure Index (as provided by the fluoroscopic device):  If the device does not provide the exposure index:  Fluoroscopy Time (in minutes and seconds):  1 min 44 seconds  Number of Acquired Images:  7  FINDINGS: Scout view of the abdomen shows mildly prominent gas-filled loops of bowel. Surgical clips in the right upper quadrant.  Patient drank 50 cc of water-soluble contrast. There are postoperative  changes of gastric bypass. Contrast flows readily into the proximal small bowel. No leak.  IMPRESSION: Postoperative changes of gastric bypass without complicating feature.   Electronically Signed   By: Leanna BattlesMelinda  Blietz M.D.   On: 12/31/2014 10:38    Anti-infectives: Anti-infectives    Start     Dose/Rate Route Frequency Ordered Stop   12/30/14 0531  levofloxacin (LEVAQUIN) IVPB 750 mg     750 mg100 mL/hr over 90 Minutes Intravenous On call to O.R. 12/30/14 0531 12/30/14 0908      Assessment/Plan: s/p Procedure(s): LAPAROSCOPIC ROUX-EN-Y GASTRIC BYPASS WITH HIATAL HERNIA REPAIR AND UPPER ENDOSCOPY (N/A)  pulm - cont pulm toilet, IS, oob, resting O2 sat good but still on some o2. Wean O2 to RA.  CV - BP better, cont home bp med Renal - uop ok GI- cont POD 2 diet VTE prophylaxis - cont bid lovenox, duplex negative for DVT  dispo - prob dc on Friday. Need to work on O2 today. May need home O2 temporarily; tx to floor  Mary SellaEric M. [name], MD, FACS General, Bariatric, & Minimally Invasive Surgery [facility] [place]   LOS: 2 days    Atilano InaWILSON,Hrishikesh Hoeg M 01/01/2015

## 2015-01-01 NOTE — Progress Notes (Signed)
Patient alert and oriented, Post op day 2.  Provided support and encouragement.  Encouraged pulmonary toilet, ambulation and small sips of liquids.  All questions answered.  Will continue to monitor. 

## 2015-01-01 NOTE — Progress Notes (Signed)
ANTICOAGULATION CONSULT NOTE - Initial Consult  Pharmacy Consult for enoxaparin Indication: VTE prophylaxis  Allergies  Allergen Reactions  . Penicillins Swelling  . Darvocet [Propoxyphene N-Acetaminophen] Rash    Patient Measurements: Height: 5\' 9"  (175.3 cm) Weight: (!) 465 lb (210.923 kg) IBW/kg (Calculated) : 66.2 Heparin Dosing Weight:   Vital Signs: Temp: 98.5 F (36.9 C) (02/04 0757) Temp Source: Oral (02/04 0757) BP: 170/67 mmHg (02/04 0829) Pulse Rate: 87 (02/04 0829)  Labs:  Recent Labs  12/31/14 0429 12/31/14 1539 01/01/15 0621  HGB 12.4 12.4 11.5*  HCT 39.6 39.1 36.8  PLT 270  --  236  CREATININE 0.49*  --   --     Estimated Creatinine Clearance: 166.7 mL/min (by C-G formula based on Cr of 0.49).   Medical History: Past Medical History  Diagnosis Date  . Hypertension   . Pneumonia 07/18/2011  . Morbid obesity with BMI of 60.0-69.9, adult     BMI 62  . Anxiety   . Depression   . History of hiatal hernia   . Headache     hx of migraines   . Arthritis     knees   . Anemia     Assessment: [age] YOF s/p  Roux-en-Y gastric bypass 2/2.  Her enoxaparin dose was increased from 30mg  SQ q12h to 40mg  q12h by surgery 2/4am.  Pharmacy consulted regarding home dosing of enoxaparin   BMI = 66 (wt = 210.9kg)  CBC: Hgb = 11.5, pltc WNL  Renal: Scr on low-end, CrCl >12900ml/min  Goal of Therapy:  Anti-Xa level 0.3-0.6 units/ml 4hrs after LMWH dose given   Plan:   At discharge I would recommend enoxaparin 40mg  SQ q24h for VTE prophylaxis  Although higher doses have been used (ex 40mg  SQ q12h), no data proves this is more effective and may increase risk of bleeding  Juliette Alcideustin Rut Betterton, PharmD, BCPS.   Pager: [phone]  01/01/2015,10:03 AM

## 2015-01-02 ENCOUNTER — Inpatient Hospital Stay (HOSPITAL_COMMUNITY): Payer: Federal, State, Local not specified - PPO

## 2015-01-02 ENCOUNTER — Encounter (HOSPITAL_COMMUNITY): Payer: Self-pay

## 2015-01-02 DIAGNOSIS — J9601 Acute respiratory failure with hypoxia: Secondary | ICD-10-CM | POA: Insufficient documentation

## 2015-01-02 DIAGNOSIS — J95821 Acute postprocedural respiratory failure: Secondary | ICD-10-CM

## 2015-01-02 LAB — CBC
HCT: 34.4 % — ABNORMAL LOW (ref 36.0–46.0)
HCT: 36 % (ref 36.0–46.0)
Hemoglobin: 10.5 g/dL — ABNORMAL LOW (ref 12.0–15.0)
Hemoglobin: 11.3 g/dL — ABNORMAL LOW (ref 12.0–15.0)
MCH: 28.2 pg (ref 26.0–34.0)
MCH: 28.6 pg (ref 26.0–34.0)
MCHC: 30.5 g/dL (ref 30.0–36.0)
MCHC: 31.4 g/dL (ref 30.0–36.0)
MCV: 92.5 fL (ref 78.0–100.0)
MCV: 91.1 fL (ref 78.0–100.0)
PLATELETS: 222 10*3/uL (ref 150–400)
PLATELETS: 243 10*3/uL (ref 150–400)
RBC: 3.72 MIL/uL — ABNORMAL LOW (ref 3.87–5.11)
RBC: 3.95 MIL/uL (ref 3.87–5.11)
RDW: 14.9 % (ref 11.5–15.5)
RDW: 14.5 % (ref 11.5–15.5)
WBC: 16.9 10*3/uL — ABNORMAL HIGH (ref 4.0–10.5)
WBC: 17 10*3/uL — ABNORMAL HIGH (ref 4.0–10.5)

## 2015-01-02 LAB — BASIC METABOLIC PANEL
Anion gap: 7 (ref 5–15)
BUN: 8 mg/dL (ref 6–23)
CALCIUM: 8.3 mg/dL — AB (ref 8.4–10.5)
CO2: 30 mmol/L (ref 19–32)
Chloride: 101 mmol/L (ref 96–112)
Creatinine, Ser: 0.49 mg/dL — ABNORMAL LOW (ref 0.50–1.10)
Glucose, Bld: 121 mg/dL — ABNORMAL HIGH (ref 70–99)
POTASSIUM: 3.9 mmol/L (ref 3.5–5.1)
Sodium: 138 mmol/L (ref 135–145)

## 2015-01-02 LAB — PROCALCITONIN: Procalcitonin: <0.1 ng/mL

## 2015-01-02 LAB — TROPONIN I: Troponin I: <0.03 ng/mL (ref <0.031)

## 2015-01-02 LAB — BRAIN NATRIURETIC PEPTIDE: B Natriuretic Peptide: 194 pg/mL — ABNORMAL HIGH (ref 0.0–100.0)

## 2015-01-02 LAB — LACTIC ACID, PLASMA: Lactic Acid, Venous: 1.1 mmol/L (ref 0.5–2.0)

## 2015-01-02 MED ORDER — IOHEXOL 350 MG/ML SOLN
100.0000 mL | Freq: Once | INTRAVENOUS | Status: AC | PRN
  Administered 2015-01-02: 100 mL via INTRAVENOUS

## 2015-01-02 MED ORDER — LEVOFLOXACIN IN D5W 750 MG/150ML IV SOLN
750.0000 mg | INTRAVENOUS | Status: DC
  Administered 2015-01-02 – 2015-01-03 (×2): 750 mg via INTRAVENOUS
  Filled 2015-01-02 (×3): qty 150

## 2015-01-02 MED ORDER — HEPARIN (PORCINE) IN NACL 100-0.45 UNIT/ML-% IJ SOLN
2150.0000 [IU]/h | INTRAMUSCULAR | Status: DC
  Administered 2015-01-02: 2150 [IU]/h via INTRAVENOUS
  Filled 2015-01-02: qty 250

## 2015-01-02 MED ORDER — HEPARIN BOLUS VIA INFUSION
4000.0000 [IU] | Freq: Once | INTRAVENOUS | Status: AC
  Administered 2015-01-02: 4000 [IU] via INTRAVENOUS
  Filled 2015-01-02: qty 4000

## 2015-01-02 MED ORDER — FUROSEMIDE 10 MG/ML IJ SOLN
40.0000 mg | Freq: Two times a day (BID) | INTRAMUSCULAR | Status: AC
  Administered 2015-01-02 – 2015-01-04 (×4): 40 mg via INTRAVENOUS
  Filled 2015-01-02 (×4): qty 4

## 2015-01-02 MED ORDER — ENOXAPARIN SODIUM 30 MG/0.3ML ~~LOC~~ SOLN
30.0000 mg | Freq: Two times a day (BID) | SUBCUTANEOUS | Status: DC
  Administered 2015-01-02 – 2015-01-04 (×4): 30 mg via SUBCUTANEOUS
  Filled 2015-01-02 (×7): qty 0.3

## 2015-01-02 NOTE — Consult Note (Signed)
PULMONARY / CRITICAL CARE MEDICINE   Name: Tamara RiggsDenise B Lebow MRN: [id number] DOB: 11/09/1966    ADMISSION DATE:  12/30/2014 CONSULTATION DATE:  12/30/2014   REFERRING MD :  Dr Gaynelle AduEric Wilson of [facility]  CHIEF COMPLAINT:  Post op hypoxemic acute resp failure  SIGNIFICANT EVENTS: 12/30/2014 - LAPAROSCOPIC ROUX-EN-Y GASTRIC BYPASS WITH HIATAL HERNIA REPAIR (N/A) INSERTION OF MESH (N/A) as a surgical intervention  01/11/15 - PCCM consulted for ongoing hypoxemia  HISTORY OF PRESENT ILLNESS:   49 year old morbidly obese female  udnerwent LAPAROSCOPIC ROUX-EN-Y GASTRIC BYPASS WITH HIATAL HERNIA REPAIR (N/A) INSERTION OF MESH (N/A) as a surgical intervention and per her and RN reprot even in the immediate post op period had acute post op hypoxemia that only some improved despite ICU care. She is now in medical floor needing 4L O2 but feeling ok at rest. With exertion on RA she progressively desaturates to 69% on RA and only worsens with continued walking. Denies cough, fever, hemoptysis, chest pain, sputum, pedal edema. She says she has been on lovenox DVT proph since pod#1 atleast. No prior PE. Duplex LE poor study due to body habitus is negative for DVT. Otherwise well and aiming for discharge soon to home    PAST MEDICAL HISTORY :   has a past medical history of Hypertension; Pneumonia (07/18/2011); Morbid obesity with BMI of 60.0-69.9, adult; Anxiety; Depression; History of hiatal hernia; Headache; Arthritis; and Anemia.  has past surgical history that includes Tonsillectomy; Cholecystectomy; Appendectomy; Cesarean section; and Laparoscopic roux-en-y gastric bypass with hiatal hernia repair (N/A, 12/30/2014). Prior to Admission medications   Medication Sig Start Date End Date Taking? Authorizing Provider  Cholecalciferol (VITAMIN D3) 5000 UNITS TABS Take 1 tablet by mouth daily.   Yes Historical Provider, MD  diphenhydramine-acetaminophen (TYLENOL PM) 25-500 MG TABS Take 1 tablet by mouth at bedtime as needed.    Yes Historical Provider, MD  FLUoxetine (PROZAC) 20 MG capsule Take 60 mg by mouth every morning.    Yes Historical Provider, MD  Iron Polysacch Cmplx-B12-FA (POLY-IRON 150 FORTE PO) Take 1 capsule by mouth daily.   Yes Historical Provider, MD  metoprolol succinate (TOPROL-XL) 100 MG 24 hr tablet Take 200 mg by mouth every morning. Take with or immediately following a meal.   Yes Historical Provider, MD  Multiple Vitamin (MULTIVITAMIN WITH MINERALS) TABS tablet Take 1 tablet by mouth daily.   Yes Historical Provider, MD  VESTURA 3-0.02 MG tablet Take 1 tablet by mouth daily. 05/24/14  Yes Historical Provider, MD  desogestrel-ethinyl estradiol (AZURETTE) 0.15-0.02/0.01 MG (21/5) tablet Take 1 tablet by mouth daily. Patient not taking: Reported on 12/24/2014 07/12/12 12/12/13  Silverio LaySandra Rivard, MD  Iron Polysacch Cmplx-B12-FA (POLY-IRON 150 FORTE) 150-0.025-1 MG CAPS TAKE ONE CAPSULE BY MOUTH EVERY DAY Patient not taking: Reported on 12/23/2014 07/17/14   Si GaulMohamed Mohamed, MD   Allergies  Allergen Reactions  . Penicillins Swelling  . Darvocet [Propoxyphene N-Acetaminophen] Rash    FAMILY HISTORY:  has no family status information on file.  SOCIAL HISTORY:  reports that she has never smoked. She has never used smokeless tobacco. She reports that she does not drink alcohol or use illicit drugs.  REVIEW OF SYSTEMS:  Dyspnea +. Otherwsie 11 point ROS negative  SUBJECTIVE:   VITAL SIGNS: Temp:  [97.8 F (36.6 C)-99.2 F (37.3 C)] 98.2 F (36.8 C) (02/05 1000) Pulse Rate:  [71-87] 73 (02/05 1000) Resp:  [18-20] 18 (02/05 1000) BP: (111-142)/(46-60) 127/60 mmHg (02/05 1000) SpO2:  [90 %-97 %]  90 % (02/05 1000) Weight:  [210.922 kg (465 lb)] 210.922 kg (465 lb) (02/05 0547) HEMODYNAMICS:   VENTILATOR SETTINGS:   INTAKE / OUTPUT:  Intake/Output Summary (Last 24 hours) at 01/02/15 1206 Last data filed at 01/02/15 1000  Gross per 24 hour  Intake   3300 ml  Output   1100 ml  Net   2200 ml     PHYSICAL EXAMINATION: General:  Morbidly obese. Looks well Neuro:  AXOX3. Speech normal HEENT:  Mallampatti class 3 Cardiovascular:  Normal heart sounds. No murmurs Lungs:  CTA bilaterally Abdomen:  Obese, surgicl scar + Musculoskeletal:  No cyanosis. No clubbing Minimal chronic edema only. Homans negative Skin:  Intact anteriorly  LABS:  CBC  Recent Labs Lab 12/31/14 0429 12/31/14 1539 01/01/15 0621 01/02/15 0500  WBC 18.0*  --  17.5* 16.9*  HGB 12.4 12.4 11.5* 10.5*  HCT 39.6 39.1 36.8 34.4*  PLT 270  --  236 222   Coag's No results for input(s): APTT, INR in the last 168 hours. BMET  Recent Labs Lab 12/31/14 0429 01/02/15 0500  NA 137 138  K 3.8 3.9  CL 103 101  CO2 25 30  BUN 5* 8  CREATININE 0.49* 0.49*  GLUCOSE 129* 121*   Electrolytes  Recent Labs Lab 12/31/14 0429 01/02/15 0500  CALCIUM 8.6 8.3*   Sepsis Markers No results for input(s): LATICACIDVEN, PROCALCITON, O2SATVEN in the last 168 hours. ABG No results for input(s): PHART, PCO2ART, PO2ART in the last 168 hours. Liver Enzymes  Recent Labs Lab 12/31/14 0429  AST 48*  ALT 48*  ALKPHOS 62  BILITOT 0.6  ALBUMIN 3.3*   Cardiac Enzymes No results for input(s): TROPONINI, PROBNP in the last 168 hours. Glucose No results for input(s): GLUCAP in the last 168 hours.  Imaging No results found.   ASSESSMENT / PLAN:  PULMONARY  A:Acute Post OP Non resolving Hypoxemic Resp Failure - needing 4L o2   - ddx - PE, Atelectasis, Acute diast dysfn . Less likely pneumoia  P:   IV heparin empiric Rx CT angio PE protocol [facility] ER scanner can take 480# patient  - she is 460#) If PE negative, try diuresis and mobilziation PCCM wil follow    Dr. Kalman Shan, M.D., [facility].C.P Pulmonary and Critical Care Medicine Staff Physician [facility]  Pulmonary and Critical Care Pager: [phone], If no answer or between  15:00h - 7:00h: call [phone]  01/02/2015 12:17 PM

## 2015-01-02 NOTE — Discharge Instructions (Signed)

## 2015-01-02 NOTE — Progress Notes (Signed)
PT Cancellation Note  Patient Details Name: Bobbye RiggsDenise B Hicklin MRN: [id number] DOB: 07/22/1966   Cancelled Treatment:     pt amb with RN earlier and currently sitting EOB Indep with spouse on room.    Felecia ShellingLori Ashea Winiarski  PTA [facility]  Acute  Rehab Pager      [phone]

## 2015-01-02 NOTE — Progress Notes (Signed)
ANTICOAGULATION CONSULT NOTE - Initial Consult  Pharmacy Consult for IV Heparin Indication: suspected acute pulmonary embolism  Allergies  Allergen Reactions  . Penicillins Swelling  . Darvocet [Propoxyphene N-Acetaminophen] Rash    Patient Measurements: Height: 5\' 9"  (175.3 cm) Weight: (!) 465 lb (210.922 kg) IBW/kg (Calculated) : 66.2 Heparin Dosing Weight: 121 kg  Vital Signs: Temp: 98.2 F (36.8 C) (02/05 1000) Temp Source: Oral (02/05 1000) BP: 127/60 mmHg (02/05 1000) Pulse Rate: 73 (02/05 1000)  Labs:  Recent Labs  12/31/14 0429 12/31/14 1539 01/01/15 0621 01/02/15 0500  HGB 12.4 12.4 11.5* 10.5*  HCT 39.6 39.1 36.8 34.4*  PLT 270  --  236 222  CREATININE 0.49*  --   --  0.49*    Estimated Creatinine Clearance: 166.7 mL/min (by C-G formula based on Cr of 0.49).   Medical History: Past Medical History  Diagnosis Date  . Hypertension   . Pneumonia 07/18/2011  . Morbid obesity with BMI of 60.0-69.9, adult     BMI 62  . Anxiety   . Depression   . History of hiatal hernia   . Headache     hx of migraines   . Arthritis     knees   . Anemia     Medications:  Scheduled:  . antiseptic oral rinse  7 mL Mouth Rinse BID  . metoprolol  100 mg Oral BID  . pantoprazole (PROTONIX) IV  40 mg Intravenous QHS  . protein supplement  2 oz Oral QID   Or  . protein supplement  2 oz Oral QID   Or  . protein supplement  2 oz Oral QID   Infusions:  . dextrose 5 % and 0.45 % NaCl with KCl 20 mEq/L 125 mL/hr (01/01/15 2200)   PRN: oxyCODONE **AND** acetaminophen, acetaminophen (TYLENOL) oral liquid 160 mg/5 mL, alum & mag hydroxide-simeth, enalaprilat, fentaNYL, metoCLOPramide (REGLAN) injection, morphine injection, ondansetron (ZOFRAN) IV, promethazine  Assessment: 49 y/o F s/p laparoscopic Roux-en-Y gastric bypass 12/30/14 for morbid obesity, now with acute hypoxemic respiratory failure and suspected pulmonary embolism.  Orders received from pulmonary/critical  care medicine to discontinue prophylactic-dose Lovenox and begin full-dose IV heparin with pharmacy dosing assistance. CT angio was ordered.  Administration of Lovenox 40mg  SQ this AM noted.   Goal of Therapy:  Heparin level 0.3-0.7 units/ml Monitor platelets by anticoagulation protocol: Yes   Plan:  1. Heparin 4000 units IV bolus x 1 stat 2. Heparin 2150 units/hr IV infusion 3. Heparin level tonight at 7pm 4. Await results of CT angio. 5  Daily heparin level, CBC. 6. Follow clinical course.  Elie Goodyandy Irma Roulhac, PharmD, BCPS Pager: [phone] 01/02/2015  12:28 PM

## 2015-01-02 NOTE — Progress Notes (Signed)
OT Cancellation Note  Patient Details Name: Tamara RiggsDenise B Townsend MRN: [id number] DOB: 12/19/1965   Cancelled Treatment:    Reason Eval/Treat Not Completed: Other (comment). Pt just got resettled into bed and would like OT to complete assessment tomorrow a.m.  Will return then.  Hodan Wurtz 01/02/2015, 3:43 PM  Marica OtterMaryellen Tramon Crescenzo, OTR/L [phone] 01/02/2015

## 2015-01-02 NOTE — Progress Notes (Signed)
CT A reiverwed  Ct Angio Chest Pe W/cm &/or Wo Cm  01/02/2015   CLINICAL DATA:  Shortness of breath. Three days status post gastric bypass procedure  EXAM: CT ANGIOGRAPHY CHEST WITH CONTRAST  TECHNIQUE: Multidetector CT imaging of the chest was performed using the standard protocol during bolus administration of intravenous contrast. Multiplanar CT image reconstructions and MIPs were obtained to evaluate the vascular anatomy.  CONTRAST:  127m OMNIPAQUE IOHEXOL 350 MG/ML SOLN  COMPARISON:  August 27, 2014  FINDINGS: There is no demonstrable pulmonary embolus. There is no thoracic aortic aneurysm or dissection.  There is diffuse alveolar opacity throughout the lungs bilaterally.  There is no appreciable thoracic adenopathy. The pericardium is not thickened. Heart is upper normal in size.  In the visualized upper abdomen, no lesion is appreciable. There are no blastic or lytic bone lesions. Thyroid appears normal.  Review of the MIP images confirms the above findings.  IMPRESSION: No demonstrable pulmonary embolus.  Widespread alveolar opacity bilaterally. Suspect ARDS. An acute allergic type response is a differential consideration. Both entities may exist concurrently. Widespread infectious pneumonia is a third differential consideration.  These results were called by telephone at the time of interpretation on 01/02/2015 at 2:12 pm to Dr. EGreer Pickerel, who verbally acknowledged these results.   Electronically Signed   By: WLowella GripM.D.   On: 01/02/2015 14:13   No PE CTA - central sparin like eos pneumonia or BOOP   PLAN  DC heparin IV   Anti-infectives    Start     Dose/Rate Route Frequency Ordered Stop   01/02/15 1800  levofloxacin (LEVAQUIN) IVPB 750 mg     750 mg100 mL/hr over 90 Minutes Intravenous Every 24 hours 01/02/15 1622     12/30/14 0531  levofloxacin (LEVAQUIN) IVPB 750 mg     750 mg100 mL/hr over 90 Minutes Intravenous On call to O.R. 12/30/14 0531 12/30/14 0908      Diurese  Check ESR - if no improivement consider steroids  Dr. MBrand Males M.D., [facility].P Pulmonary and Critical Care Medicine Staff Physician CLehighPulmonary and Critical Care Pager: [phone] If no answer or between  15:00h - 7:00h: call [phone]  01/02/2015 6:06 PM

## 2015-01-02 NOTE — Progress Notes (Signed)
3 Days Post-Op  Subjective: States she feels well. No n/v. Tolerating liquids. Pain well controlled. Can get a little more winded with ambulation. Does desat to 82% on o2 with ambulation yesterday. Denies calf pain.   Objective: Vital signs in last 24 hours: Temp:  [97.8 F (36.6 C)-99.2 F (37.3 C)] 98.7 F (37.1 C) (02/05 0547) Pulse Rate:  [71-87] 71 (02/05 0547) Resp:  [20-28] 20 (02/05 0547) BP: (111-170)/(46-67) 131/57 mmHg (02/05 0547) SpO2:  [93 %-97 %] 96 % (02/05 0547) Weight:  [465 lb (210.922 kg)] 465 lb (210.922 kg) (02/05 0547) Last BM Date: 12/31/14  Intake/Output from previous day: 02/04 0701 - 02/05 0700 In: 3245.8 [P.O.:300; I.V.:2945.8] Out: 1300 [Urine:1300] Intake/Output this shift:    Looks good. Nontoxic cta anteriorly Reg Soft, obese, nontender, incisions c/d/i +scds  Lab Results:   Recent Labs  01/01/15 0621 01/02/15 0500  WBC 17.5* 16.9*  HGB 11.5* 10.5*  HCT 36.8 34.4*  PLT 236 222   BMET  Recent Labs  12/31/14 0429 01/02/15 0500  NA 137 138  K 3.8 3.9  CL 103 101  CO2 25 30  GLUCOSE 129* 121*  BUN 5* 8  CREATININE 0.49* 0.49*  CALCIUM 8.6 8.3*   PT/INR No results for input(s): LABPROT, INR in the last 72 hours. ABG No results for input(s): PHART, HCO3 in the last 72 hours.  Invalid input(s): PCO2, PO2  Studies/Results: Dg Ugi W/water Sol Cm  12/31/2014   CLINICAL DATA:  Postop day 1 gastric bypass and hernia repair.  EXAM: WATER SOLUBLE UPPER GI SERIES  TECHNIQUE: Single-column upper GI series was performed using water soluble contrast.  CONTRAST:  50mL OMNIPAQUE IOHEXOL 300 MG/ML  SOLN  COMPARISON:  None.  FLUOROSCOPY TIME:  Radiation Exposure Index (as provided by the fluoroscopic device):  If the device does not provide the exposure index:  Fluoroscopy Time (in minutes and seconds):  1 min 44 seconds  Number of Acquired Images:  7  FINDINGS: Scout view of the abdomen shows mildly prominent gas-filled loops of bowel.  Surgical clips in the right upper quadrant.  Patient drank 50 cc of water-soluble contrast. There are postoperative changes of gastric bypass. Contrast flows readily into the proximal small bowel. No leak.  IMPRESSION: Postoperative changes of gastric bypass without complicating feature.   Electronically Signed   By: Leanna BattlesMelinda  Blietz M.D.   On: 12/31/2014 10:38    Anti-infectives: Anti-infectives    Start     Dose/Rate Route Frequency Ordered Stop   12/30/14 0531  levofloxacin (LEVAQUIN) IVPB 750 mg     750 mg100 mL/hr over 90 Minutes Intravenous On call to O.R. 12/30/14 0531 12/30/14 0908      Assessment/Plan: s/p Procedure(s): LAPAROSCOPIC ROUX-EN-Y GASTRIC BYPASS WITH HIATAL HERNIA REPAIR AND UPPER ENDOSCOPY (N/A)  My suspicion for DVT is low. My thoughts are that this is probably where she lives. However, i will consult pulmonary to weigh in.  Cont current diet Cont bid lovenox for prophylaxis Probable discharge later today. Discussed dc instructions.  OT consult this am  Mary Sellaric M. [name], MD, FACS General, Bariatric, & Minimally Invasive Surgery [facility] [place]   LOS: 3 days    Atilano InaWILSON,Nasir Bright M 01/02/2015

## 2015-01-02 NOTE — Progress Notes (Signed)
Patient alert and oriented, pain is controlled. Patient is tolerating fluids, advanced to protein shake yesterday and patient tolerated well. Reviewed Gastric Bypass discharge instructions with patient and patient is able to articulate understanding. Provided information on BELT program, Support Group and [facility] outpatient pharmacy. All questions answered, will continue to monitor.

## 2015-01-02 NOTE — Progress Notes (Signed)
Events reviewed. CT reviewed. Discussed with Dr Tyson AliasFeinstein.  Pt sitting on BSC. Looks good. Nontoxic, not ill appearing. O2 sat 96% on 2L (was on 4L yesterday) but still desats with ambulation. No SOB at rest. IS about 1350. Tolerating shakes.  Discussed workup so far.   Pt did receive heparin bolus and some hep gtt for presumed PE however CT scan negative for PE,. Heparin stopped. Scheduled to resume bid prophylactic dose this evening. CT showed findings concerning for ARDS. Started on empiric levaquin for presumed PNA. Troponin neg. BNP not that elevated. Was also placed on bid Lasix  Will check CBC now to make sure no bleeding from systemic IV heparin Ok with empiric abx.  Appreciate CCM help She looks better than CT Add flutter valve Monitor volume status given scheduled lasix and limited PO given new gastric bypass anatomy. Monitor kidney function daily If CCM believes she needs steroids - please discuss with me before instituting.   Mary SellaEric M. [name], MD, FACS General, Bariatric, & Minimally Invasive Surgery [facility] [facility] [place]

## 2015-01-02 NOTE — Progress Notes (Signed)
ANTIBIOTIC CONSULT NOTE - INITIAL  Pharmacy Consult for Levaquin Indication: rule out pneumonia  Allergies  Allergen Reactions  . Penicillins Swelling  . Darvocet [Propoxyphene N-Acetaminophen] Rash    Patient Measurements: Height: 5\' 9"  (175.3 cm) Weight: (!) 465 lb (210.922 kg) IBW/kg (Calculated) : 66.2  Vital Signs: Temp: 97.9 F (36.6 C) (02/05 1400) Temp Source: Oral (02/05 1400) BP: 147/48 mmHg (02/05 1400) Pulse Rate: 69 (02/05 1400) Intake/Output from previous day: 02/04 0701 - 02/05 0700 In: 3245.8 [P.O.:300; I.V.:2945.8] Out: 1300 [Urine:1300] Intake/Output from this shift: Total I/O In: 929.2 [I.V.:929.2] Out: 675 [Urine:675]  Labs:  Recent Labs  12/31/14 0429 12/31/14 1539 01/01/15 0621 01/02/15 0500  WBC 18.0*  --  17.5* 16.9*  HGB 12.4 12.4 11.5* 10.5*  PLT 270  --  236 222  CREATININE 0.49*  --   --  0.49*   Estimated Creatinine Clearance: 166.7 mL/min (by C-G formula based on Cr of 0.49). No results for input(s): VANCOTROUGH, VANCOPEAK, VANCORANDOM, GENTTROUGH, GENTPEAK, GENTRANDOM, TOBRATROUGH, TOBRAPEAK, TOBRARND, AMIKACINPEAK, AMIKACINTROU, AMIKACIN in the last 72 hours.   Microbiology: Recent Results (from the past 720 hour(s))  MRSA PCR Screening     Status: None   Collection Time: 01/01/15  4:16 AM  Result Value Ref Range Status   MRSA by PCR NEGATIVE NEGATIVE Final    Comment:        The GeneXpert MRSA Assay (FDA approved for NASAL specimens only), is one component of a comprehensive MRSA colonization surveillance program. It is not intended to diagnose MRSA infection nor to guide or monitor treatment for MRSA infections.     Medical History: Past Medical History  Diagnosis Date  . Hypertension   . Pneumonia 07/18/2011  . Morbid obesity with BMI of 60.0-69.9, adult     BMI 62  . Anxiety   . Depression   . History of hiatal hernia   . Headache     hx of migraines   . Arthritis     knees   . Anemia      Medications:  Scheduled:  . antiseptic oral rinse  7 mL Mouth Rinse BID  . enoxaparin (LOVENOX) injection  30 mg Subcutaneous Q12H  . furosemide  40 mg Intravenous Q12H  . metoprolol  100 mg Oral BID  . pantoprazole (PROTONIX) IV  40 mg Intravenous QHS  . protein supplement  2 oz Oral QID   Or  . protein supplement  2 oz Oral QID   Or  . protein supplement  2 oz Oral QID   Infusions:  . dextrose 5 % and 0.45 % NaCl with KCl 20 mEq/L 125 mL/hr at 01/02/15 1423   PRN: oxyCODONE **AND** acetaminophen, acetaminophen (TYLENOL) oral liquid 160 mg/5 mL, alum & mag hydroxide-simeth, enalaprilat, fentaNYL, morphine injection, ondansetron (ZOFRAN) IV, promethazine  Assessment: 49 yo female s/p Roux-en-Y gastric bypass 2/2 now with acute hypoxemic respiratory failure. Initial concern for PE, but CT negative. Did show possible infectious pneumonia. Pharmacy is now consulted to dose levaquin for possible aspiration. Note penicillin allergy.  2/5 >> Levaquin >>  Tmax: 99.2 WBC: 16.9, slightly improved Renal: SCr 0.49, stable, CrCl > 100   2/4 MRSA PCR: neg 2/5 sputum:  Goal of Therapy:  Eradication of infection Dose per renal function  Plan:   Levaquin 750mg  IV q24h Follow up renal function & cultures, clinical course  Loralee PacasErin Jerrye Seebeck, PharmD, BCPS Pager: [phone] 01/02/2015,4:19 PM

## 2015-01-03 DIAGNOSIS — R918 Other nonspecific abnormal finding of lung field: Secondary | ICD-10-CM

## 2015-01-03 DIAGNOSIS — Z6841 Body Mass Index (BMI) 40.0 and over, adult: Secondary | ICD-10-CM

## 2015-01-03 LAB — PHOSPHORUS: PHOSPHORUS: 3.3 mg/dL (ref 2.3–4.6)

## 2015-01-03 LAB — BASIC METABOLIC PANEL
ANION GAP: 11 (ref 5–15)
BUN: 8 mg/dL (ref 6–23)
CHLORIDE: 99 mmol/L (ref 96–112)
CO2: 29 mmol/L (ref 19–32)
Calcium: 8.5 mg/dL (ref 8.4–10.5)
Creatinine, Ser: 0.42 mg/dL — ABNORMAL LOW (ref 0.50–1.10)
GFR calc Af Amer: >90 mL/min (ref >90)
GFR calc non Af Amer: >90 mL/min (ref >90)
Glucose, Bld: 107 mg/dL — ABNORMAL HIGH (ref 70–99)
Potassium: 3.5 mmol/L (ref 3.5–5.1)
SODIUM: 139 mmol/L (ref 135–145)

## 2015-01-03 LAB — SEDIMENTATION RATE: Sed Rate: 85 mm/hr — ABNORMAL HIGH (ref 0–22)

## 2015-01-03 LAB — CBC
HCT: 35 % — ABNORMAL LOW (ref 36.0–46.0)
Hemoglobin: 10.8 g/dL — ABNORMAL LOW (ref 12.0–15.0)
MCH: 28.3 pg (ref 26.0–34.0)
MCHC: 30.9 g/dL (ref 30.0–36.0)
MCV: 91.6 fL (ref 78.0–100.0)
Platelets: 238 10*3/uL (ref 150–400)
RBC: 3.82 MIL/uL — AB (ref 3.87–5.11)
RDW: 14.5 % (ref 11.5–15.5)
WBC: 15.1 10*3/uL — ABNORMAL HIGH (ref 4.0–10.5)

## 2015-01-03 LAB — MAGNESIUM: Magnesium: 1.8 mg/dL (ref 1.5–2.5)

## 2015-01-03 MED ORDER — ENOXAPARIN (LOVENOX) PATIENT EDUCATION KIT
PACK | Freq: Once | Status: AC
  Administered 2015-01-03: 08:00:00
  Filled 2015-01-03: qty 1

## 2015-01-03 NOTE — Progress Notes (Signed)
PULMONARY / CRITICAL CARE MEDICINE   Name: Tamara RiggsDenise B Townsend MRN: [id number] DOB: 12/09/1965    ADMISSION DATE:  12/30/2014 CONSULTATION DATE:  12/30/2014   REFERRING MD :  Dr Gaynelle AduEric Wilson of [facility]  CHIEF COMPLAINT:  Post op hypoxemic acute resp failure  SIGNIFICANT EVENTS: 12/30/2014 - LAPAROSCOPIC ROUX-EN-Y GASTRIC BYPASS WITH HIATAL HERNIA REPAIR (N/A) INSERTION OF MESH (N/A) as a surgical intervention  01/11/15 - PCCM consulted for ongoing hypoxemia  HISTORY OF PRESENT ILLNESS:   49 year old morbidly obese female  [place]went LAPAROSCOPIC ROUX-EN-Y GASTRIC BYPASS WITH HIATAL HERNIA REPAIR (N/A) INSERTION OF MESH (N/A) as a surgical intervention and per her and RN reprot even in the immediate post op period had acute post op hypoxemia that only some improved despite ICU care. She is now in medical floor needing 4L O2 but feeling ok at rest. With exertion on RA she progressively desaturates to 69% on RA and only worsens with continued walking. Denies cough, fever, hemoptysis, chest pain, sputum, pedal edema. She says she has been on lovenox DVT proph since pod#1 atleast. No prior PE. Duplex LE poor study due to body habitus is negative for DVT. Otherwise well and aiming for discharge soon to home    SUBJECTIVE: Breathing much better. Cough no longer productive. Ambulating some.  VITAL SIGNS: Temp:  [97.6 F (36.4 C)-98.3 F (36.8 C)] 98 F (36.7 C) (02/06 1007) Pulse Rate:  [46-84] 84 (02/06 1029) Resp:  [18] 18 (02/06 1007) BP: (98-147)/(40-77) 135/58 mmHg (02/06 1007) SpO2:  [78 %-99 %] 94 % (02/06 1007) HEMODYNAMICS:   VENTILATOR SETTINGS:   INTAKE / OUTPUT:  Intake/Output Summary (Last 24 hours) at 01/03/15 1213 Last data filed at 01/03/15 1024  Gross per 24 hour  Intake   1250 ml  Output   5376 ml  Net  -4126 ml    PHYSICAL EXAMINATION: General:  Morbidly obese. Looks well, pleasant/ appropriate Neuro:  AXOX3. Speech normal HEENT:  Mallampatti class 3 Cardiovascular:   Normal heart sounds. No murmurs Lungs:  CTA bilaterally, shallow, unlabored Abdomen:  Obese, surgicl scar + Musculoskeletal:  No cyanosis. No clubbing Minimal chronic edema only.  Skin:  Intact anteriorly  LABS:  CBC  Recent Labs Lab 01/02/15 0500 01/02/15 1755 01/03/15 0540  WBC 16.9* 17.0* 15.1*  HGB 10.5* 11.3* 10.8*  HCT 34.4* 36.0 35.0*  PLT 222 243 238   Coag's No results for input(s): APTT, INR in the last 168 hours. BMET  Recent Labs Lab 12/31/14 0429 01/02/15 0500 01/03/15 0540  NA 137 138 139  K 3.8 3.9 3.5  CL 103 101 99  CO2 25 30 29   BUN 5* 8 8  CREATININE 0.49* 0.49* 0.42*  GLUCOSE 129* 121* 107*   Electrolytes  Recent Labs Lab 12/31/14 0429 01/02/15 0500 01/03/15 0540  CALCIUM 8.6 8.3* 8.5  MG  --   --  1.8  PHOS  --   --  3.3   Sepsis Markers  Recent Labs Lab 01/02/15 0500 01/02/15 1615 01/02/15 1616  LATICACIDVEN  --  1.1  --   PROCALCITON <0.10  --  <0.10   ABG No results for input(s): PHART, PCO2ART, PO2ART in the last 168 hours. Liver Enzymes  Recent Labs Lab 12/31/14 0429  AST 48*  ALT 48*  ALKPHOS 62  BILITOT 0.6  ALBUMIN 3.3*   Cardiac Enzymes  Recent Labs Lab 01/02/15 1616  TROPONINI <0.03   Glucose No results for input(s): GLUCAP in the last 168 hours.  Imaging  Ct Angio Chest Pe W/cm &/or Wo Cm  01/02/2015   CLINICAL DATA:  Shortness of breath. Three days status post gastric bypass procedure  EXAM: CT ANGIOGRAPHY CHEST WITH CONTRAST  TECHNIQUE: Multidetector CT imaging of the chest was performed using the standard protocol during bolus administration of intravenous contrast. Multiplanar CT image reconstructions and MIPs were obtained to evaluate the vascular anatomy.  CONTRAST:  OMNIPAQUE IOHEXOL 350 MG/ML SOLN  COMPARISON:  August 27, 2014  FINDINGS: There is no demonstrable pulmonary embolus. There is no thoracic aortic aneurysm or dissection.  There is diffuse alveolar opacity throughout the lungs  bilaterally.  There is no appreciable thoracic adenopathy. The pericardium is not thickened. Heart is upper normal in size.  In the visualized upper abdomen, no lesion is appreciable. There are no blastic or lytic bone lesions. Thyroid appears normal.  Review of the MIP images confirms the above findings.  IMPRESSION: No demonstrable pulmonary embolus.  Widespread alveolar opacity bilaterally. Suspect ARDS. An acute allergic type response is a differential consideration. Both entities may exist concurrently. Widespread infectious pneumonia is a third differential consideration.  These results were called by telephone at the time of interpretation on 01/02/2015 at 2:12 pm to Dr. Gaynelle Adu , who verbally acknowledged these results.   Electronically Signed   By: Bretta Bang M.D.   On: 01/02/2015 14:13     ASSESSMENT / PLAN:  PULMONARY  A:Acute Post OP Non resolving Hypoxemic Resp Failure - needing 4L o2   - ddx - PE, Atelectasis, Acute diast dysfn . Less likely pneumonia. CXR clear in September. Symptoms abruptly noted immediately after surgery. Now rapidly improved. Most common acute events would be aspiration or fluid overload. Sed rate is elevated 80, but immediately post-op, I don't know what that means. I would like to get her downstairs tomorrow AM for 2V CXR before decision to discharge.  P:   IV heparin empiric Rx  try diuresis and mobilziation Downstairs 2/7 AM for 2V CXR PCCM wil follow    CD Maple Hudson, MD Pulmonary and Critical Care Medicine Mobile [phone] Pager: [phone], If no answer or between  15:00h - 7:00h: call [phone]  01/03/2015 12:13 PM

## 2015-01-03 NOTE — Evaluation (Signed)
Occupational Therapy Evaluation Patient Details Name: [name] MRN: [id number] DOB: 07/02/1966 Today's Date: 01/03/2015    History of Present Illness LAPAROSCOPIC ROUX-EN-Y GASTRIC BYPASS WITH HIATAL HERNIA REPAIR AND UPPER ENDOSCOPY on 12/30/14   Clinical Impression   Pt was admitted for the above surgeries.  At baseline, she is independent with adls.  She currently needs supervision to min A for adls.  Husband will assist as needed, and pt may be able to perform at edge of her own bed.    Pt desaturated when walking to bathroom, but recovered quickly.  Will follow in acute setting for activity tolerance.    Follow Up Recommendations  No OT follow up   Equipment Recommendations  3 in 1 bedside comode (wide)    Recommendations for Other Services       Precautions / Restrictions Precautions Precautions: Fall Restrictions Weight Bearing Restrictions: No      Mobility Bed Mobility Overal bed mobility: Needs Assistance Bed Mobility: Sidelying to Sit   Sidelying to sit: Supervision       General bed mobility comments: used bedrail; demonstrated technique without use of bedrail  Transfers   Equipment used: Rolling walker (2 wheeled) Transfers: Sit to/from Stand Sit to Stand: Min guard;Supervision         General transfer comment: had standard walker initially; traded for wide walker    Balance                                            ADL Overall ADL's : Needs assistance/impaired     Grooming: Supervision/safety;Standing                   StatisticianToilet Transfer: Min guard;Ambulation;Comfort height toilet;Grab bars;RW;Requires wide/bariatric   Toileting- Clothing Manipulation and Hygiene: Set up;Sitting/lateral lean         General ADL Comments: pt usually brings leg up on bed for LB adls:  she is on an air mattress--unable to test:  may need min to mod A.  Pt would benefit from 3:1 over commode.  Educated on shower transfer, but did  not practice--hospital room has a much higher and deeper ledge than what she has.  Educated on energy conservation.  Sats dropped to 78% on 2 liters after walking to bathroom:  quickly returned to 91% (initially was 94%).  Pt verbalizes understanding.  Pt most concerned about getting back to carpooling     Vision                     Perception     Praxis      Pertinent Vitals/Pain Pain Assessment: No/denies pain     Hand Dominance     Extremity/Trunk Assessment Upper Extremity Assessment Upper Extremity Assessment: Overall WFL for tasks assessed           Communication Communication Communication: No difficulties   Cognition Arousal/Alertness: Awake/alert Behavior During Therapy: WFL for tasks assessed/performed Overall Cognitive Status: Within Functional Limits for tasks assessed                     General Comments       Exercises       Shoulder Instructions      Home Living Family/patient expects to be discharged to:: Private residence Living Arrangements: Spouse/significant other  Bathroom Shower/Tub: Producer, television/film/video: Standard     Home Equipment: Shower seat          Prior Functioning/Environment Level of Independence: Independent             OT Diagnosis: Generalized weakness   OT Problem List: Decreased activity tolerance;Cardiopulmonary status limiting activity   OT Treatment/Interventions: Self-care/ADL training;Patient/family education;Energy conservation;DME and/or AE instruction    OT Goals(Current goals can be found in the care plan section) Acute Rehab OT Goals Patient Stated Goal: get back to all activities; be able to do carpooling without pain/fatique ADL Goals Pt Will Transfer to Toilet: with modified independence;ambulating;bedside commode Additional ADL Goal #1: pt will verbalize 2 energy conservation techniques and initiate rest break/pursed lip breathing as needed  without cues  OT Frequency: Min 2X/week   Barriers to D/C:            Co-evaluation              End of Session    Activity Tolerance: Patient tolerated treatment well Patient left: in bed;with call bell/phone within reach (EOB, RN came in)   Time: 0725-0804 OT Time Calculation (min): 39 min Charges:  OT General Charges $OT Visit: 1 Procedure OT Evaluation $Initial OT Evaluation Tier I: 1 Procedure OT Treatments $Self Care/Home Management : 8-22 mins G-Codes:    Nichele Slawson January 18, 2015, 8:40 AM  Marica Otter, OTR/L [phone] 01/18/2015

## 2015-01-03 NOTE — Progress Notes (Signed)
4 Days Post-Op  Subjective: Feels ok this am. Working with PT. She still desats to 70's when up but recovers easily  Objective: Vital signs in last 24 hours: Temp:  [97.6 F (36.4 C)-98.3 F (36.8 C)] 97.6 F (36.4 C) (02/06 0458) Pulse Rate:  [69-77] 74 (02/06 0458) Resp:  [18] 18 (02/06 0458) BP: (98-147)/(40-77) 98/77 mmHg (02/06 0458) SpO2:  [90 %-99 %] 92 % (02/06 0458) Last BM Date: 12/31/14  Intake/Output from previous day: 02/05 0701 - 02/06 0700 In: 1679.2 [I.V.:1679.2] Out: 4375 [Urine:4375] Intake/Output this shift:    Resp: clear to auscultation bilaterally Cardio: regular rate and rhythm GI: soft, nontender  Lab Results:   Recent Labs  01/02/15 1755 01/03/15 0540  WBC 17.0* 15.1*  HGB 11.3* 10.8*  HCT 36.0 35.0*  PLT 243 238   BMET  Recent Labs  01/02/15 0500 01/03/15 0540  NA 138 139  K 3.9 3.5  CL 101 99  CO2 30 29  GLUCOSE 121* 107*  BUN 8 8  CREATININE 0.49* 0.42*  CALCIUM 8.3* 8.5   PT/INR No results for input(s): LABPROT, INR in the last 72 hours. ABG No results for input(s): PHART, HCO3 in the last 72 hours.  Invalid input(s): PCO2, PO2  Studies/Results: Ct Angio Chest Pe W/cm &/or Wo Cm  01/02/2015   CLINICAL DATA:  Shortness of breath. Three days status post gastric bypass procedure  EXAM: CT ANGIOGRAPHY CHEST WITH CONTRAST  TECHNIQUE: Multidetector CT imaging of the chest was performed using the standard protocol during bolus administration of intravenous contrast. Multiplanar CT image reconstructions and MIPs were obtained to evaluate the vascular anatomy.  CONTRAST:  100mL OMNIPAQUE IOHEXOL 350 MG/ML SOLN  COMPARISON:  August 27, 2014  FINDINGS: There is no demonstrable pulmonary embolus. There is no thoracic aortic aneurysm or dissection.  There is diffuse alveolar opacity throughout the lungs bilaterally.  There is no appreciable thoracic adenopathy. The pericardium is not thickened. Heart is upper normal in size.  In the  visualized upper abdomen, no lesion is appreciable. There are no blastic or lytic bone lesions. Thyroid appears normal.  Review of the MIP images confirms the above findings.  IMPRESSION: No demonstrable pulmonary embolus.  Widespread alveolar opacity bilaterally. Suspect ARDS. An acute allergic type response is a differential consideration. Both entities may exist concurrently. Widespread infectious pneumonia is a third differential consideration.  These results were called by telephone at the time of interpretation on 01/02/2015 at 2:12 pm to Dr. Gaynelle AduEric Wilson , who verbally acknowledged these results.   Electronically Signed   By: Bretta BangWilliam  Woodruff M.D.   On: 01/02/2015 14:13    Anti-infectives: Anti-infectives    Start     Dose/Rate Route Frequency Ordered Stop   01/02/15 1800  levofloxacin (LEVAQUIN) IVPB 750 mg     750 mg100 mL/hr over 90 Minutes Intravenous Every 24 hours 01/02/15 1622     12/30/14 0531  levofloxacin (LEVAQUIN) IVPB 750 mg     750 mg100 mL/hr over 90 Minutes Intravenous On call to O.R. 12/30/14 0531 12/30/14 0908      Assessment/Plan: s/p Procedure(s): LAPAROSCOPIC ROUX-EN-Y GASTRIC BYPASS WITH HIATAL HERNIA REPAIR AND UPPER ENDOSCOPY (N/A) would start the post bariatric surgery diet  Appreciate pulmonary assistance PT  LOS: 4 days    TOTH Townsend,Tamara S 01/03/2015

## 2015-01-04 ENCOUNTER — Inpatient Hospital Stay (HOSPITAL_COMMUNITY): Payer: Federal, State, Local not specified - PPO

## 2015-01-04 LAB — CBC
HCT: 39.6 % (ref 36.0–46.0)
Hemoglobin: 12.7 g/dL (ref 12.0–15.0)
MCH: 29 pg (ref 26.0–34.0)
MCHC: 32.1 g/dL (ref 30.0–36.0)
MCV: 90.4 fL (ref 78.0–100.0)
Platelets: 314 10*3/uL (ref 150–400)
RBC: 4.38 MIL/uL (ref 3.87–5.11)
RDW: 14.2 % (ref 11.5–15.5)
WBC: 14.2 10*3/uL — ABNORMAL HIGH (ref 4.0–10.5)

## 2015-01-04 MED ORDER — ENOXAPARIN SODIUM 30 MG/0.3ML ~~LOC~~ SOLN: 30.0000 mg | Freq: Two times a day (BID) | SUBCUTANEOUS | Status: DC

## 2015-01-04 MED ORDER — LEVOFLOXACIN 750 MG PO TABS: 750.0000 mg | ORAL_TABLET | Freq: Every day | ORAL | Status: AC

## 2015-01-04 MED ORDER — OXYCODONE HCL 5 MG/5ML PO SOLN: 5.0000 mg | ORAL | Status: DC | PRN

## 2015-01-04 NOTE — Progress Notes (Signed)
5 Days Post-Op  Subjective: Feels better today  Objective: Vital signs in last 24 hours: Temp:  [98 F (36.7 C)] 98 F (36.7 C) (02/07 0548) Pulse Rate:  [46-84] 78 (02/07 0548) Resp:  [18-20] 20 (02/07 0548) BP: (135-139)/(49-69) 137/49 mmHg (02/07 0548) SpO2:  [94 %-98 %] 98 % (02/07 0548) Last BM Date: 01/03/15  Intake/Output from previous day: 02/06 0701 - 02/07 0700 In: 3690 [I.V.:3540; IV Piggyback:150] Out: 4201 [Urine:4200; Stool:1] Intake/Output this shift:    Resp: clear to auscultation bilaterally Cardio: regular rate and rhythm GI: soft, nontender  Lab Results:   Recent Labs  01/03/15 0540 01/04/15 0621  WBC 15.1* 14.2*  HGB 10.8* 12.7  HCT 35.0* 39.6  PLT 238 314   BMET  Recent Labs  01/02/15 0500 01/03/15 0540  NA 138 139  K 3.9 3.5  CL 101 99  CO2 30 29  GLUCOSE 121* 107*  BUN 8 8  CREATININE 0.49* 0.42*  CALCIUM 8.3* 8.5   PT/INR No results for input(s): LABPROT, INR in the last 72 hours. ABG No results for input(s): PHART, HCO3 in the last 72 hours.  Invalid input(s): PCO2, PO2  Studies/Results: Ct Angio Chest Pe W/cm &/or Wo Cm  01/02/2015   CLINICAL DATA:  Shortness of breath. Three days status post gastric bypass procedure  EXAM: CT ANGIOGRAPHY CHEST WITH CONTRAST  TECHNIQUE: Multidetector CT imaging of the chest was performed using the standard protocol during bolus administration of intravenous contrast. Multiplanar CT image reconstructions and MIPs were obtained to evaluate the vascular anatomy.  CONTRAST:  100mL OMNIPAQUE IOHEXOL 350 MG/ML SOLN  COMPARISON:  August 27, 2014  FINDINGS: There is no demonstrable pulmonary embolus. There is no thoracic aortic aneurysm or dissection.  There is diffuse alveolar opacity throughout the lungs bilaterally.  There is no appreciable thoracic adenopathy. The pericardium is not thickened. Heart is upper normal in size.  In the visualized upper abdomen, no lesion is appreciable. There are no  blastic or lytic bone lesions. Thyroid appears normal.  Review of the MIP images confirms the above findings.  IMPRESSION: No demonstrable pulmonary embolus.  Widespread alveolar opacity bilaterally. Suspect ARDS. An acute allergic type response is a differential consideration. Both entities may exist concurrently. Widespread infectious pneumonia is a third differential consideration.  These results were called by telephone at the time of interpretation on 01/02/2015 at 2:12 pm to Dr. Gaynelle AduEric Wilson , who verbally acknowledged these results.   Electronically Signed   By: Bretta BangWilliam  Woodruff M.D.   On: 01/02/2015 14:13    Anti-infectives: Anti-infectives    Start     Dose/Rate Route Frequency Ordered Stop   01/02/15 1800  levofloxacin (LEVAQUIN) IVPB 750 mg     750 mg100 mL/hr over 90 Minutes Intravenous Every 24 hours 01/02/15 1622     12/30/14 0531  levofloxacin (LEVAQUIN) IVPB 750 mg     750 mg100 mL/hr over 90 Minutes Intravenous On call to O.R. 12/30/14 0531 12/30/14 0908      Assessment/Plan: s/p Procedure(s): LAPAROSCOPIC ROUX-EN-Y GASTRIC BYPASS WITH HIATAL HERNIA REPAIR AND UPPER ENDOSCOPY (N/A) CXR today per pulmonology. If improved then she may be able to go home  Post bariatric surgery diet  LOS: 5 days    TOTH III,PAUL S 01/04/2015

## 2015-01-04 NOTE — Progress Notes (Signed)
PULMONARY / CRITICAL CARE MEDICINE   Name: [name] MRN: [id number] DOB: 05/12/1966    ADMISSION DATE:  12/30/2014 CONSULTATION DATE:  12/30/2014   REFERRING MD :  Dr Gaynelle AduEric Wilson of [facility]  CHIEF COMPLAINT:  Post op hypoxemic acute resp failure  SIGNIFICANT EVENTS: 12/30/2014 - LAPAROSCOPIC ROUX-EN-Y GASTRIC BYPASS WITH HIATAL HERNIA REPAIR (N/A) INSERTION OF MESH (N/A) as a surgical intervention  01/11/15 - PCCM consulted for ongoing hypoxemia  HISTORY OF PRESENT ILLNESS:   49 year old morbidly obese female  udnerwent LAPAROSCOPIC ROUX-EN-Y GASTRIC BYPASS WITH HIATAL HERNIA REPAIR (N/A) INSERTION OF MESH (N/A) as a surgical intervention and per her and RN reprot even in the immediate post op period had acute post op hypoxemia that only some improved despite ICU care. She is now in medical floor needing 4L O2 but feeling ok at rest. With exertion on RA she progressively desaturates to 69% on RA and only worsens with continued walking. Denies cough, fever, hemoptysis, chest pain, sputum, pedal edema. She says she has been on lovenox DVT proph since pod#1 atleast. No prior PE. Duplex LE poor study due to body habitus is negative for DVT. Otherwise well and aiming for discharge soon to home    SUBJECTIVE:  Not coughing. Observed ambulating hall w sat 91% on room air  VITAL SIGNS: Temp:  [97.6 F (36.4 C)-98.3 F (36.8 C)] 98 F (36.7 C) (02/06 1007) Pulse Rate:  [46-84] 84 (02/06 1029) Resp:  [18] 18 (02/06 1007) BP: (98-147)/(40-77) 135/58 mmHg (02/06 1007) SpO2:  [78 %-99 %] 94 % (02/06 1007) HEMODYNAMICS:   VENTILATOR SETTINGS:   INTAKE / OUTPUT:  Intake/Output Summary (Last 24 hours) at 01/03/15 1213 Last data filed at 01/03/15 1024  Gross per 24 hour  Intake   1250 ml  Output   5376 ml  Net  -4126 ml    PHYSICAL EXAMINATION: General:  Morbidly obese. Looks well, pleasant/ appropriate Neuro:  AXOX3. Speech normal HEENT:  [place] class 3 Cardiovascular:  Normal  heart sounds. No murmurs Lungs:  CTA bilaterally, shallow, unlabored Abdomen:  Obese, Musculoskeletal:  No cyanosis. No clubbing Minimal chronic edema only.  Skin:  Intact anteriorly  LABS:  CBC  Recent Labs Lab 01/02/15 0500 01/02/15 1755 01/03/15 0540  WBC 16.9* 17.0* 15.1*  HGB 10.5* 11.3* 10.8*  HCT 34.4* 36.0 35.0*  PLT 222 243 238   Coag's No results for input(s): APTT, INR in the last 168 hours. BMET  Recent Labs Lab 12/31/14 0429 01/02/15 0500 01/03/15 0540  NA 137 138 139  K 3.8 3.9 3.5  CL 103 101 99  CO2 25 30 29   BUN 5* 8 8  CREATININE 0.49* 0.49* 0.42*  GLUCOSE 129* 121* 107*   Electrolytes  Recent Labs Lab 12/31/14 0429 01/02/15 0500 01/03/15 0540  CALCIUM 8.6 8.3* 8.5  MG  --   --  1.8  PHOS  --   --  3.3   Sepsis Markers  Recent Labs Lab 01/02/15 0500 01/02/15 1615 01/02/15 1616  LATICACIDVEN  --  1.1  --   PROCALCITON <0.10  --  <0.10   ABG No results for input(s): PHART, PCO2ART, PO2ART in the last 168 hours. Liver Enzymes  Recent Labs Lab 12/31/14 0429  AST 48*  ALT 48*  ALKPHOS 62  BILITOT 0.6  ALBUMIN 3.3*   Cardiac Enzymes  Recent Labs Lab 01/02/15 1616  TROPONINI <0.03   Glucose No results for input(s): GLUCAP in the last 168 hours.  Imaging  Ct Angio Chest Pe W/cm &/or Wo Cm  01/02/2015   CLINICAL DATA:  Shortness of breath. Three days status post gastric bypass procedure  EXAM: CT ANGIOGRAPHY CHEST WITH CONTRAST  TECHNIQUE: Multidetector CT imaging of the chest was performed using the standard protocol during bolus administration of intravenous contrast. Multiplanar CT image reconstructions and MIPs were obtained to evaluate the vascular anatomy.  CONTRAST:  OMNIPAQUE IOHEXOL 350 MG/ML SOLN  COMPARISON:  August 27, 2014  FINDINGS: There is no demonstrable pulmonary embolus. There is no thoracic aortic aneurysm or dissection.  There is diffuse alveolar opacity throughout the lungs bilaterally.  There is  no appreciable thoracic adenopathy. The pericardium is not thickened. Heart is upper normal in size.  In the visualized upper abdomen, no lesion is appreciable. There are no blastic or lytic bone lesions. Thyroid appears normal.  Review of the MIP images confirms the above findings.  IMPRESSION: No demonstrable pulmonary embolus.  Widespread alveolar opacity bilaterally. Suspect ARDS. An acute allergic type response is a differential consideration. Both entities may exist concurrently. Widespread infectious pneumonia is a third differential consideration.  These results were called by telephone at the time of interpretation on 01/02/2015 at 2:12 pm to Dr. Gaynelle Adu , who verbally acknowledged these results.   Electronically Signed   By: Bretta Bang M.D.   On: 01/02/2015 14:13   CXR 2/7- images reviewed- bilateral infiltrates c/w pneumonia vs edema  ASSESSMENT / PLAN:  PULMONARY  A:Acute Post OP Non resolving Hypoxemic Resp Failure - needing 4L o2   - ddx - PE, Atelectasis, Acute diast dysfn . Less likely pneumonia. CXR clear in September. Symptoms abruptly noted immediately after surgery. Now rapidly improved. Most common acute events would be aspiration or fluid overload.   2/7- Improved. WBC coming down. I think she can go home to progressive activity as tolerated  P:   Extend antibiotic levaquin 500 mg daily x 7 days See PCP Dr Carlota Raspberry in 7-10 days, asking for CXR then to f/u pneumonia Agree w use of lovenox until sees Dr Zachery Dauer    Terisa Starr, MD Pulmonary and Critical Care Medicine Mobile [phone] Pager: [phone], If no answer or between  15:00h - 7:00h: call [phone]  01/03/2015 12:13 PM

## 2015-01-04 NOTE — Progress Notes (Signed)
Discharge instructions discussed with patient and husband. Voice understanding of instructions including how to self administer lovenox injections. Assessment unchanged.

## 2015-01-06 ENCOUNTER — Telehealth (HOSPITAL_COMMUNITY): Payer: Self-pay

## 2015-01-06 ENCOUNTER — Ambulatory Visit: Payer: Federal, State, Local not specified - PPO

## 2015-01-06 ENCOUNTER — Encounter (HOSPITAL_COMMUNITY): Payer: Self-pay

## 2015-01-06 NOTE — Telephone Encounter (Signed)

## 2015-01-13 ENCOUNTER — Encounter: Payer: Federal, State, Local not specified - PPO | Attending: General Surgery | Admitting: Dietician

## 2015-01-13 VITALS — Ht 69.0 in | Wt >= 6400 oz

## 2015-01-13 DIAGNOSIS — Z6841 Body Mass Index (BMI) 40.0 and over, adult: Secondary | ICD-10-CM | POA: Insufficient documentation

## 2015-01-13 DIAGNOSIS — Z713 Dietary counseling and surveillance: Secondary | ICD-10-CM | POA: Insufficient documentation

## 2015-01-13 NOTE — Progress Notes (Signed)
Bariatric Appointment:  Appt start time: 1520 end time:  1600.  2 Week Post-Operative Nutrition Appointment  Patient was seen on 01/13/2015 for Post-Operative Nutrition education at [facility].   Surgery date: 12/30/2014 Surgery type: RYGB Start weight at [facility]: 448 lbs on 09/03/14 Weight today: 425.5 Weight loss: 19.5 lbs, 25.5 lbs fat mass loss  TANITA  BODY COMP RESULTS  12/15/14 01/13/15   BMI (kg/m^2) 65.7 62.8   Fat Mass (lbs) 281 255.5   Fat Free Mass (lbs) 164 170.0   Total Body Water (lbs) 120 124.5    The following the learning objectives were met by the patient during this course:  Identifies Phase 3A (Soft, High Proteins) Dietary Goals and will begin from 2 weeks post-operatively to 2 months post-operatively  Identifies appropriate sources of fluids and proteins   States protein recommendations and appropriate sources post-operatively  Identifies the need for appropriate texture modifications, mastication, and bite sizes when consuming solids  Identifies appropriate multivitamin and calcium sources post-operatively  Describes the need for physical activity post-operatively and will follow MD recommendations  States when to call healthcare provider regarding medication questions or post-operative complications  Handouts given during class include:  Phase 3A: Soft, High Protein Diet Handout  Follow-Up Plan: Patient will follow-up at [facility] in 6 weeks for 2 month post-op nutrition visit for diet advancement per MD.

## 2015-01-29 DIAGNOSIS — M170 Bilateral primary osteoarthritis of knee: Secondary | ICD-10-CM | POA: Diagnosis present

## 2015-01-29 NOTE — Discharge Summary (Signed)
Physician Discharge Summary  Tamara RiggsDenise B Kyler ZOX:[id number] DOB: 09/23/1966 DOA: 12/30/2014  PCP: Gaye AlkenBARNES,[name] STEWART, MD  Admit date: 12/30/2014 Discharge date: 01/04/2015  Recommendations for Outpatient Follow-up:    Follow-up Information    Follow up with Atilano InaWILSON,Mauriana Dann M, MD On 01/15/2015.   Specialty:  General Surgery   Why:  11AM (arrive 10:45 AM) , For wound re-check   Contact information:   [address] [place] [address] [phone]      Discharge Diagnoses:  Active Problems:   HTN (hypertension)   Morbid obesity with BMI of 60.0-69.9, adult   Acute postoperative respiratory failure   Acute respiratory failure with hypoxemia   Pulmonary infiltrates   Osteoarthritis of both knees   Surgical Procedure: Laparoscopic Roux-en-Y gastric bypass with hiatal hernia repair 12/30/14, upper endoscopy  Consults: pulmonary critical care medicine Discharge Condition: Good Disposition: Home  Diet recommendation: Postoperative gastric bypass diet  Filed Weights   01/01/15 0600 01/02/15 0547 01/03/15 0750  Weight: 465 lb (210.923 kg) 465 lb (210.922 kg) 440 lb 1.6 oz (199.628 kg)     Hospital Course:  The patient was admitted for a planned laparoscopic Roux-en-Y gastric bypass and possible hiatal hernia repair. Please see operative note. Preoperatively the patient was given 5000 units of subcutaneous heparin for DVT prophylaxis. Postoperative prophylactic Lovenox dosing was started on the morning of postoperative day 1. The patient underwent an upper GI on postoperative day 1 which demonstrated no extravasation of contrast and emptying of the contrast into the Roux limb. The patient was started on ice chips and water which they tolerated. The patient had a persistent oxygen requirement after surgery. She would desat on ambulation and without oxygen supplementation. On postoperative day 2 The patient's diet was advanced to protein shakes which they also tolerated. The  patient was ambulating without difficulty. Their vital signs are stable without fever or tachycardia. Their hemoglobin had remained stable. Because of her persistent oxygen requirement, pulmonary and critical care medicine was consulted. A CT PET scan was performed which demonstrated no evidence of pulmonary emboli however she had bilateral air space disease. She clinically was well appearing. She was nontoxic appearing. She was diuresed. Empiric Levaquin was started. Ultimately her oxygen requirement improved and pulmonary critical care medicine felt she was stable for discharge. She was discharged home on postoperative day 5 by one of my partners. The patient had received discharge instructions and counseling. They were deemed stable for discharge. Because of her BMI she was sent out on continue DVT prophylaxis of Lovenox.   Discharge Instructions      Discharge Instructions    Call MD for:  difficulty breathing, headache or visual disturbances    Complete by:  As directed      Call MD for:  extreme fatigue    Complete by:  As directed      Call MD for:  hives    Complete by:  As directed      Call MD for:  persistant dizziness or light-headedness    Complete by:  As directed      Call MD for:  persistant nausea and vomiting    Complete by:  As directed      Call MD for:  redness, tenderness, or signs of infection (pain, swelling, redness, odor or green/yellow discharge around incision site)    Complete by:  As directed      Call MD for:  severe uncontrolled pain    Complete by:  As directed  Call MD for:  temperature >100.4    Complete by:  As directed      Diet - low sodium heart healthy    Complete by:  As directed      Discharge instructions    Complete by:  As directed   May shower. No heavy lifting. Diet per protocol     Increase activity slowly    Complete by:  As directed      No wound care    Complete by:  As directed             Medication List    STOP taking  these medications        desogestrel-ethinyl estradiol 0.15-0.02/0.01 MG (21/5) tablet  Commonly known as:  AZURETTE      TAKE these medications        diphenhydramine-acetaminophen 25-500 MG Tabs  Commonly known as:  TYLENOL PM  Take 1 tablet by mouth at bedtime as needed.     enoxaparin 30 MG/0.3ML injection  Commonly known as:  LOVENOX  Inject 0.3 mLs (30 mg total) into the skin every 12 (twelve) hours.     FLUoxetine 20 MG capsule  Commonly known as:  PROZAC  Take 60 mg by mouth every morning.     POLY-IRON 150 FORTE PO  Take 1 capsule by mouth daily.     Iron Polysacch Cmplx-B12-FA 150-0.025-1 MG Caps  Commonly known as:  POLY-IRON 150 FORTE  TAKE ONE CAPSULE BY MOUTH EVERY DAY     metoprolol succinate 100 MG 24 hr tablet  Commonly known as:  TOPROL-XL  Take 200 mg by mouth every morning. Take with or immediately following a meal.     multivitamin with minerals Tabs tablet  Take 1 tablet by mouth daily.     oxyCODONE 5 MG/5ML solution  Commonly known as:  ROXICODONE  Take 5-10 mLs (5-10 mg total) by mouth every 4 (four) hours as needed for moderate pain or severe pain.     VESTURA 3-0.02 MG tablet  Generic drug:  drospirenone-ethinyl estradiol  Take 1 tablet by mouth daily.     Vitamin D3 5000 UNITS Tabs  Take 1 tablet by mouth daily.       Follow-up Information    Follow up with [name], MD On 01/15/2015.   Specialty:  General Surgery   Why:  11AM (arrive 10:45 AM) , For wound re-check   Contact information:   [address] [place] [address] [phone]        The results of significant diagnostics from this hospitalization (including imaging, microbiology, ancillary and laboratory) are listed below for reference.    Significant Diagnostic Studies: Dg Chest 2 View  01/04/2015   CLINICAL DATA:  Pulmonary infiltrates  EXAM: CHEST  2 VIEW  COMPARISON:  CTA chest dated 01/02/2015  FINDINGS: Multifocal patchy opacities, right lung  predominant, suspicious for multifocal pneumonia or less likely edema. No pleural effusion or pneumothorax.  The heart is top-normal in size.  Mild degenerative changes of the visualized thoracolumbar spine.  IMPRESSION: Multifocal patchy opacities, right lung predominant, suspicious for multifocal pneumonia.   Electronically Signed   By: Charline Bills M.D.   On: 01/04/2015 10:22   Ct Angio Chest Pe W/cm &/or Wo Cm  01/02/2015   CLINICAL DATA:  Shortness of breath. Three days status post gastric bypass procedure  EXAM: CT ANGIOGRAPHY CHEST WITH CONTRAST  TECHNIQUE: Multidetector CT imaging of the chest was performed using the  standard protocol during bolus administration of intravenous contrast. Multiplanar CT image reconstructions and MIPs were obtained to evaluate the vascular anatomy.  CONTRAST:  OMNIPAQUE IOHEXOL 350 MG/ML SOLN  COMPARISON:  August 27, 2014  FINDINGS: There is no demonstrable pulmonary embolus. There is no thoracic aortic aneurysm or dissection.  There is diffuse alveolar opacity throughout the lungs bilaterally.  There is no appreciable thoracic adenopathy. The pericardium is not thickened. Heart is upper normal in size.  In the visualized upper abdomen, no lesion is appreciable. There are no blastic or lytic bone lesions. Thyroid appears normal.  Review of the MIP images confirms the above findings.  IMPRESSION: No demonstrable pulmonary embolus.  Widespread alveolar opacity bilaterally. Suspect ARDS. An acute allergic type response is a differential consideration. Both entities may exist concurrently. Widespread infectious pneumonia is a third differential consideration.  These results were called by telephone at the time of interpretation on 01/02/2015 at 2:12 pm to Dr. Gaynelle Adu , who verbally acknowledged these results.   Electronically Signed   By: Bretta Bang M.D.   On: 01/02/2015 14:13   Dg Kayleen Memos W/water Sol Cm  12/31/2014   CLINICAL DATA:  Postop day 1 gastric  bypass and hernia repair.  EXAM: WATER SOLUBLE UPPER GI SERIES  TECHNIQUE: Single-column upper GI series was performed using water soluble contrast.  CONTRAST:  50mL OMNIPAQUE IOHEXOL 300 MG/ML  SOLN  COMPARISON:  None.  FLUOROSCOPY TIME:  Radiation Exposure Index (as provided by the fluoroscopic device):  If the device does not provide the exposure index:  Fluoroscopy Time (in minutes and seconds):  1 min 44 seconds  Number of Acquired Images:  7  FINDINGS: Scout view of the abdomen shows mildly prominent gas-filled loops of bowel. Surgical clips in the right upper quadrant.  Patient drank 50 cc of water-soluble contrast. There are postoperative changes of gastric bypass. Contrast flows readily into the proximal small bowel. No leak.  IMPRESSION: Postoperative changes of gastric bypass without complicating feature.   Electronically Signed   By: Leanna Battles M.D.   On: 12/31/2014 10:38    Labs: Basic Metabolic Panel: No results for input(s): NA, K, CL, CO2, GLUCOSE, BUN, CREATININE, CALCIUM, MG, PHOS in the last 168 hours. Liver Function Tests: No results for input(s): AST, ALT, ALKPHOS, BILITOT, PROT, ALBUMIN in the last 168 hours.  CBC: No results for input(s): WBC, NEUTROABS, HGB, HCT, MCV, PLT in the last 168 hours.  CBG: No results for input(s): GLUCAP in the last 168 hours.  Active Problems:   HTN (hypertension)   Morbid obesity with BMI of 60.0-69.9, adult   Acute postoperative respiratory failure   Acute respiratory failure with hypoxemia   Pulmonary infiltrates   Osteoarthritis of both knees   Time coordinating discharge: 15 minutes  Signed:  [name], MD [facility], [place] [phone] 01/29/2015, 3:02 PM

## 2015-02-24 ENCOUNTER — Encounter: Payer: Federal, State, Local not specified - PPO | Attending: General Surgery | Admitting: Dietician

## 2015-02-24 VITALS — Ht 69.0 in | Wt 396.0 lb

## 2015-02-24 DIAGNOSIS — Z6841 Body Mass Index (BMI) 40.0 and over, adult: Secondary | ICD-10-CM | POA: Insufficient documentation

## 2015-02-24 DIAGNOSIS — Z713 Dietary counseling and surveillance: Secondary | ICD-10-CM | POA: Insufficient documentation

## 2015-02-24 NOTE — Patient Instructions (Addendum)
Goals:  Follow Phase 3B: High Protein + Non-Starchy Vegetables  Eat 3-6 small meals/snacks, every 3-5 hrs  Increase lean protein foods to meet 60g goal  Increase fluid intake to 64oz + (sip every few minutes)  Avoid drinking 15 minutes before, during and 30 minutes after eating  Aim for >30 min of physical activity daily

## 2015-02-24 NOTE — Progress Notes (Signed)
  Follow-up visit:  8 Weeks Post-Operative RYGB Surgery  Medical Nutrition Therapy:  Appt start time: 0830 end time:  0910.  Primary concerns today: Post-operative Bariatric Surgery Nutrition Management. Started to feel back to herself in the past 2 weeks. Has some problems tolerating chicken which is making her nauseas. Tolerating fish very well. Has gotten tired of eggs.   Surgery date: 12/30/2014 Surgery type: RYGB [facility] lbs on 09/03/14 Weight today: 396.0 lbs  Weight loss: 29.5 lbs Total weight loss: 52 lbs  TANITA  BODY COMP RESULTS  12/15/14 01/13/15 02/24/15   BMI (kg/m^2) 65.7 62.8 58.5   Fat Mass (lbs) 281 255.5 239.5   Fat Free Mass (lbs) 164 170.0 156.5   Total Body Water (lbs) 120 124.5 114.5    Preferred Learning Style:   No preference indicated   Learning Readiness:   Ready  24-hr recall: B (AM): 2 oz ham or 1.5 eggs or [place]key sausage (10-14 g) Snk (AM): lunch meat ([place]key or ham or cheese) (7 g) L (PM): tuna packet, lunch meat, leftovers)  (14 g) Snk (PM): lunch meat ([place]key or ham or cheese) (7 g) D (PM): 2-3 oz fish (mostly), shrimp/scallops, chicken, pork chops, hamburger with spaghetti squash or chili (14-21 g) Snk (PM): none  Fluid intake: water with flavoring 51 oz sometimes with a 12 oz can of sugar free lemonade (63 oz) Estimated total protein intake: 52 - 63 g   Medications: see list  Supplementation:  taking  Using straws: No Drinking while eating: every once in a while  Hair loss: No Carbonated beverages: No N/V/D/C: nausea sometimes about 1 x week when she has chicken, occasional diarrhea  Dumping syndrome: No  Recent physical activity:  Walking 10 minutes each day  Progress Towards Goal(s):  In progress.  Handouts given during visit include:  Phase 3B High Protein + Non Starchy Vegetables   Nutritional Diagnosis:  [place]-3.3 Overweight/obesity related to past poor dietary habits and physical inactivity as evidenced by  patient w/ recent RYGB surgery following dietary guidelines for continued weight loss.    Intervention:  Nutrition education/diet advancement Goals:  Follow Phase 3B: High Protein + Non-Starchy Vegetables  Eat 3-6 small meals/snacks, every 3-5 hrs  Increase lean protein foods to meet 60g goal  Increase fluid intake to 64oz + (sip every few minutes)  Avoid drinking 15 minutes before, during and 30 minutes after eating  Aim for >30 min of physical activity daily  Teaching Method Utilized:  Visual Auditory Hands on  Barriers to learning/adherence to lifestyle change: none  Demonstrated degree of understanding via:  Teach Back   Monitoring/Evaluation:  Dietary intake, exercise, and body weight. Follow up in 3 months for 5 month post-op visit.

## 2015-05-27 ENCOUNTER — Encounter: Payer: Federal, State, Local not specified - PPO | Attending: General Surgery | Admitting: Dietician

## 2015-05-27 ENCOUNTER — Ambulatory Visit: Payer: Federal, State, Local not specified - PPO | Admitting: Dietician

## 2015-05-27 VITALS — Ht 69.0 in | Wt 358.5 lb

## 2015-05-27 DIAGNOSIS — Z713 Dietary counseling and surveillance: Secondary | ICD-10-CM | POA: Diagnosis not present

## 2015-05-27 DIAGNOSIS — Z6841 Body Mass Index (BMI) 40.0 and over, adult: Secondary | ICD-10-CM | POA: Insufficient documentation

## 2015-05-27 NOTE — Progress Notes (Signed)
  Follow-up visit:  4.5 months Post-Operative RYGB Surgery  Medical Nutrition Therapy:  Appt start time: 335 end time: 410   Primary concerns today: Post-operative Bariatric Surgery Nutrition Management.  Angelique BlonderDenise returns having lost another 37.5 pounds. She reports feeling good overall and her jewelry is getting too big. She feels like she is tired of the food she is eating. Sticking with mainly lean protein and non starchy vegetables.   Surgery date: 12/30/2014 Surgery type: RYGB [facility] lbs on 09/03/14 Weight today: 358.5 lbs Weight loss: 37.5 lbs Total weight loss: 89.5 lbs  TANITA  BODY COMP RESULTS  12/15/14 01/13/15 02/24/15 05/27/15   BMI (kg/m^2) 65.7 62.8 58.5 52.9   Fat Mass (lbs) 281 255.5 239.5 203   Fat Free Mass (lbs) 164 170.0 156.5 155.5   Total Body Water (lbs) 120 124.5 114.5 114    Preferred Learning Style:   No preference indicated   Learning Readiness:   Ready  24-hr recall: B (AM): 2 oz ham or 1.5-2 eggs or [place]key sausage (10-14 g) Snk (AM): lunch meat ([place]key or ham or cheese) (7 g) L (PM): tuna packet, lunch meat, leftovers)  (14 g) Snk (PM): lunch meat ([place]key or ham or cheese) (7 g) D (PM): 3 oz fish (mostly), shrimp/scallops, chicken, pork chops, hamburger with spaghetti squash or chili (21 g) Snk (PM): none  Fluid intake: water with flavoring 51 oz sometimes with a 12 oz can of sugar free lemonade (63 oz) Estimated total protein intake: 52 - 63 g   Medications: see list  Supplementation:  taking  Using straws: No Drinking while eating: every once in a while  Hair loss: No Carbonated beverages: No N/V/D/C: none Dumping syndrome: No  Recent physical activity:  Walking and just started water aerobics class  Progress Towards Goal(s):  In progress.  Handouts given during visit include:  Bariatric snack list   Nutritional Diagnosis:  [place]-3.3 Overweight/obesity related to past poor dietary habits and physical inactivity as  evidenced by patient w/ recent RYGB surgery following dietary guidelines for continued weight loss.    Intervention:  Nutrition education/diet advancement Goals:  Follow Phase 3B: High Protein + Non-Starchy Vegetables  Eat 3-6 small meals/snacks, every 3-5 hrs  Increase lean protein foods to meet 60g goal  Increase fluid intake to 64oz + (sip every few minutes)  Avoid drinking 15 minutes before, during and 30 minutes after eating  Aim for >30 min of physical activity daily  Teaching Method Utilized:  Visual Auditory Hands on  Barriers to learning/adherence to lifestyle change: none  Demonstrated degree of understanding via:  Teach Back   Monitoring/Evaluation:  Dietary intake, exercise, and body weight. Follow up in 3 months for 7 month post-op visit.

## 2015-05-27 NOTE — Patient Instructions (Signed)
  Surgery date: 12/30/2014 Surgery type: RYGB [facility] lbs on 09/03/14 Weight today: 358.5 lbs Weight loss: 37.5 lbs Total weight loss: 89.5 lbs  TANITA  BODY COMP RESULTS  12/15/14 01/13/15 02/24/15 05/27/15   BMI (kg/m^2) 65.7 62.8 58.5 52.9   Fat Mass (lbs) 281 255.5 239.5 203   Fat Free Mass (lbs) 164 170.0 156.5 155.5   Total Body Water (lbs) 120 124.5 114.5 114

## 2015-05-28 ENCOUNTER — Encounter: Payer: Self-pay | Admitting: Dietician

## 2015-08-27 ENCOUNTER — Encounter: Payer: Self-pay | Admitting: Dietician

## 2015-08-27 ENCOUNTER — Encounter: Payer: Federal, State, Local not specified - PPO | Attending: General Surgery | Admitting: Dietician

## 2015-08-27 VITALS — Wt 337.2 lb

## 2015-08-27 DIAGNOSIS — Z6841 Body Mass Index (BMI) 40.0 and over, adult: Secondary | ICD-10-CM | POA: Diagnosis not present

## 2015-08-27 DIAGNOSIS — Z713 Dietary counseling and surveillance: Secondary | ICD-10-CM | POA: Diagnosis not present

## 2015-08-27 NOTE — Progress Notes (Signed)
  Follow-up visit:  8 months Post-Operative RYGB Surgery  Medical Nutrition Therapy:  Appt start time: 205 end time: 240  Primary concerns today: Post-operative Bariatric Surgery Nutrition Management.  [name] returns having lost another 20 pounds in the last 3 months. She reports feeling good but she is losing her hair. She has more energy, sleeping better, and able to exercise. Still having problems with knees but this is improving. Blood pressure is lower.  Surgery date: 12/30/2014 Surgery type: RYGB Start weight at [facility]: 448 lbs on 09/03/14 Weight today: 337.2 lbs Weight loss: 21.3 lbs Total weight loss: 110.8 lbs  TANITA  BODY COMP RESULTS  12/15/14 01/13/15 02/24/15 05/27/15 08/27/15   BMI (kg/m^2) 65.7 62.8 58.5 52.9 N/A   Fat Mass (lbs) 281 255.5 239.5 203    Fat Free Mass (lbs) 164 170.0 156.5 155.5    Total Body Water (lbs) 120 124.5 114.5 114     Preferred Learning Style:   No preference indicated   Learning Readiness:   Ready  24-hr recall: B (AM): 2 eggs with cheese (14g) Snk (AM): string cheese (7g) L (PM): salad with 3 oz leftover steak or chicken (21g) Snk (PM): deli ham (7 g) D (PM): 3 oz meat with broccoli, green beans, or a salad (21g) Snk (PM): none  Fluid intake: water with flavoring 51 oz sometimes with a 12 oz can of sugar free lemonade (63 oz) Estimated total protein intake: 70 g   Medications: see list  Supplementation:  taking  Using straws: No Drinking while eating: every once in a while  Hair loss: yes Carbonated beverages: No N/V/D/C: some nausea with foods that have a sweet sauce Dumping syndrome: No  Recent physical activity:  Water aerobics 3x a week  Progress Towards Goal(s):  In progress.   Nutritional Diagnosis:  [place]-3.3 Overweight/obesity related to past poor dietary habits and physical inactivity as evidenced by patient w/ recent RYGB surgery following dietary guidelines for continued weight loss.    Intervention:  Nutrition  education provided.  Teaching Method Utilized:  Visual Auditory Hands on  Barriers to learning/adherence to lifestyle change: none  Demonstrated degree of understanding via:  Teach Back   Monitoring/Evaluation:  Dietary intake, exercise, and body weight. Follow up in 3 months for 11 month post-op visit.

## 2015-08-27 NOTE — Patient Instructions (Addendum)
  Surgery date: 12/30/2014 Surgery type: RYGB Start weight at [facility]: 448 lbs on 09/03/14 Weight today: 337.2 lbs Weight loss: 21.3 lbs Total weight loss: 110.8 lbs

## 2015-11-26 ENCOUNTER — Encounter: Payer: Self-pay | Admitting: Dietician

## 2015-11-26 ENCOUNTER — Encounter: Payer: Federal, State, Local not specified - PPO | Attending: General Surgery | Admitting: Dietician

## 2015-11-26 VITALS — Ht 69.0 in | Wt 330.0 lb

## 2015-11-26 DIAGNOSIS — Z713 Dietary counseling and surveillance: Secondary | ICD-10-CM | POA: Diagnosis not present

## 2015-11-26 DIAGNOSIS — Z6841 Body Mass Index (BMI) 40.0 and over, adult: Secondary | ICD-10-CM | POA: Insufficient documentation

## 2015-11-26 NOTE — Progress Notes (Signed)
  Follow-up visit:  10 months Post-Operative RYGB Surgery  Medical Nutrition Therapy:  Appt start time: 205 end time: 235  Primary concerns today: Post-operative Bariatric Surgery Nutrition Management.  [name] returns having lost another 7 pounds in the last 3 months. Has noticed that her weight loss is slow but states she is okay with it. Has not been able to exercise in the pool in the last few weeks due to pool renovations. Trying to walk more. Took her own snacks to Christmas family gathering.   Surgery date: 12/30/2014 Surgery type: RYGB [facility] lbs on 09/03/14 Weight today: 330 lbs Weight loss: 7.2 lbs Total weight loss: 118 lbs Goal: 245 lbs  TANITA  BODY COMP RESULTS  12/15/14 01/13/15 02/24/15 05/27/15 08/27/15 11/26/15   BMI (kg/m^2) 65.7 62.8 58.5 52.9 N/A 48.7   Fat Mass (lbs) 281 255.5 239.5 203  178   Fat Free Mass (lbs) 164 170.0 156.5 155.5  152   Total Body Water (lbs) 120 124.5 114.5 114  111.5    Preferred Learning Style:   No preference indicated   Learning Readiness:   Ready  24-hr recall: B (AM): 2 eggs(14g) Snk (AM): L (PM): salad with 3 oz leftover steak or chicken (21g) Snk (PM): cheese or cottage cheese or deli meat (7g) D (PM): 3 oz fish with broccoli, green beans, or a salad (21g) Snk (PM): none  Fluid intake: water with flavoring, sugar free lemonade (80 oz total per patient report) Estimated total protein intake: 63 g   Medications: see list  Supplementation:  taking  Using straws: No Drinking while eating: every once in a while  Hair loss: yes, improving and having some new growth Carbonated beverages: No N/V/D/C: some nausea with foods that have a sweet sauce Dumping syndrome: No  Recent physical activity:  Water aerobics 3x a week  Progress Towards Goal(s):  In progress.   Nutritional Diagnosis:  [place]-3.3 Overweight/obesity related to past poor dietary habits and physical inactivity as evidenced by patient w/ recent  RYGB surgery following dietary guidelines for continued weight loss.    Intervention:  Nutrition education provided.  Teaching Method Utilized:  Visual Auditory Hands on  Barriers to learning/adherence to lifestyle change: none  Demonstrated degree of understanding via:  Teach Back   Monitoring/Evaluation:  Dietary intake, exercise, and body weight. Follow up in 3 months for 13 month post-op visit.

## 2015-11-26 NOTE — Patient Instructions (Addendum)
  Surgery date: 12/30/2014 Surgery type: [facility]GB [facility] lbs on 09/03/14 Weight today: 330 lbs Weight loss: 7.2 lbs Total weight loss: 118 lbs Goal: 245 lbs   TANITA  BODY COMP RESULTS  12/15/14 01/13/15 02/24/15 05/27/15 08/27/15 11/26/15   BMI (kg/m^2) 65.7 62.8 58.5 52.9 N/A 48.7   Fat Mass (lbs) 281 255.5 239.5 203  178   Fat Free Mass (lbs) 164 170.0 156.5 155.5  152   Total Body Water (lbs) 120 124.5 114.5 114  111.5

## 2016-02-24 ENCOUNTER — Encounter: Payer: Federal, State, Local not specified - PPO | Attending: General Surgery | Admitting: Dietician

## 2016-02-24 ENCOUNTER — Encounter: Payer: Self-pay | Admitting: Dietician

## 2016-02-24 DIAGNOSIS — Z029 Encounter for administrative examinations, unspecified: Secondary | ICD-10-CM | POA: Insufficient documentation

## 2016-02-24 NOTE — Patient Instructions (Addendum)
  Surgery date: 12/30/2014 Surgery [facility]GB [facility] lbs on 09/03/14 Weight today: 329.5 lbs Weight loss: 0.5 lbs Total weight loss: 118 lbs Goal: under 300 lbs   -Try Down Dog yoga app

## 2016-02-24 NOTE — Progress Notes (Signed)
  Follow-up visit:  14 months Post-Operative RYGB Surgery  Medical Nutrition Therapy:  Appt start time: 935 end time: 1000  Primary concerns today: Post-operative Bariatric Surgery Nutrition Management.  [name] returns having maintained her weight. She reports frustration that her weight loss has stopped. She has continued to comply with her diet. Just got a chair yoga tape and plans to include this in her workout routine in addition to water aerobics.   Surgery date: 12/30/2014 Surgery type: [facility]YGB [facility] lbs on 09/03/14 Weight today: 329.5 lbs Weight loss: 0.5 lbs Total weight loss: 118 lbs Goal: under 300 lbs  TANITA  BODY COMP RESULTS  12/15/14 01/13/15 02/24/15 05/27/15 08/27/15 11/26/15 02/24/16   BMI (kg/m^2) 65.7 62.8 58.5 52.9 N/A 48.7 48.7   Fat Mass (lbs) 281 255.5 239.5 203  178 183   Fat Free Mass (lbs) 164 170.0 156.5 155.5  152 146.5   Total Body Water (lbs) 120 124.5 114.5 114  111.5 107    Preferred Learning Style:   No preference indicated   Learning Readiness:   Ready  24-hr recall: B (AM): 2 eggs OR 2 oz [place] sausage (14g) Snk (AM): L (PM): salad with cheese, ham, and [place] (21g) Snk (PM): cheese or 10-12 peanuts or cashews (5-7g) D (PM): 3 oz fish with broccoli, green beans, or a salad (21g) Snk (PM): none, sometimes deli meat or cheese  Fluid intake: water with flavoring, sugar free lemonade (80 oz total per patient report) Estimated total protein intake: 60+ g   Medications: see list  Supplementation:  taking  Using straws: No Drinking while eating: every once in a while  Hair loss: yes, improving and having some new growth Carbonated beverages: No N/V/D/C: some nausea with foods that have a sweet sauce Dumping syndrome: No  Recent physical activity:  Water aerobics 3x a week  Progress Towards Goal(s):  In progress.   Nutritional Diagnosis:  [place]-3.3 Overweight/obesity related to past poor dietary habits and physical inactivity  as evidenced by patient w/ recent RYGB surgery following dietary guidelines for continued weight loss.    Intervention:  Nutrition education provided.  Teaching Method Utilized:  Visual Auditory Hands on  Barriers to learning/adherence to lifestyle change: none  Demonstrated degree of understanding via:  Teach Back   Monitoring/Evaluation:  Dietary intake, exercise, and body weight. Follow up in 3 months for 17 month post-op visit.

## 2016-05-26 ENCOUNTER — Ambulatory Visit: Payer: Federal, State, Local not specified - PPO | Admitting: Dietician

## 2016-10-07 DIAGNOSIS — K080 Exfoliation of teeth due to systemic causes: Secondary | ICD-10-CM | POA: Diagnosis not present

## 2016-10-18 DIAGNOSIS — K080 Exfoliation of teeth due to systemic causes: Secondary | ICD-10-CM | POA: Diagnosis not present

## 2016-12-23 DIAGNOSIS — F321 Major depressive disorder, single episode, moderate: Secondary | ICD-10-CM | POA: Diagnosis not present

## 2017-02-01 DIAGNOSIS — K080 Exfoliation of teeth due to systemic causes: Secondary | ICD-10-CM | POA: Diagnosis not present

## 2017-05-29 DIAGNOSIS — F321 Major depressive disorder, single episode, moderate: Secondary | ICD-10-CM | POA: Diagnosis not present

## 2017-07-20 DIAGNOSIS — Z1231 Encounter for screening mammogram for malignant neoplasm of breast: Secondary | ICD-10-CM | POA: Diagnosis not present

## 2017-07-20 DIAGNOSIS — Z01419 Encounter for gynecological examination (general) (routine) without abnormal findings: Secondary | ICD-10-CM | POA: Diagnosis not present

## 2017-07-20 DIAGNOSIS — R011 Cardiac murmur, unspecified: Secondary | ICD-10-CM | POA: Diagnosis not present

## 2017-07-20 DIAGNOSIS — Z6841 Body Mass Index (BMI) 40.0 and over, adult: Secondary | ICD-10-CM | POA: Diagnosis not present

## 2017-07-20 DIAGNOSIS — Z3040 Encounter for surveillance of contraceptives, unspecified: Secondary | ICD-10-CM | POA: Diagnosis not present

## 2017-07-25 ENCOUNTER — Telehealth: Payer: Self-pay | Admitting: Cardiovascular Disease

## 2017-07-25 NOTE — Telephone Encounter (Signed)
Received notes from Dr Dois Davenport Rivard for appointment on 08/15/17 with Dr Duke Salvia.  Records put with Dr Leonides Sake schedule for 08/15/17. lp

## 2017-08-09 DIAGNOSIS — K080 Exfoliation of teeth due to systemic causes: Secondary | ICD-10-CM | POA: Diagnosis not present

## 2017-08-15 ENCOUNTER — Ambulatory Visit (INDEPENDENT_AMBULATORY_CARE_PROVIDER_SITE_OTHER): Payer: Federal, State, Local not specified - PPO | Admitting: Cardiovascular Disease

## 2017-08-15 ENCOUNTER — Encounter: Payer: Self-pay | Admitting: Cardiovascular Disease

## 2017-08-15 VITALS — BP 135/85 | HR 56 | Ht 69.0 in | Wt 390.6 lb

## 2017-08-15 DIAGNOSIS — R011 Cardiac murmur, unspecified: Secondary | ICD-10-CM | POA: Diagnosis not present

## 2017-08-15 DIAGNOSIS — I10 Essential (primary) hypertension: Secondary | ICD-10-CM

## 2017-08-15 HISTORY — DX: Cardiac murmur, unspecified: R01.1

## 2017-08-15 MED ORDER — CARVEDILOL 12.5 MG PO TABS: 12.5000 mg | ORAL_TABLET | Freq: Two times a day (BID) | ORAL | 5 refills | Status: DC

## 2017-08-15 NOTE — Progress Notes (Signed)
Cardiology Office Note   Date:  08/15/2017   ID:  UMEKA WRENCH, DOB [birth year], MRN [id number]  PCP:  Juluis Rainier, MD  Cardiologist:   Chilton Si, MD   No chief complaint on file.     History of Present Illness: [name] is a 51 y.o. female with hypertension and morbid obesity who presents for an evaluation of a murmur.  [name] went for her yearly exam with [name] 06/2017 and was noted to have a heart murmur.  She has seen [name] for years and this was a new finding. [name] has been feeling otherwise well. She denies any chest pain or shortness of breath. She also has not noted any lower extremity edema, orthopnea, or PND. She denies palpitations, lightheadedness, or dizziness. She does water aerobics 3 days per week and has no exertional symptoms. She reports that she's been taking metoprolol for 4 or 5 years. Her blood pressure is typically in the 130s over 80s. She had gastric bypass surgery 2 years ago and has lost over 130 pounds. She continues to try and lose weight.  She does note that both of her parents have heart murmurs that were found when they were older.   Past Medical History:  Diagnosis Date  . Anemia   . Anxiety   . Arthritis    knees   . Depression   . Headache    hx of migraines   . History of hiatal hernia   . Hypertension   . Morbid obesity with BMI of 60.0-69.9, adult (HCC)    BMI 62  . Pneumonia 07/18/2011    Past Surgical History:  Procedure Laterality Date  . APPENDECTOMY    . CESAREAN SECTION    . CHOLECYSTECTOMY    . LAPAROSCOPIC ROUX-EN-Y GASTRIC BYPASS WITH HIATAL HERNIA REPAIR N/A 12/30/2014   Procedure: LAPAROSCOPIC ROUX-EN-Y GASTRIC BYPASS WITH HIATAL HERNIA REPAIR AND UPPER ENDOSCOPY;  Surgeon: [name], MD;  Location: [facility];  Service: General;  Laterality: N/A;  . TONSILLECTOMY       Current Outpatient Prescriptions  Medication Sig Dispense Refill  . Cholecalciferol (VITAMIN D3) 5000 UNITS TABS  Take 1 tablet by mouth daily.    . diphenhydramine-acetaminophen (TYLENOL PM) 25-500 MG TABS Take 1 tablet by mouth at bedtime as needed.    Marland Kitchen FLUoxetine (PROZAC) 20 MG capsule Take 60 mg by mouth every morning.     . Influenza Virus Vaccine Split (AFLURIA) SUSP Inject 45 mcg into the muscle as directed.    . metoprolol succinate (TOPROL-XL) 100 MG 24 hr tablet Take 200 mg by mouth every morning. Take with or immediately following a meal.    . Multiple Vitamin (MULTIVITAMIN WITH MINERALS) TABS tablet Take 1 tablet by mouth daily.    . VESTURA 3-0.02 MG tablet Take 1 tablet by mouth daily.     No current facility-administered medications for this visit.     Allergies:   Darvocet [propoxyphene n-acetaminophen] and Penicillins    Social History:  The patient  reports that she has never smoked. She has never used smokeless tobacco. She reports that she does not drink alcohol or use drugs.   Family History:  The patient's family history includes Breast cancer in her maternal grandmother; Cancer in her father and paternal grandmother; Colon cancer in her father; Diabetes in her father, paternal aunt, and paternal uncle; Heart attack in her maternal grandfather; Heart disease in her maternal grandfather; Hypertension in her brother,  father, and mother; Mental illness in her paternal aunt; Stroke in her paternal aunt.    ROS:  Please see the history of present illness.   Otherwise, review of systems are positive for none.   All other systems are reviewed and negative.    PHYSICAL EXAM: VS:  BP 135/85 (BP Location: Left Arm, Cuff Size: Large)   Pulse (!) 56   Ht  (1.753 m)   Wt (!) 177.2 kg (390 lb 9.6 oz)   BMI 57.68 kg/m  , BMI Body mass index is 57.68 kg/m. GENERAL:  Well appearing HEENT:  Pupils equal round and reactive, fundi not visualized, oral mucosa unremarkable NECK:  No jugular venous distention, waveform within normal limits, carotid upstroke brisk and symmetric, no bruits, no  thyromegaly LYMPHATICS:  No cervical adenopathy LUNGS:  Clear to auscultation bilaterally HEART:  RRR.  PMI not displaced or sustained,S1 and S2 within normal limits, no S3, no S4, no clicks, no rubs, II/VI systolic murmur at the LUSB.  Increases with handgrip.  Minimal change with Valsalva.  ABD:  Flat, positive bowel sounds normal in frequency in pitch, no bruits, no rebound, no guarding, no midline pulsatile mass, no hepatomegaly, no splenomegaly EXT:  2 plus pulses throughout, no edema, no cyanosis no clubbing SKIN:  No rashes no nodules NEURO:  Cranial nerves II through XII grossly intact, motor grossly intact throughout PSYCH:  Cognitively intact, oriented to person place and time  EKG:  EKG is ordered today. The ekg ordered today demonstrates sinus bradycardia. Rate 55 bpm. Without   Recent Labs: No results found for requested labs within last 8760 hours.    Lipid Panel    Component Value Date/Time   CHOL 130 08/25/2014 0836   TRIG 149 08/25/2014 0836   HDL 46 08/25/2014 0836   CHOLHDL 2.8 08/25/2014 0836   VLDL 30 08/25/2014 0836   LDLCALC 54 08/25/2014 0836      Wt Readings from Last 3 Encounters:  08/15/17 (!) 177.2 kg (390 lb 9.6 oz)  02/24/16 (!) 149.5 kg (329 lb 8 oz)  11/26/15 (!) 149.7 kg (330 lb)      ASSESSMENT AND PLAN:  # Murmur: [name] has a systolic murmur on echo.  This could be either an outflow tract gradient or AS.  We will get an echo.  She is asymptomatic.  # Hypertension: BP above goal here and at home.  She is also bradycardic.  We will switch metoprolol to carvedilol 12.5mg  bid.  If BP remains >130/80, increase to .     Current medicines are reviewed at length with the patient today.  The patient does not have concerns regarding medicines.  The following changes have been made:  no change  Labs/ tests ordered today include:  No orders of the defined types were placed in this encounter.    Disposition:   FU with Noriah Osgood C.  Duke Salvia, MD, [facility] in 1 month.     This note was written with the assistance of speech recognition software.  Please excuse any transcriptional errors.  Signed, Tiwanna Tuch C. Duke Salvia, MD, [facility]  08/15/2017 9:18 AM    [facility]

## 2017-08-15 NOTE — Patient Instructions (Signed)
Medication Instructions:  STOP METOPROLOL   START CARVEDILOL 12.5 MG TWICE A DAY   Labwork: NONE  Testing/Procedures: Your physician has requested that you have an echocardiogram. Echocardiography is a painless test that uses sound waves to create images of your heart. It provides your doctor with information about the size and shape of your heart and how well your heart's chambers and valves are working. This procedure takes approximately one hour. There are no restrictions for this procedure. [facility] AT [address]   Follow-Up: Your physician recommends that you schedule a follow-up appointment in: 4-6 WEEKS   Any Other Special Instructions Will Be Listed Below (If Applicable). MONITOR YOUR BLOOD PRESSURE AT HOME AND IF DOES NOT GET TO GOAL YOU MAY INCREASE CARVEDILOL TO 25 MG TWICE A DAY   If you need a refill on your cardiac medications before your next appointment, please call your pharmacy.

## 2017-08-22 ENCOUNTER — Other Ambulatory Visit: Payer: Self-pay

## 2017-08-22 ENCOUNTER — Ambulatory Visit (HOSPITAL_COMMUNITY): Payer: Federal, State, Local not specified - PPO | Attending: Cardiology

## 2017-08-22 DIAGNOSIS — Z6841 Body Mass Index (BMI) 40.0 and over, adult: Secondary | ICD-10-CM | POA: Diagnosis not present

## 2017-08-22 DIAGNOSIS — R011 Cardiac murmur, unspecified: Secondary | ICD-10-CM

## 2017-08-22 DIAGNOSIS — I10 Essential (primary) hypertension: Secondary | ICD-10-CM | POA: Diagnosis not present

## 2017-08-22 DIAGNOSIS — D649 Anemia, unspecified: Secondary | ICD-10-CM | POA: Insufficient documentation

## 2017-08-30 DIAGNOSIS — K080 Exfoliation of teeth due to systemic causes: Secondary | ICD-10-CM | POA: Diagnosis not present

## 2017-09-05 ENCOUNTER — Telehealth: Payer: Self-pay | Admitting: Cardiovascular Disease

## 2017-09-05 MED ORDER — CARVEDILOL 25 MG PO TABS: 25.0000 mg | ORAL_TABLET | Freq: Two times a day (BID) | ORAL | 5 refills | Status: DC

## 2017-09-05 NOTE — Telephone Encounter (Signed)
Advised patient, verbalized understanding.  Will call back if no improvement  

## 2017-09-05 NOTE — Telephone Encounter (Signed)
Increase to  bid.

## 2017-09-05 NOTE — Telephone Encounter (Signed)
Pt states that she is concerned about her BP since starting the carvedilol 2 weeks ago and stopping the methyldopa. She denies any symptoms chest pain or pressure, Dizziness, headache, blurred vision, passed out. Here are her BP 156/83 on 10/8,  10-5 146/73, 10-6 157/91, 10-7 177/97 she states that she did not write down the HR. She states that her BP was better on her other medication, can the carvedilol not be working yet? She is taking her other medications listed.

## 2017-09-05 NOTE — Telephone Encounter (Signed)
New message    Patient concerned about  BP.  Wants to know if medication should be increased. Please call  Pt c/o BP issue: STAT if pt c/o blurred vision, one-sided weakness or slurred speech  1. What are your last 5 BP readings? 156/83 on 10/8, 146/73, 157/91, 177/97  2. Are you having any other symptoms (ex. Dizziness, headache, blurred vision, passed out)? NO  3. What is your BP issue?  Wants to know if medication should be increased.  Pt c/o medication issue:  1. Name of Medication: carvedilol (COREG) 12.5 MG tablet  2. How are you currently taking this medication (dosage and times per day)? Take 1 tablet (12.5 mg total) by mouth 2 (two) times daily.  3. Are you having a reaction (difficulty breathing--STAT)? NO  4. What is your medication issue? Wants to know if  Medication should be changed

## 2017-09-25 DIAGNOSIS — M170 Bilateral primary osteoarthritis of knee: Secondary | ICD-10-CM | POA: Diagnosis not present

## 2017-09-28 ENCOUNTER — Ambulatory Visit (INDEPENDENT_AMBULATORY_CARE_PROVIDER_SITE_OTHER): Payer: Federal, State, Local not specified - PPO | Admitting: Cardiovascular Disease

## 2017-09-28 ENCOUNTER — Encounter: Payer: Self-pay | Admitting: Cardiovascular Disease

## 2017-09-28 VITALS — BP 146/85 | HR 61 | Ht 69.0 in | Wt 394.2 lb

## 2017-09-28 DIAGNOSIS — Z5181 Encounter for therapeutic drug level monitoring: Secondary | ICD-10-CM | POA: Diagnosis not present

## 2017-09-28 DIAGNOSIS — I10 Essential (primary) hypertension: Secondary | ICD-10-CM | POA: Diagnosis not present

## 2017-09-28 MED ORDER — TRIAMTERENE-HCTZ 37.5-25 MG PO TABS
ORAL_TABLET | ORAL | 5 refills | Status: DC
Start: 2017-09-28 — End: 2018-03-18

## 2017-09-28 NOTE — Progress Notes (Signed)
Cardiology Office Note   Date:  09/28/2017   ID:  [name], DOB 17-Apr-1966, MRN [id number]  PCP:  Juluis Rainier, MD  Cardiologist:   Chilton Si, MD   No chief complaint on file.    History of Present Illness: [name] is a 51 y.o. female with hypertension and morbid obesity who presents for an evaluation of a murmur.  [name] went for her yearly exam with [name] 06/2017 and was noted to have a heart murmur.  She has seen [name] for years and this was a new finding. [name] has been feeling otherwise well. She denies any chest pain or shortness of breath. She also has not noted any lower extremity edema, orthopnea, or PND. She denies palpitations, lightheadedness, or dizziness. She does water aerobics 3 days per week and has no exertional symptoms. She reports that she's been taking metoprolol for 4 or 5 years. Her blood pressure is typically in the 130s over 80s. She had gastric bypass surgery 2 years ago and has lost over 130 pounds. She continues to try and lose weight.  She does note that both of her parents have heart murmurs that were found when they were older.  At her last appointment she was switched from metoprolol to carvedilol.  Her blood pressure has remained elevated so it was increased to 25 bid.  Since that time her blood pressure has remained elevated.  It is been typically in the 150s.  However several days ago she had steroid injections in her knees.  For the last 3 days her blood pressure has been in the 160s-180s.  She also notes that she has been retaining fluid.  In the time.  When her blood pressure was elevated she also experienced headaches.  She denies vision changes, chest pain, or shortness of breath.  Since having her injections her knees are feeling much better.  She had an echo 08/22/17 that revealed LVEF 65-70% with grade 1 diastolic dysfunction, mildly calcified aortic valve gradients.     Past Medical History:  Diagnosis Date   . Anemia   . Anxiety   . Arthritis    knees   . Depression   . Headache    hx of migraines   . History of hiatal hernia   . Hypertension   . Morbid obesity with BMI of 60.0-69.9, adult (HCC)    BMI 62  . Murmur 08/15/2017  . Pneumonia 07/18/2011    Past Surgical History:  Procedure Laterality Date  . APPENDECTOMY    . CESAREAN SECTION    . CHOLECYSTECTOMY    . LAPAROSCOPIC ROUX-EN-Y GASTRIC BYPASS WITH HIATAL HERNIA REPAIR N/A 12/30/2014   Procedure: LAPAROSCOPIC ROUX-EN-Y GASTRIC BYPASS WITH HIATAL HERNIA REPAIR AND UPPER ENDOSCOPY;  Surgeon: [name], MD;  Location: [facility];  Service: General;  Laterality: N/A;  . TONSILLECTOMY       Current Outpatient Prescriptions  Medication Sig Dispense Refill  . carvedilol (COREG) 25 MG tablet Take 2 tablets by mouth 2 (two) times daily.  5  . Cholecalciferol (VITAMIN D3) 5000 UNITS TABS Take 1 tablet by mouth daily.    . diphenhydramine-acetaminophen (TYLENOL PM) 25-500 MG TABS Take 1 tablet by mouth at bedtime as needed.    Marland Kitchen FLUoxetine (PROZAC) 20 MG capsule Take 60 mg by mouth every morning.     . Influenza Virus Vaccine Split (AFLURIA) SUSP Inject 45 mcg into the muscle as directed.    . Multiple  Vitamin (MULTIVITAMIN WITH MINERALS) TABS tablet Take 1 tablet by mouth daily.    . VESTURA 3-0.02 MG tablet Take 1 tablet by mouth daily.    Marland Kitchen. triamterene-hydrochlorothiazide (MAXZIDE-25) 37.5-25 MG tablet 1/2 TABLET BY MOUTH DAILY 15 tablet 5   No current facility-administered medications for this visit.     Allergies:   Darvocet [propoxyphene n-acetaminophen] and Penicillins    Social History:  The patient  reports that she has never smoked. She has never used smokeless tobacco. She reports that she does not drink alcohol or use drugs.   Family History:  The patient's family history includes Breast cancer in her maternal grandmother; Cancer in her father and paternal grandmother; Colon cancer in her father; Diabetes in her  father, paternal aunt, and paternal uncle; Heart attack in her maternal grandfather; Heart disease in her maternal grandfather; Hypertension in her brother, father, and mother; Mental illness in her paternal aunt; Stroke in her paternal aunt.    ROS:  Please see the history of present illness.   Otherwise, review of systems are positive for none.   All other systems are reviewed and negative.    PHYSICAL EXAM:   EKG:  EKG is ordered today. The ekg ordered today demonstrates sinus bradycardia. Rate 55 bpm. Without  Echo 08/22/17:  Study Conclusions  - Left ventricle: The cavity size was normal. There was moderate   concentric hypertrophy. Systolic function was vigorous. The   estimated ejection fraction was in the range of 65% to 70%. Wall   motion was normal; there were no regional wall motion   abnormalities. Doppler parameters are consistent with abnormal   left ventricular relaxation (grade 1 diastolic dysfunction). - Aortic valve: Trileaflet; mildly thickened, mildly calcified   leaflets. - Mitral valve: Moderately calcified annulus. There was trivial   regurgitation. - Left atrium: The atrium was moderately dilated.   Recent Labs: No results found for requested labs within last 8760 hours.    Lipid Panel    Component Value Date/Time   CHOL 130 08/25/2014 0836   TRIG 149 08/25/2014 0836   HDL 46 08/25/2014 0836   CHOLHDL 2.8 08/25/2014 0836   VLDL 30 08/25/2014 0836   LDLCALC 54 08/25/2014 0836      Wt Readings from Last 3 Encounters:  09/28/17 (!) 178.8 kg (394 lb 3.2 oz)  08/15/17 (!) 177.2 kg (390 lb 9.6 oz)  02/24/16 (!) 149.5 kg (329 lb 8 oz)      ASSESSMENT AND PLAN:  # Hypertension: BP remains elevated.  Continue carvedilol and add HCTZ/triameterene 12.5mg .  This should also help with her edema.  If her blood pressure remains elevated she can increase this to 25 mg.  She will return for a BMP in one week.   # Murmur: Aortic valve sclerosis but not  stenosis.   Current medicines are reviewed at length with the patient today.  The patient does not have concerns regarding medicines.  The following changes have been made:  no change  Labs/ tests ordered today include:   Orders Placed This Encounter  Procedures  . Basic metabolic panel     Disposition:   FU with Alynn Ellithorpe C. Duke Salviaandolph, MD, [facility] in 4 months.  PharmD in 1 month.    This note was written with the assistance of speech recognition software.  Please excuse any transcriptional errors.  Signed, Staysha Truby C. Duke Salviaandolph, MD, [name]  09/28/2017 1:55 PM    [facility]

## 2017-09-28 NOTE — Patient Instructions (Signed)
Medication Instructions:  START HYDROCHLOROTHIAZIDE-TRIAMTERENE 25-37.5 MG 1/2 TABLET DAILY   Labwork: BMET IN 1 WEEK   Testing/Procedures: NONE  Follow-Up: Your physician recommends that you schedule a follow-up appointment in: 1 MONTH WITH PHARM D FOR BLOOD PRESSURE  Your physician recommends that you schedule a follow-up appointment in: [facility]   If you need a refill on your cardiac medications before your next appointment, please call your pharmacy.

## 2017-10-03 ENCOUNTER — Ambulatory Visit: Payer: Federal, State, Local not specified - PPO | Admitting: Cardiovascular Disease

## 2017-10-06 DIAGNOSIS — Z5181 Encounter for therapeutic drug level monitoring: Secondary | ICD-10-CM | POA: Diagnosis not present

## 2017-10-07 LAB — BASIC METABOLIC PANEL
BUN/Creatinine Ratio: 29 — ABNORMAL HIGH (ref 9–23)
BUN: 15 mg/dL (ref 6–24)
CO2: 22 mmol/L (ref 20–29)
Calcium: 9 mg/dL (ref 8.7–10.2)
Chloride: 104 mmol/L (ref 96–106)
Creatinine, Ser: 0.52 mg/dL — ABNORMAL LOW (ref 0.57–1.00)
GFR, EST AFRICAN AMERICAN: 127 mL/min/{1.73_m2} (ref >59)
GFR, EST NON AFRICAN AMERICAN: 110 mL/min/{1.73_m2} (ref >59)
Glucose: 153 mg/dL — ABNORMAL HIGH (ref 65–99)
POTASSIUM: 4.1 mmol/L (ref 3.5–5.2)
Sodium: 143 mmol/L (ref 134–144)

## 2017-10-25 NOTE — Progress Notes (Signed)
Patient ID: [name]                 DOB: 09/29/1966                      MRN: [id number]     HPI: [name] is a 51 y.o. female referred by Dr. Duke Salviaandolph to HTN clinic. PMH includes hypertension, morbid obesity,  Migraine headaches, and depression.  Patient had gastric bypass surgery 2 years ago and lost ~130lbs since. Presents to HTN clinic today and denies problems with current therapy.  Denies swelling, increase fatigue, dizziness, chest pain or headaches. She will like to know why her metoprolol was changed to carvedilol. Her perception is that metoprolol was doing a better job at controlling her BP.   Current HTN meds:  Trianterene-HCTZ 37.5mg /25mg  (1/2 tablet) daily Carvedilol 50mg  twice daily  Previously tried: Metoprolol    BP goal: <130/80  Family History: Breast cancer in her maternal grandmother; Cancer in her father and paternal grandmother; Colon cancer in her father; Diabetes in her father, Heart attack in her maternal grandfather; Heart disease in her maternal grandfather; Hypertension in her brother, father, and mother; Mental illness in her paternal aunt; Stroke in her paternal aunt.   Social History: The patient  reports that she has never smoked. She has never used smokeless tobacco. She reports that she does not drink alcohol or use drugs.   Diet: low sodium, low carb diet, avoid adding salt to food, no canned vegetables, no sodas, decaf tea  Exercise: water aerobics 3x/weeks  Home BP readings:  Only 3 readings available 149/67, 148/65 and 150/68 (average 149/66)  Wt Readings from Last 3 Encounters:  09/28/17 (!) 394 lb 3.2 oz (178.8 kg)  08/15/17 (!) 390 lb 9.6 oz (177.2 kg)  02/24/16 (!) 329 lb 8 oz (149.5 kg)   BP Readings from Last 3 Encounters:  10/26/17 130/62  09/28/17 (!) 146/85  08/15/17 135/85   Pulse Readings from Last 3 Encounters:  10/26/17 66  09/28/17 61  08/15/17 (!) 56    Past Medical History:  Diagnosis Date  . Anemia   .  Anxiety   . Arthritis    knees   . Depression   . Headache    hx of migraines   . History of hiatal hernia   . Hypertension   . Morbid obesity with BMI of 60.0-69.9, adult (HCC)    BMI 62  . Murmur 08/15/2017  . Pneumonia 07/18/2011    Current Outpatient Medications on File Prior to Visit  Medication Sig Dispense Refill  . carvedilol (COREG) 25 MG tablet Take 2 tablets by mouth 2 (two) times daily.  5  . Cholecalciferol (VITAMIN D3) 5000 UNITS TABS Take 1 tablet by mouth daily.    . diphenhydramine-acetaminophen (TYLENOL PM) 25-500 MG TABS Take 1 tablet by mouth at bedtime as needed.    Marland Kitchen. FLUoxetine (PROZAC) 20 MG capsule Take 60 mg by mouth every morning.     . Multiple Vitamin (MULTIVITAMIN WITH MINERALS) TABS tablet Take 1 tablet by mouth daily.    Marland Kitchen. triamterene-hydrochlorothiazide (MAXZIDE-25) 37.5-25 MG tablet 1/2 TABLET BY MOUTH DAILY 15 tablet 5  . VESTURA 3-0.02 MG tablet Take 1 tablet by mouth daily.    . Influenza Virus Vaccine Split (AFLURIA) SUSP Inject 45 mcg into the muscle as directed.     No current facility-administered medications on file prior to visit.     Allergies  Allergen Reactions  .  Darvocet [Propoxyphene N-Acetaminophen] Rash  . Penicillins Swelling    Blood pressure 130/62, pulse 66, SpO2 97 %.  HTN (hypertension) Blood pressure and heart rate are within desired goal and appropriate for current regimen. We spent long time covering differences between metoprolol and carvedilol and reasons to keep her HR (pulse) above 50s. Noted 3 home BP readings available remains above goal, therefore, will continue current medication as prescribed, start BP monitoring twice daily and bring records to f/u visit in 4 weeks. Patient may be experiencing elevated BP prior to taking morning mediation or may have masked hypertension.  Plan to increase Maxide to 1 tablet daily if additional BP control neeeded during next office visit.    Kais Monje Rodriguez-Guzman PharmD,  BCPS, CPP [facility] [address] [place] [phone] 10/26/2017 10:43 AM

## 2017-10-26 ENCOUNTER — Encounter: Payer: Self-pay | Admitting: Pharmacist

## 2017-10-26 ENCOUNTER — Ambulatory Visit (INDEPENDENT_AMBULATORY_CARE_PROVIDER_SITE_OTHER): Payer: Federal, State, Local not specified - PPO | Admitting: Pharmacist

## 2017-10-26 VITALS — BP 130/62 | HR 66

## 2017-10-26 DIAGNOSIS — I10 Essential (primary) hypertension: Secondary | ICD-10-CM | POA: Diagnosis not present

## 2017-10-26 NOTE — Assessment & Plan Note (Addendum)
Blood pressure and heart rate are within desired goal and appropriate for current regimen. We spent long time covering differences between metoprolol and carvedilol and reasons to keep her HR (pulse) above 50s. Noted 3 home BP readings available remains above goal, therefore, will continue current medication as prescribed, start BP monitoring twice daily and bring records to f/u visit in 4 weeks. Patient may be experiencing elevated BP prior to taking morning mediation or may have masked hypertension.  Plan to increase Maxide to 1 tablet daily if additional BP control neeeded during next office visit.

## 2017-10-26 NOTE — Patient Instructions (Addendum)
Return for a a follow up appointment in 4 weeks  Your blood pressure today is 130/62 pulse 66  Check your blood pressure at home daily (if able) and keep record of the readings.  Take your BP meds as follows: *Continue all medication as previously prescribed*  Bring your BP cuff and your record of home blood pressures to your next appointment.  Exercise as you're able, try to walk approximately 30 minutes per day.  Keep salt intake to a minimum, especially watch canned and prepared boxed foods.  Eat more fresh fruits and vegetables and fewer canned items.  Avoid eating in fast food restaurants.    HOW TO TAKE YOUR BLOOD PRESSURE: . Rest 5 minutes before taking your blood pressure. .  Don't smoke or drink caffeinated beverages for at least 30 minutes before. . Take your blood pressure before (not after) you eat. . Sit comfortably with your back supported and both feet on the floor (don't cross your legs). . Elevate your arm to heart level on a table or a desk. . Use the proper sized cuff. It should fit smoothly and snugly around your bare upper arm. There should be enough room to slip a fingertip under the cuff. The bottom edge of the cuff should be 1 inch above the crease of the elbow. . Ideally, take 3 measurements at one sitting and record the average.

## 2017-11-29 NOTE — Progress Notes (Signed)
Patient ID: CLYDINE PARKISON                 DOB: September 30, 1966                      MRN: [id number]     HPI: [name] is a 52 y.o. female referred by Dr. Duke Salvia to HTN clinic. PMH includes hypertension, morbid obesity, migraine headaches, and depression.  Patient had gastric bypass surgery 2 years ago and lost ~130lbs since. Previously had metoprolol switched to carvedilol for better BP control.   At her last visit her pressure in the office was well controlled at 130/62, however her reported home readings (3) averaged 149/66.    She was asked to return after another month with regular home readings to determine if there is a masked hypertension.  Today she reports feeling well, glad that December is over, as her husband is a Paramedic and she didn't see much of him in December.  She is feeling well overall, no complaints of chest pain, dizziness or orthostatic concerns.    Current HTN meds:  Trianterene-HCTZ 37.5mg /25mg  (1/2 tablet) daily - am Carvedilol 50mg  twice daily  Previously tried: Metoprolol    BP goal: <130/80  Family History: Breast cancer in her maternal grandmother; Cancer in her father and paternal grandmother; Colon cancer in her father; Diabetes in her father, Heart attack in her maternal grandfather; Heart disease in her maternal grandfather; Hypertension in her brother, father, and mother; Mental illness in her paternal aunt; Stroke in her paternal aunt.   Social History: The patient  reports that she has never smoked. She has never used smokeless tobacco. She reports that she does not drink alcohol or use drugs.   Diet: low sodium, low carb diet, avoid adding salt to food, no canned vegetables, no sodas or coffee, decaf tea  Admits to a few dietary changes over the holidays, but is back on normal dietary routine again.  Exercise: water aerobics 3x/weeks  Home BP readings:  Home cuff 3-4 months old, bought around time of first visit to Dr. Duke Salvia.  [employer]  brand  Cuff is too small for patient arm and read 24/8 points higher than office cuff.    Home average for mornings (12 readings) is 152/86, if adjusted for difference would be 128/78.    Wt Readings from Last 3 Encounters:  09/28/17 (!) 394 lb 3.2 oz (178.8 kg)  08/15/17 (!) 390 lb 9.6 oz (177.2 kg)  02/24/16 (!) 329 lb 8 oz (149.5 kg)   BP Readings from Last 3 Encounters:  11/30/17 120/62  10/26/17 130/62  09/28/17 (!) 146/85   Pulse Readings from Last 3 Encounters:  11/30/17 64  10/26/17 66  09/28/17 61    Past Medical History:  Diagnosis Date  . Anemia   . Anxiety   . Arthritis    knees   . Depression   . Headache    hx of migraines   . History of hiatal hernia   . Hypertension   . Morbid obesity with BMI of 60.0-69.9, adult (HCC)    BMI 62  . Murmur 08/15/2017  . Pneumonia 07/18/2011    Current Outpatient Medications on File Prior to Visit  Medication Sig Dispense Refill  . carvedilol (COREG) 25 MG tablet Take 2 tablets by mouth 2 (two) times daily.  5  . Cholecalciferol (VITAMIN D3) 5000 UNITS TABS Take 1 tablet by mouth daily.    . diphenhydramine-acetaminophen (TYLENOL  PM) 25-500 MG TABS Take 1 tablet by mouth at bedtime as needed.    Marland Kitchen. FLUoxetine (PROZAC) 20 MG capsule Take 60 mg by mouth every morning.     . Influenza Virus Vaccine Split (AFLURIA) SUSP Inject 45 mcg into the muscle as directed.    . Multiple Vitamin (MULTIVITAMIN WITH MINERALS) TABS tablet Take 1 tablet by mouth daily.    Marland Kitchen. triamterene-hydrochlorothiazide (MAXZIDE-25) 37.5-25 MG tablet 1/2 TABLET BY MOUTH DAILY 15 tablet 5  . VESTURA 3-0.02 MG tablet Take 1 tablet by mouth daily.     No current facility-administered medications on file prior to visit.     Allergies  Allergen Reactions  . Darvocet [Propoxyphene N-Acetaminophen] Rash  . Penicillins Swelling    Blood pressure 120/62, pulse 64.  HTN (hypertension) Patient with essential hypertension, appears to be well controlled at  this time.  Her home readings are significantly elevated, however this is mostly likely due to undersized cuff, as her last office readings have been WNL.  She is to continue with her current medications and will look into buying an extra large cuff for her device.  If she is able to purchase one, I advised that she call and we can arrange for a quick check to be sure that it is accurate.  Otherwise, she will see Dr. Duke Salviaandolph in March and we would be happy to see her again in the future should she need continued monitoring.    Phillips HayKristin Alvstad PharmD CPP [facility] [address] [place] [phone] 11/30/2017 9:38 AM

## 2017-11-30 ENCOUNTER — Encounter: Payer: Self-pay | Admitting: Pharmacist Clinician (PhC)/ Clinical Pharmacy Specialist

## 2017-11-30 ENCOUNTER — Ambulatory Visit (INDEPENDENT_AMBULATORY_CARE_PROVIDER_SITE_OTHER): Payer: Federal, State, Local not specified - PPO | Admitting: Pharmacist Clinician (PhC)/ Clinical Pharmacy Specialist

## 2017-11-30 DIAGNOSIS — I10 Essential (primary) hypertension: Secondary | ICD-10-CM | POA: Diagnosis not present

## 2017-11-30 NOTE — Assessment & Plan Note (Signed)
Patient with essential hypertension, appears to be well controlled at this time.  Her home readings are significantly elevated, however this is mostly likely due to undersized cuff, as her last office readings have been WNL.  She is to continue with her current medications and will look into buying an extra large cuff for her device.  If she is able to purchase one, I advised that she call and we can arrange for a quick check to be sure that it is accurate.  Otherwise, she will see Dr. Duke Salviaandolph in March and we would be happy to see her again in the future should she need continued monitoring.

## 2017-11-30 NOTE — Patient Instructions (Signed)
  Your blood pressure today is 120/62 (goal is < 130/80)  Take your BP meds as follows:  Continue all current medications  Bring all of your meds, your BP cuff and your record of home blood pressures to your next appointment.  Exercise as you're able, try to walk approximately 30 minutes per day.  Keep salt intake to a minimum, especially watch canned and prepared boxed foods.  Eat more fresh fruits and vegetables and fewer canned items.  Avoid eating in fast food restaurants.    HOW TO TAKE YOUR BLOOD PRESSURE: . Rest 5 minutes before taking your blood pressure. .  Don't smoke or drink caffeinated beverages for at least 30 minutes before. . Take your blood pressure before (not after) you eat. . Sit comfortably with your back supported and both feet on the floor (don't cross your legs). . Elevate your arm to heart level on a table or a desk. . Use the proper sized cuff. It should fit smoothly and snugly around your bare upper arm. There should be enough room to slip a fingertip under the cuff. The bottom edge of the cuff should be 1 inch above the crease of the elbow. . Ideally, take 3 measurements at one sitting and record the average.

## 2017-12-06 DIAGNOSIS — F321 Major depressive disorder, single episode, moderate: Secondary | ICD-10-CM | POA: Diagnosis not present

## 2017-12-28 DIAGNOSIS — M170 Bilateral primary osteoarthritis of knee: Secondary | ICD-10-CM | POA: Diagnosis not present

## 2018-01-26 ENCOUNTER — Ambulatory Visit: Payer: Federal, State, Local not specified - PPO | Admitting: Cardiovascular Disease

## 2018-01-26 VITALS — BP 118/78 | HR 65 | Ht 69.0 in | Wt >= 6400 oz

## 2018-01-26 DIAGNOSIS — R011 Cardiac murmur, unspecified: Secondary | ICD-10-CM | POA: Diagnosis not present

## 2018-01-26 DIAGNOSIS — I10 Essential (primary) hypertension: Secondary | ICD-10-CM | POA: Diagnosis not present

## 2018-01-26 NOTE — Progress Notes (Signed)
Cardiology Office Note   Date:  02/09/2018   ID:  Tamara RiggsDenise B Townsend, DOB 10/14/1966, MRN [id number]  PCP:  Juluis RainierBarnes, [name], MD  Cardiologist:   Chilton Siiffany Sweetwater, MD   No chief complaint on file.    History of Present Illness: [name] is a 52 y.o. female with hypertension and morbid obesity who presents for follow up.  She was initially seen 07/2017 for an evaluation of a murmur.  [name] went for her yearly exam with Dr. Estanislado Pandyivard 06/2017 and was noted to have a heart murmur.  She has seen Dr. Estanislado Pandyivard for years and this was a new finding.  She had an echo 08/22/17 that revealed LVEF 65-70% with grade 1 diastolic dysfunction, mildly calcified aortic valve gradients.  Metoprolol was switched to carvedilol due to elevated blood pressure.  At her last appointment HCTZ-triamterene was added and then increased by our pharmacist 09/2017.    Since her last appointment [name] has been doing well.  She does water aerobics for 45 minutes 3 days/week and has no exertional symptoms.  She has not experienced any chest pain or pressure.  Her breathing has been stable.  She denies lower extremity edema, orthopnea, or PND.  She reports that her diet has been good.  Her blood pressure at home runs 20 points higher than in the office.  She is in the process of getting a larger blood pressure cuff.   Past Medical History:  Diagnosis Date  . Anemia   . Anxiety   . Arthritis    knees   . Depression   . Headache    hx of migraines   . History of hiatal hernia   . Hypertension   . Morbid obesity with BMI of 60.0-69.9, adult (HCC)    BMI 62  . Murmur 08/15/2017  . Pneumonia 07/18/2011    Past Surgical History:  Procedure Laterality Date  . APPENDECTOMY    . CESAREAN SECTION    . CHOLECYSTECTOMY    . LAPAROSCOPIC ROUX-EN-Y GASTRIC BYPASS WITH HIATAL HERNIA REPAIR N/A 12/30/2014   Procedure: LAPAROSCOPIC ROUX-EN-Y GASTRIC BYPASS WITH HIATAL HERNIA REPAIR AND UPPER ENDOSCOPY;  Surgeon: Atilano InaEric M  Wilson, MD;  Location: [facility];  Service: General;  Laterality: N/A;  . TONSILLECTOMY       Current Outpatient Medications  Medication Sig Dispense Refill  . carvedilol (COREG) 25 MG tablet Take 2 tablets by mouth 2 (two) times daily.  5  . Cholecalciferol (VITAMIN D3) 5000 UNITS TABS Take 1 tablet by mouth daily.    . diphenhydramine-acetaminophen (TYLENOL PM) 25-500 MG TABS Take 1 tablet by mouth at bedtime as needed.    Marland Kitchen. FLUoxetine (PROZAC) 20 MG capsule Take 60 mg by mouth every morning.     . Multiple Vitamin (MULTIVITAMIN WITH MINERALS) TABS tablet Take 1 tablet by mouth daily.    Marland Kitchen. triamterene-hydrochlorothiazide (MAXZIDE-25) 37.5-25 MG tablet 1/2 TABLET BY MOUTH DAILY 15 tablet 5  . VESTURA 3-0.02 MG tablet Take 1 tablet by mouth daily.     No current facility-administered medications for this visit.     Allergies:   Darvocet [propoxyphene n-acetaminophen] and Penicillins    Social History:  The patient  reports that  has never smoked. she has never used smokeless tobacco. She reports that she does not drink alcohol or use drugs.   Family History:  The patient's family history includes Breast cancer in her maternal grandmother; Cancer in her father and paternal grandmother; Colon cancer in  her father; Diabetes in her father, paternal aunt, and paternal uncle; Heart attack in her maternal grandfather; Heart disease in her maternal grandfather; Hypertension in her brother, father, and mother; Mental illness in her paternal aunt; Stroke in her paternal aunt.    ROS:  Please see the history of present illness.   Otherwise, review of systems are positive for none.   All other systems are reviewed and negative.    PHYSICAL EXAM:   EKG:  EKG is ordered today. The ekg ordered 08/15/17 demonstrates sinus bradycardia. Rate 55 bpm.  01/26/18: Sinus rhythm.  Rate 65 bpm.  Echo 08/22/17:  Study Conclusions  - Left ventricle: The cavity size was normal. There was moderate   concentric  hypertrophy. Systolic function was vigorous. The   estimated ejection fraction was in the range of 65% to 70%. Wall   motion was normal; there were no regional wall motion   abnormalities. Doppler parameters are consistent with abnormal   left ventricular relaxation (grade 1 diastolic dysfunction). - Aortic valve: Trileaflet; mildly thickened, mildly calcified   leaflets. - Mitral valve: Moderately calcified annulus. There was trivial   regurgitation. - Left atrium: The atrium was moderately dilated.   Recent Labs: 10/06/2017: BUN 15; Creatinine, Ser 0.52; Potassium 4.1; Sodium 143    Lipid Panel    Component Value Date/Time   CHOL 130 08/25/2014 0836   TRIG 149 08/25/2014 0836   HDL 46 08/25/2014 0836   CHOLHDL 2.8 08/25/2014 0836   VLDL 30 08/25/2014 0836   LDLCALC 54 08/25/2014 0836      Wt Readings from Last 3 Encounters:  01/26/18 (!) 401 lb 12.8 oz (182.3 kg)  09/28/17 (!) 394 lb 3.2 oz (178.8 kg)  08/15/17 (!) 390 lb 9.6 oz (177.2 kg)      ASSESSMENT AND PLAN:  # Hypertension: BP well-controlled on carvedilol andHCTZ-triamterene.  # Murmur: Aortic valve sclerosis but not stenosis.  She was advised of symptoms that warrant sooner follow-up including shortness of breath, edema, lightheadedness, dizziness, and chest pain.    Current medicines are reviewed at length with the patient today.  The patient does not have concerns regarding medicines.  The following changes have been made:  no change  Labs/ tests ordered today include:   Orders Placed This Encounter  Procedures  . EKG 12-Lead     Disposition:   FU with Tamara Floresca C. Duke Salvia, MD, [facility] in 1 year.   Signed, Hanako Tipping C. Duke Salvia, MD, [facility]  02/09/2018 11:28 AM    [facility]

## 2018-01-26 NOTE — Patient Instructions (Signed)

## 2018-02-06 DIAGNOSIS — K080 Exfoliation of teeth due to systemic causes: Secondary | ICD-10-CM | POA: Diagnosis not present

## 2018-02-09 ENCOUNTER — Encounter: Payer: Self-pay | Admitting: Cardiovascular Disease

## 2018-03-18 ENCOUNTER — Other Ambulatory Visit: Payer: Self-pay | Admitting: Cardiovascular Disease

## 2018-03-19 NOTE — Telephone Encounter (Signed)
Refill Request.  

## 2018-03-27 DIAGNOSIS — M170 Bilateral primary osteoarthritis of knee: Secondary | ICD-10-CM | POA: Diagnosis not present

## 2018-04-03 ENCOUNTER — Other Ambulatory Visit: Payer: Self-pay | Admitting: Cardiovascular Disease

## 2018-05-23 DIAGNOSIS — F321 Major depressive disorder, single episode, moderate: Secondary | ICD-10-CM | POA: Diagnosis not present

## 2018-06-28 DIAGNOSIS — M170 Bilateral primary osteoarthritis of knee: Secondary | ICD-10-CM | POA: Diagnosis not present

## 2018-07-23 DIAGNOSIS — Z1231 Encounter for screening mammogram for malignant neoplasm of breast: Secondary | ICD-10-CM | POA: Diagnosis not present

## 2018-07-23 DIAGNOSIS — Z6841 Body Mass Index (BMI) 40.0 and over, adult: Secondary | ICD-10-CM | POA: Diagnosis not present

## 2018-07-23 DIAGNOSIS — Z01419 Encounter for gynecological examination (general) (routine) without abnormal findings: Secondary | ICD-10-CM | POA: Diagnosis not present

## 2018-07-23 DIAGNOSIS — Z3040 Encounter for surveillance of contraceptives, unspecified: Secondary | ICD-10-CM | POA: Diagnosis not present

## 2018-07-24 ENCOUNTER — Other Ambulatory Visit: Payer: Self-pay | Admitting: Cardiovascular Disease

## 2018-07-24 NOTE — Telephone Encounter (Signed)
Rx sent to pharmacy   

## 2018-08-15 DIAGNOSIS — K080 Exfoliation of teeth due to systemic causes: Secondary | ICD-10-CM | POA: Diagnosis not present

## 2018-08-20 ENCOUNTER — Other Ambulatory Visit: Payer: Self-pay | Admitting: Cardiovascular Disease

## 2018-09-17 ENCOUNTER — Other Ambulatory Visit: Payer: Self-pay | Admitting: Cardiovascular Disease

## 2018-09-17 NOTE — Telephone Encounter (Signed)
Rx request sent to pharmacy.  

## 2018-09-27 DIAGNOSIS — M170 Bilateral primary osteoarthritis of knee: Secondary | ICD-10-CM | POA: Diagnosis not present

## 2018-10-14 ENCOUNTER — Other Ambulatory Visit: Payer: Self-pay | Admitting: Cardiovascular Disease

## 2018-10-24 ENCOUNTER — Encounter: Payer: Self-pay | Admitting: Emergency Medicine

## 2018-10-24 DIAGNOSIS — F429 Obsessive-compulsive disorder, unspecified: Secondary | ICD-10-CM | POA: Insufficient documentation

## 2018-10-24 DIAGNOSIS — F40243 Fear of flying: Secondary | ICD-10-CM | POA: Insufficient documentation

## 2018-11-02 ENCOUNTER — Other Ambulatory Visit: Payer: Self-pay

## 2018-11-02 MED ORDER — TRIAMTERENE-HCTZ 37.5-25 MG PO TABS: ORAL_TABLET | ORAL | 11 refills | Status: DC

## 2018-11-07 ENCOUNTER — Ambulatory Visit: Payer: Self-pay | Admitting: Psychiatry

## 2018-12-20 ENCOUNTER — Encounter: Payer: Self-pay | Admitting: Psychiatry

## 2018-12-20 ENCOUNTER — Ambulatory Visit: Payer: Federal, State, Local not specified - PPO | Admitting: Psychiatry

## 2018-12-20 DIAGNOSIS — F338 Other recurrent depressive disorders: Secondary | ICD-10-CM | POA: Diagnosis not present

## 2018-12-20 DIAGNOSIS — F422 Mixed obsessional thoughts and acts: Secondary | ICD-10-CM

## 2018-12-20 MED ORDER — FLUOXETINE HCL 20 MG PO CAPS: 60.0000 mg | ORAL_CAPSULE | Freq: Every morning | ORAL | 3 refills | Status: DC

## 2018-12-20 MED ORDER — LORAZEPAM 0.5 MG PO TABS: 0.5000 mg | ORAL_TABLET | Freq: Every day | ORAL | 1 refills | Status: DC

## 2018-12-20 NOTE — Progress Notes (Signed)
[name] [id number] 1966/11/07 53 y.o.  Subjective:   Patient ID:  [name] is a 53 y.o. (DOB [birth year]) female.  Chief Complaint:  Chief Complaint  Patient presents with  . Follow-up    Medication Management    HPI Last seen June 2019 NOU LIZALDE presents to the office today for follow-up of depression with seasonality and anxiety.  Ok this winter so far.  Vitamin D helped.  Likes benefit lorazepam.  Needed more at christmas.  Otherwise usually ok.  Patient reports stable mood and denies depressed or irritable moods.  Patient denies any recent difficulty with anxiety.  Patient denies difficulty with sleep initiation or maintenance. Denies appetite disturbance.  Patient reports that energy and motivation have been good.  Patient denies any difficulty with concentration.  Patient denies any suicidal ideation.   Review of Systems:  Review of Systems  Musculoskeletal: Positive for arthralgias, back pain and gait problem.  Neurological: Negative for tremors and weakness.  Psychiatric/Behavioral: Negative for agitation, behavioral problems, confusion, decreased concentration, dysphoric mood, hallucinations, self-injury, sleep disturbance and suicidal ideas. The patient is not nervous/anxious and is not hyperactive.     Medications: I have reviewed the patient's current medications.  Current Outpatient Medications  Medication Sig Dispense Refill  . carvedilol (COREG) 25 MG tablet Take 2 tablets by mouth 2 (two) times daily.  5  . carvedilol (COREG) 25 MG tablet TAKE 1 TABLET TWICE DAILY 60 tablet 6  . Cholecalciferol (VITAMIN D3) 5000 UNITS TABS Take 1 tablet by mouth daily.    . Cobalamin Combinations (B-12) [phone] MCG SUBL B12    . diphenhydramine-acetaminophen (TYLENOL PM) 25-500 MG TABS Take 1 tablet by mouth at bedtime as needed.    Marland Kitchen FLUoxetine (PROZAC) 20 MG capsule Take 3 capsules (60 mg total) by mouth every morning. 270 capsule 3  . LORazepam (ATIVAN) 0.5  MG tablet Take 1 tablet (0.5 mg total) by mouth daily. 30 tablet 1  . Multiple Vitamin (MULTIVITAMIN WITH MINERALS) TABS tablet Take 1 tablet by mouth daily.    Marland Kitchen triamterene-hydrochlorothiazide (MAXZIDE-25) 37.5-25 MG tablet TAKE 1/2 TABLET BY MOUTH DAILY 15 tablet 11  . VESTURA 3-0.02 MG tablet Take 1 tablet by mouth daily.     No current facility-administered medications for this visit.     Medication Side Effects: None  Allergies:  Allergies  Allergen Reactions  . Darvocet [Propoxyphene N-Acetaminophen] Rash  . Penicillins Swelling    Past Medical History:  Diagnosis Date  . Anemia   . Anxiety   . Arthritis    knees   . Depression   . Headache    hx of migraines   . History of hiatal hernia   . Hypertension   . Morbid obesity with BMI of 60.0-69.9, adult (HCC)    BMI 62  . Murmur 08/15/2017  . Pneumonia 07/18/2011    Family History  Problem Relation Age of Onset  . Hypertension Mother   . Hypertension Father   . Cancer Father        prostate  . Diabetes Father   . Colon cancer Father   . Hypertension Brother   . Diabetes Paternal Aunt   . Stroke Paternal Aunt   . Mental illness Paternal Aunt   . Diabetes Paternal Uncle   . Breast cancer Maternal Grandmother   . Heart disease Maternal Grandfather   . Heart attack Maternal Grandfather   . Cancer Paternal Grandmother        liver  Social History   Socioeconomic History  . Marital status: Married    Spouse name: Not on file  . Number of children: Not on file  . Years of education: Not on file  . Highest education level: Not on file  Occupational History  . Not on file  Social Needs  . Financial resource strain: Not on file  . Food insecurity:    Worry: Not on file    Inability: Not on file  . Transportation needs:    Medical: Not on file    Non-medical: Not on file  Tobacco Use  . Smoking status: Never Smoker  . Smokeless tobacco: Never Used  Substance and Sexual Activity  . Alcohol use: No   . Drug use: No  . Sexual activity: Yes    Birth control/protection: Pill    Comment: azurette  Lifestyle  . Physical activity:    Days per week: Not on file    Minutes per session: Not on file  . Stress: Not on file  Relationships  . Social connections:    Talks on phone: Not on file    Gets together: Not on file    Attends religious service: Not on file    Active member of club or organization: Not on file    Attends meetings of clubs or organizations: Not on file    Relationship status: Not on file  . Intimate partner violence:    Fear of current or ex partner: Not on file    Emotionally abused: Not on file    Physically abused: Not on file    Forced sexual activity: Not on file  Other Topics Concern  . Not on file  Social History Narrative  . Not on file    Past Medical History, Surgical history, Social history, and Family history were reviewed and updated as appropriate.   Please see review of systems for further details on the patient's review from today.   Objective:   Physical Exam:  There were no vitals taken for this visit.  Physical Exam Constitutional:      General: She is not in acute distress.    Appearance: She is well-developed. She is obese.  Musculoskeletal:        General: No deformity.  Neurological:     Mental Status: She is alert and oriented to person, place, and time.     Motor: No tremor.     Coordination: Coordination normal.     Gait: Gait normal.  Psychiatric:        Attention and Perception: Attention and perception normal.        Mood and Affect: Mood is not anxious or depressed. Affect is not labile, blunt, angry or inappropriate.        Speech: Speech normal.        Behavior: Behavior normal.        Thought Content: Thought content normal. Thought content does not include homicidal or suicidal ideation. Thought content does not include homicidal or suicidal plan.        Cognition and Memory: Cognition normal.        Judgment:  Judgment normal.     Comments: Insight intact. No auditory or visual hallucinations. No delusions.      Lab Review:     Component Value Date/Time   NA 143 10/06/2017 1409   K 4.1 10/06/2017 1409   CL 104 10/06/2017 1409   CO2 22 10/06/2017 1409   GLUCOSE 153 (H) 10/06/2017 1409  GLUCOSE 107 (H) 01/03/2015 0540   BUN 15 10/06/2017 1409   CREATININE 0.52 (L) 10/06/2017 1409   CREATININE 0.57 08/25/2014 0836   CALCIUM 9.0 10/06/2017 1409   PROT 6.0 12/31/2014 0429   ALBUMIN 3.3 (L) 12/31/2014 0429   AST 48 (H) 12/31/2014 0429   ALT 48 (H) 12/31/2014 0429   ALKPHOS 62 12/31/2014 0429   BILITOT 0.6 12/31/2014 0429   GFRNONAA 110 10/06/2017 1409   GFRAA 127 10/06/2017 1409       Component Value Date/Time   WBC 14.2 (H) 01/04/2015 0621   RBC 4.38 01/04/2015 0621   HGB 12.7 01/04/2015 0621   HGB 13.1 06/05/2014 0920   HCT 39.6 01/04/2015 0621   HCT 41.1 06/05/2014 0920   PLT 314 01/04/2015 0621   PLT 301 06/05/2014 0920   MCV 90.4 01/04/2015 0621   MCV 89.2 06/05/2014 0920   MCH 29.0 01/04/2015 0621   MCHC 32.1 01/04/2015 0621   RDW 14.2 01/04/2015 0621   RDW 14.3 06/05/2014 0920   LYMPHSABS 1.4 01/01/2015 0621   LYMPHSABS 1.9 06/05/2014 0920   MONOABS 1.6 (H) 01/01/2015 0621   MONOABS 0.9 06/05/2014 0920   EOSABS 0.1 01/01/2015 0621   EOSABS 0.2 06/05/2014 0920   BASOSABS 0.0 01/01/2015 0621   BASOSABS 0.1 06/05/2014 0920    No results found for: POCLITH, LITHIUM   No results found for: PHENYTOIN, PHENOBARB, VALPROATE, CBMZ   .res Assessment: Plan:    Mixed obsessional thoughts and acts  Seasonal depression (HCC)   No change indicated.  We discussed the short-term risks associated with benzodiazepines including sedation and increased fall risk among others.  Discussed long-term side effect risk including dependence, potential withdrawal symptoms, and the potential eventual dose-related risk of dementia.  FU 6 mos  Meredith Staggersarey Cottle, MD, DFAPA   Please  see After Visit Summary for patient specific instructions.  Future Appointments  Date Time Provider Department Center  01/29/2019  8:00 AM Chilton Siandolph, Tiffany, MD [facility]    No orders of the defined types were placed in this encounter.     -------------------------------

## 2018-12-31 DIAGNOSIS — M170 Bilateral primary osteoarthritis of knee: Secondary | ICD-10-CM | POA: Diagnosis not present

## 2019-01-29 ENCOUNTER — Ambulatory Visit: Payer: Federal, State, Local not specified - PPO | Admitting: Cardiovascular Disease

## 2019-02-23 ENCOUNTER — Encounter: Payer: Self-pay | Admitting: *Deleted

## 2019-02-24 ENCOUNTER — Telehealth: Payer: Self-pay | Admitting: *Deleted

## 2019-02-24 NOTE — Telephone Encounter (Signed)
Left message to call back regarding appoitnment 4/1

## 2019-02-25 NOTE — Telephone Encounter (Signed)
Pt returned Melinda's call about scheduling a virtual office visit

## 2019-02-26 NOTE — Telephone Encounter (Signed)
Discussed video visit with patient, confirmed time and advised would call her tomorrow

## 2019-02-27 ENCOUNTER — Telehealth (INDEPENDENT_AMBULATORY_CARE_PROVIDER_SITE_OTHER): Payer: Federal, State, Local not specified - PPO | Admitting: Cardiovascular Disease

## 2019-02-27 ENCOUNTER — Encounter: Payer: Self-pay | Admitting: Cardiovascular Disease

## 2019-02-27 VITALS — BP 142/88 | HR 72 | Ht 69.0 in | Wt 390.0 lb

## 2019-02-27 DIAGNOSIS — Z5181 Encounter for therapeutic drug level monitoring: Secondary | ICD-10-CM

## 2019-02-27 DIAGNOSIS — R011 Cardiac murmur, unspecified: Secondary | ICD-10-CM

## 2019-02-27 DIAGNOSIS — I10 Essential (primary) hypertension: Secondary | ICD-10-CM

## 2019-02-27 MED ORDER — TRIAMTERENE-HCTZ 37.5-25 MG PO TABS: 1.0000 | ORAL_TABLET | Freq: Every day | ORAL | 1 refills | Status: DC

## 2019-02-27 NOTE — Progress Notes (Signed)
Virtual Visit via Video Note    Evaluation Performed:  Follow-up visit  This visit type was conducted due to national recommendations for restrictions regarding the COVID-19 Pandemic (e.g. social distancing).  This format is felt to be most appropriate for this patient at this time.  All issues noted in this document were discussed and addressed.  No physical exam was performed (except for noted visual exam findings with Video Visits).  Please refer to the patient's chart (MyChart message for video visits and phone note for telephone visits) for the patient's consent to telehealth for [facility].  Date:  02/27/2019   ID:  [name], DOB 11-Jan-1966, MRN [id number]  Patient Location:  home  Provider location:   office  PCP:  Juluis Rainier, MD  Cardiologist:  Chilton Si, MD   Chief Complaint:  Follow up  History of Present Illness:    [name] is a 53 y.o. female who presents via video conferencing for a telehealth visit today.    [name] is a 53 y.o. female with hypertension and morbid obesity who presents for follow up.  She was initially seen 07/2017 for an evaluation of a murmur.  Ms. Contrera went for her yearly exam with [name] 06/2017 and was noted to have a heart murmur.  She has seen [name] for years and this was a new finding.  She had an echo 08/22/17 that revealed LVEF 65-70% with grade 1 diastolic dysfunction, mildly calcified aortic valve gradients.  Metoprolol was switched to carvedilol due to elevated blood pressure.  HCTZ-triamterene was added and then increased by our pharmacist 09/2017.   Since her last appointment Ms. Hollimon has been well.  She hasn't had any chest pain or shortness of breath.  She denies lower extremity edema, orthopnea or PND.  She has no palpitations, lightheadedness or dizziness.  She hasn't been going to her water aerobics classes 2/2 COVID-19.  She already worked from home so that transition has been easy.  She  has been much more stressed because her daughter is now home all day.  Her BP at home has been in the 130s-140s/80s.  She has been otherwise well.   The patient does not have symptoms concerning for COVID-19 infection (fever, chills, cough, or new shortness of breath).    Prior CV studies:   The following studies were reviewed today:  Echo 08/22/17:  Study Conclusions  - Left ventricle: The cavity size was normal. There was moderate concentric hypertrophy. Systolic function was vigorous. The estimated ejection fraction was in the range of 65% to 70%. Wall motion was normal; there were no regional wall motion abnormalities. Doppler parameters are consistent with abnormal left ventricular relaxation (grade 1 diastolic dysfunction). - Aortic valve: Trileaflet; mildly thickened, mildly calcified leaflets. - Mitral valve: Moderately calcified annulus. There was trivial regurgitation. - Left atrium: The atrium was moderately dilated.   Past Medical History:  Diagnosis Date   Anemia    Anxiety    Arthritis    knees    Depression    Headache    hx of migraines    History of hiatal hernia    Hypertension    Morbid obesity with BMI of 60.0-69.9, adult (HCC)    BMI 62   Murmur 08/15/2017   Pneumonia 07/18/2011   Past Surgical History:  Procedure Laterality Date   APPENDECTOMY     CESAREAN SECTION     CHOLECYSTECTOMY     LAPAROSCOPIC ROUX-EN-Y GASTRIC BYPASS  WITH HIATAL HERNIA REPAIR N/A 12/30/2014   Procedure: LAPAROSCOPIC ROUX-EN-Y GASTRIC BYPASS WITH HIATAL HERNIA REPAIR AND UPPER ENDOSCOPY;  Surgeon: [name], MD;  Location: [facility];  Service: General;  Laterality: N/A;   TONSILLECTOMY       Current Meds  Medication Sig   carvedilol (COREG) 25 MG tablet TAKE 1 TABLET TWICE DAILY   Cholecalciferol (VITAMIN D3) 5000 UNITS TABS Take 1 tablet by mouth daily.   Cobalamin Combinations (B-12) [phone] MCG SUBL B12    diphenhydramine-acetaminophen (TYLENOL PM) 25-500 MG TABS Take 1 tablet by mouth at bedtime as needed.   FLUoxetine (PROZAC) 20 MG capsule Take 3 capsules (60 mg total) by mouth every morning.   LORazepam (ATIVAN) 0.5 MG tablet Take 0.5 mg by mouth daily as needed for anxiety.   Multiple Vitamin (MULTIVITAMIN WITH MINERALS) TABS tablet Take 1 tablet by mouth daily.   triamterene-hydrochlorothiazide (MAXZIDE-25) 37.5-25 MG tablet TAKE 1/2 TABLET BY MOUTH DAILY   VESTURA 3-0.02 MG tablet Take 1 tablet by mouth daily.     Allergies:   Darvocet [propoxyphene n-acetaminophen] and Penicillins   Social History   Tobacco Use   Smoking status: Never Smoker   Smokeless tobacco: Never Used  Substance Use Topics   Alcohol use: No   Drug use: No     Family Hx: The patient's family history includes Breast cancer in her maternal grandmother; Cancer in her father and paternal grandmother; Colon cancer in her father; Diabetes in her father, paternal aunt, and paternal uncle; Heart attack in her maternal grandfather; Heart disease in her maternal grandfather; Hypertension in her brother, father, and mother; Mental illness in her paternal aunt; Stroke in her paternal aunt.  ROS:   Please see the history of present illness.     All other systems reviewed and are negative.   Labs/Other Tests and Data Reviewed:    Recent Labs: No results found for requested labs within last 8760 hours.   Recent Lipid Panel Lab Results  Component Value Date/Time   CHOL 130 08/25/2014 08:36 AM   TRIG 149 08/25/2014 08:36 AM   HDL 46 08/25/2014 08:36 AM   CHOLHDL 2.8 08/25/2014 08:36 AM   LDLCALC 54 08/25/2014 08:36 AM    Wt Readings from Last 3 Encounters:  02/27/19 (!) 390 lb (176.9 kg)  01/26/18 (!) 401 lb 12.8 oz (182.3 kg)  09/28/17 (!) 394 lb 3.2 oz (178.8 kg)     Objective:    BP (!) 142/88    Pulse 72    Ht 5\' 9"  (1.753 m)    Wt (!) 390 lb (176.9 kg)    BMI 57.59 kg/m  GENERAL:  Well-appearing.  No acute distress. HEENT: Pupils equal round.  Oral mucosa unremarkable NECK:  No jugular venous distention, no visible thyromegaly EXT:  No edema, no cyanosis no clubbing SKIN:  No rashes no nodules NEURO:  Speech fluent.  Cranial nerves grossly intact.  Moves all 4 extremities freely PSYCH:  Cognitively intact, oriented to person place and time   ASSESSMENT & PLAN:     # Hypertension: BP is above goal of <130/80.  We will continue carvedilol and increase HCTZ-triamterene to 25/37.5mg  daily.  She will track her BP at home and return for lipids/CMP.  # Morbid obesity:  She will work on getting back into her exercise routine.  Check lipids as above.   # Aortic sclerosis: Aortic valve sclerosis but not stenosis.  She was advised of symptoms that warrant sooner follow-up including  shortness of breath, edema, lightheadedness, dizziness, and chest pain.  Repeat echo in 3-5 years.   COVID-19 Education: The signs and symptoms of COVID-19 were discussed with the patient and how to seek care for testing (follow up with PCP or arrange E-visit).  The importance of social distancing was discussed today.  Patient Risk:   After full review of this patient's clinical status, I feel that they are at least moderate risk at this time.  Time:   Today, I have spent 20 minutes with the patient with telehealth technology discussing hypertension, aortic valve disease, exercise.     Medication Adjustments/Labs and Tests Ordered: Current medicines are reviewed at length with the patient today.  Concerns regarding medicines are outlined above.  Tests Ordered: No orders of the defined types were placed in this encounter.  Medication Changes: No orders of the defined types were placed in this encounter.   Disposition:  Follow up in 1-2 months.   Signed, Chilton Si, MD  02/27/2019 10:05 AM    [facility]

## 2019-02-27 NOTE — Patient Instructions (Signed)
Medication Instructions:  INCREASE YOUR MAXZIDE TO A FULL TABLET DAILY   If you need a refill on your cardiac medications before your next appointment, please call your pharmacy.   Lab work: FASTING LP/CMET IN A BOUT 1 WEEK   If you have labs (blood work) drawn today and your tests are completely normal, you will receive your results only by: Marland Kitchen MyChart Message (if you have MyChart) OR . A paper copy in the mail If you have any lab test that is abnormal or we need to change your treatment, we will call you to review the results.  Testing/Procedures: NONE  Follow-Up: VIDEO VISIT WITH DR [facility] SapuLPa 04/17/19 AT 10:00 AM

## 2019-04-11 ENCOUNTER — Encounter: Payer: Self-pay | Admitting: *Deleted

## 2019-04-12 DIAGNOSIS — Z5181 Encounter for therapeutic drug level monitoring: Secondary | ICD-10-CM | POA: Diagnosis not present

## 2019-04-12 DIAGNOSIS — I10 Essential (primary) hypertension: Secondary | ICD-10-CM | POA: Diagnosis not present

## 2019-04-12 LAB — LIPID PANEL
Chol/HDL Ratio: 2.9 ratio (ref 0.0–4.4)
Cholesterol, Total: 171 mg/dL (ref 100–199)
HDL: 58 mg/dL (ref >39)
LDL Calculated: 93 mg/dL (ref 0–99)
Triglycerides: 100 mg/dL (ref 0–149)
VLDL Cholesterol Cal: 20 mg/dL (ref 5–40)

## 2019-04-12 LAB — COMPREHENSIVE METABOLIC PANEL
ALT: 11 IU/L (ref 0–32)
AST: 16 IU/L (ref 0–40)
Albumin/Globulin Ratio: 2 (ref 1.2–2.2)
Albumin: 4.3 g/dL (ref 3.8–4.9)
Alkaline Phosphatase: 114 IU/L (ref 39–117)
BUN/Creatinine Ratio: 22 (ref 9–23)
BUN: 13 mg/dL (ref 6–24)
Bilirubin Total: 0.7 mg/dL (ref 0.0–1.2)
CO2: 22 mmol/L (ref 20–29)
Calcium: 9.4 mg/dL (ref 8.7–10.2)
Chloride: 99 mmol/L (ref 96–106)
Creatinine, Ser: 0.59 mg/dL (ref 0.57–1.00)
GFR calc Af Amer: 121 mL/min/{1.73_m2} (ref >59)
GFR calc non Af Amer: 105 mL/min/{1.73_m2} (ref >59)
Globulin, Total: 2.2 g/dL (ref 1.5–4.5)
Glucose: 98 mg/dL (ref 65–99)
Potassium: 4 mmol/L (ref 3.5–5.2)
Sodium: 140 mmol/L (ref 134–144)
Total Protein: 6.5 g/dL (ref 6.0–8.5)

## 2019-04-16 ENCOUNTER — Telehealth: Payer: Self-pay | Admitting: Cardiovascular Disease

## 2019-04-17 ENCOUNTER — Telehealth (INDEPENDENT_AMBULATORY_CARE_PROVIDER_SITE_OTHER): Payer: Federal, State, Local not specified - PPO | Admitting: Cardiovascular Disease

## 2019-04-17 DIAGNOSIS — I10 Essential (primary) hypertension: Secondary | ICD-10-CM

## 2019-04-17 DIAGNOSIS — Z6841 Body Mass Index (BMI) 40.0 and over, adult: Secondary | ICD-10-CM

## 2019-04-17 NOTE — Progress Notes (Signed)
Virtual Visit via Video Note   This visit type was conducted due to national recommendations for restrictions regarding the COVID-19 Pandemic (e.g. social distancing) in an effort to limit this patient's exposure and mitigate transmission in our community.  Due to her co-morbid illnesses, this patient is at least at moderate risk for complications without adequate follow up.  This format is felt to be most appropriate for this patient at this time.  All issues noted in this document were discussed and addressed.  A limited physical exam was performed with this format.  Please refer to the patient's chart for her consent to telehealth for [facility].   Date:  04/17/2019   ID:  [name], DOB 1966-07-14, MRN [id number]  Patient Location: Home Provider Location: Home  PCP:  Juluis Rainier, MD  Cardiologist:  Chilton Si, MD  Electrophysiologist:  None   Evaluation Performed:  Follow-Up Visit  Chief Complaint:  Hypertension.  History of Present Illness:    [name] a 53 y.o.femalewith hypertension and morbid obesity who presents forfollow up. She was initially seen 07/2017 foran evaluation of a murmur. [name] went for her yearly exam with [name] 06/2017 and was noted to have a heart murmur. She has seen [name] for years and this was a new finding. She had an echo 08/22/17 that revealed LVEF 65-70% with grade 1 diastolic dysfunction, mildly calcified aortic valve gradients. Metoprolol was switched to carvedilol due to elevated blood pressure. HCTZ-triamterene was added and then increased by ourpharmacist 09/2017.  At her last appointment Ms. Tashiro' blood pressure remained elevated so HCTZ/triamterene was increased.  She was also encouraged to get back into her exercise routine.  Since that time her BP has been more consistently <130/80.  Her home BP cuff runs consistently 20 mmHg higher than our office cuff.  She has no chest pain or shortness of  breath with activity.  She has been walking three days per week and has no exertional symptoms.  She has no lower extremity edema, orthopnea or PND.    The patient does not have symptoms concerning for COVID-19 infection (fever, chills, cough, or new shortness of breath).    Past Medical History:  Diagnosis Date   Anemia    Anxiety    Arthritis    knees    Depression    Headache    hx of migraines    History of hiatal hernia    Hypertension    Morbid obesity with BMI of 60.0-69.9, adult (HCC)    BMI 62   Murmur 08/15/2017   Pneumonia 07/18/2011   Past Surgical History:  Procedure Laterality Date   APPENDECTOMY     CESAREAN SECTION     CHOLECYSTECTOMY     LAPAROSCOPIC ROUX-EN-Y GASTRIC BYPASS WITH HIATAL HERNIA REPAIR N/A 12/30/2014   Procedure: LAPAROSCOPIC ROUX-EN-Y GASTRIC BYPASS WITH HIATAL HERNIA REPAIR AND UPPER ENDOSCOPY;  Surgeon: [name], MD;  Location: [facility];  Service: General;  Laterality: N/A;   TONSILLECTOMY       Current Meds  Medication Sig   carvedilol (COREG) 25 MG tablet TAKE 1 TABLET TWICE DAILY   Cholecalciferol (VITAMIN D3) 5000 UNITS TABS Take 1 tablet by mouth daily.   Cobalamin Combinations (B-12) [phone] MCG SUBL B12   diphenhydramine-acetaminophen (TYLENOL PM) 25-500 MG TABS Take 1 tablet by mouth at bedtime as needed.   FLUoxetine (PROZAC) 20 MG capsule Take 3 capsules (60 mg total) by mouth every morning.   LORazepam (  ATIVAN) 0.5 MG tablet Take 0.5 mg by mouth daily as needed for anxiety.   Multiple Vitamin (MULTIVITAMIN WITH MINERALS) TABS tablet Take 1 tablet by mouth daily.   triamterene-hydrochlorothiazide (MAXZIDE-25) 37.5-25 MG tablet Take 1 tablet by mouth daily.   VESTURA 3-0.02 MG tablet Take 1 tablet by mouth daily.     Allergies:   Darvocet [propoxyphene n-acetaminophen] and Penicillins   Social History   Tobacco Use   Smoking status: Never Smoker   Smokeless tobacco: Never Used  Substance Use  Topics   Alcohol use: No   Drug use: No     Family Hx: The patient's family history includes Breast cancer in her maternal grandmother; Cancer in her father and paternal grandmother; Colon cancer in her father; Diabetes in her father, paternal aunt, and paternal uncle; Heart attack in her maternal grandfather; Heart disease in her maternal grandfather; Hypertension in her brother, father, and mother; Mental illness in her paternal aunt; Stroke in her paternal aunt.  ROS:   Please see the history of present illness.     All other systems reviewed and are negative.   Prior CV studies:   The following studies were reviewed today:  Echo 08/22/17:  Study Conclusions  - Left ventricle: The cavity size was normal. There was moderate concentric hypertrophy. Systolic function was vigorous. The estimated ejection fraction was in the range of 65% to 70%. Wall motion was normal; there were no regional wall motion abnormalities. Doppler parameters are consistent with abnormal left ventricular relaxation (grade 1 diastolic dysfunction). - Aortic valve: Trileaflet; mildly thickened, mildly calcified leaflets. - Mitral valve: Moderately calcified annulus. There was trivial regurgitation. - Left atrium: The atrium was moderately dilated.   Labs/Other Tests and Data Reviewed:    EKG:    Recent Labs: 04/12/2019: ALT 11; BUN 13; Creatinine, Ser 0.59; Potassium 4.0; Sodium 140   Recent Lipid Panel Lab Results  Component Value Date/Time   CHOL 171 04/12/2019 08:12 AM   TRIG 100 04/12/2019 08:12 AM   HDL 58 04/12/2019 08:12 AM   CHOLHDL 2.9 04/12/2019 08:12 AM   CHOLHDL 2.8 08/25/2014 08:36 AM   LDLCALC 93 04/12/2019 08:12 AM    Wt Readings from Last 3 Encounters:  04/17/19 (!) 390 lb (176.9 kg)  02/27/19 (!) 390 lb (176.9 kg)  01/26/18 (!) 401 lb 12.8 oz (182.3 kg)     Objective:    BP 130/79 Comment: Last night.   Pulse 68    Ht 5\' 9"  (1.753 m)    Wt (!) 390 lb  (176.9 kg)    BMI 57.59 kg/m  GENERAL: Well-appearing.  No acute distress. HEENT: Pupils equal round.  Oral mucosa unremarkable NECK:  No jugular venous distention, no visible thyromegaly EXT:  No edema, no cyanosis no clubbing SKIN:  No rashes no nodules NEURO:  Speech fluent.  Cranial nerves grossly intact.  Moves all 4 extremities freely PSYCH:  Cognitively intact, oriented to person place and time   ASSESSMENT & PLAN:    # Hypertension:BP improved after increasing HCTZ-triamterene.  BMP stable.   # Morbid obesity:  She was encouraged to keep up the exercise.  LDL 93 on 03/2019.   # Aortic sclerosis: Aortic valve sclerosis but not stenosis.Shewas advised of symptoms that warrant sooner follow-up including shortness of breath, edema,lightheadedness, dizziness, and chest pain.  Repeat echo in 3-5 years.   COVID-19 Education: The signs and symptoms of COVID-19 were discussed with the patient and how to seek care for testing (follow  up with PCP or arrange E-visit).  The importance of social distancing was discussed today.   COVID-19 Education: The signs and symptoms of COVID-19 were discussed with the patient and how to seek care for testing (follow up with PCP or arrange E-visit).  The importance of social distancing was discussed today.  Time:   Today, I have spent 12 minutes with the patient with telehealth technology discussing the above problems.     Medication Adjustments/Labs and Tests Ordered: Current medicines are reviewed at length with the patient today.  Concerns regarding medicines are outlined above.   Tests Ordered: No orders of the defined types were placed in this encounter.   Medication Changes: No orders of the defined types were placed in this encounter.   Disposition:  Follow up in 6 month(s)  Signed, Chilton Siiffany [place], MD  04/17/2019 10:55 AM    [facility]

## 2019-04-17 NOTE — Patient Instructions (Addendum)
Medication Instructions:  Your physician recommends that you continue on your current medications as directed. Please refer to the Current Medication list given to you today.  If you need a refill on your cardiac medications before your next appointment, please call your pharmacy.   Lab work: NONE If you have labs (blood work) drawn today and your tests are completely normal, you will receive your results only by: . MyChart Message (if you have MyChart) OR . A paper copy in the mail If you have any lab test that is abnormal or we need to change your treatment, we will call you to review the results.  Testing/Procedures: NONE  Follow-Up: At [facility], you and your health needs are our priority.  As part of our continuing mission to provide you with exceptional heart care, we have created designated Provider Care Teams.  These Care Teams include your primary Cardiologist (physician) and Advanced Practice Providers (APPs -  Physician Assistants and Nurse Practitioners) who all work together to provide you with the care you need, when you need it. You will need a follow up appointment in 6 months.  Please call our office 2 months in advance to schedule this appointment.  You may see Jermall Isaacson , MD or one of the following Advanced Practice Providers on your designated Care Team:   Luke Kilroy, PA-C Krista Kroeger, PA-C . Callie Goodrich, PA-C     

## 2019-04-27 ENCOUNTER — Other Ambulatory Visit: Payer: Self-pay | Admitting: Cardiovascular Disease

## 2019-05-21 DIAGNOSIS — M170 Bilateral primary osteoarthritis of knee: Secondary | ICD-10-CM | POA: Diagnosis not present

## 2019-05-29 ENCOUNTER — Ambulatory Visit (INDEPENDENT_AMBULATORY_CARE_PROVIDER_SITE_OTHER): Payer: Self-pay | Admitting: Family Medicine

## 2019-06-05 ENCOUNTER — Ambulatory Visit (INDEPENDENT_AMBULATORY_CARE_PROVIDER_SITE_OTHER): Payer: Self-pay | Admitting: Psychology

## 2019-06-12 ENCOUNTER — Ambulatory Visit (INDEPENDENT_AMBULATORY_CARE_PROVIDER_SITE_OTHER): Payer: Self-pay | Admitting: Family Medicine

## 2019-06-20 ENCOUNTER — Encounter: Payer: Self-pay | Admitting: Psychiatry

## 2019-06-20 ENCOUNTER — Ambulatory Visit: Payer: Federal, State, Local not specified - PPO | Admitting: Psychiatry

## 2019-06-20 ENCOUNTER — Ambulatory Visit (INDEPENDENT_AMBULATORY_CARE_PROVIDER_SITE_OTHER): Payer: Federal, State, Local not specified - PPO | Admitting: Psychiatry

## 2019-06-20 ENCOUNTER — Other Ambulatory Visit: Payer: Self-pay

## 2019-06-20 DIAGNOSIS — F422 Mixed obsessional thoughts and acts: Secondary | ICD-10-CM | POA: Diagnosis not present

## 2019-06-20 DIAGNOSIS — F338 Other recurrent depressive disorders: Secondary | ICD-10-CM | POA: Diagnosis not present

## 2019-06-20 NOTE — Progress Notes (Signed)
Tamara RiggsDenise B [place] [id number] 01/09/1966 53 y.o.  Subjective:   Patient ID:  [name] is a 53 y.o. (DOB 08/07/1966) female.  Chief Complaint:  Chief Complaint  Patient presents with  . Follow-up    Medication Management  . Other    OCD     HPI  Tamara RiggsDenise B Hobby presents to the office today for follow-up of depression with seasonality and anxiety.    Last seen January 2020.  No meds were changed.  Difficult to stay home all these months.  Good vacation helped.  Mood generally OK but Covid hard and stress of school uncertainties.  Before vacation hard to get OOB but better since then.  Not really obsessing.  Works from home.  That helps keep her on track and schedule.  Ok this winter so far.  Vitamin D helped.  Likes benefit lorazepam.  Only used rarely.  Otherwise usually ok.  Patient reports stable mood and denies depressed or irritable moods.  Patient denies any recent difficulty with anxiety.  Patient denies difficulty with sleep initiation or maintenance. Denies appetite disturbance.  Patient reports that energy and motivation have been good.  Patient denies any difficulty with concentration.  Patient denies any suicidal ideation.  Past Psychiatric Medication Trials:' no others.  Review of Systems:  Review of Systems  Musculoskeletal: Positive for arthralgias, back pain and gait problem.  Neurological: Negative for tremors and weakness.  Psychiatric/Behavioral: Negative for agitation, behavioral problems, confusion, decreased concentration, dysphoric mood, hallucinations, self-injury, sleep disturbance and suicidal ideas. The patient is not nervous/anxious and is not hyperactive.     Medications: I have reviewed the patient's current medications.  Current Outpatient Medications  Medication Sig Dispense Refill  . carvedilol (COREG) 25 MG tablet TAKE 1 TABLET TWICE DAILY 180 tablet 3  . Cholecalciferol (VITAMIN D3) 5000 UNITS TABS Take 1 tablet by mouth daily.    .  Cobalamin Combinations (B-12) [phone] MCG SUBL B12    . diphenhydramine-acetaminophen (TYLENOL PM) 25-500 MG TABS Take 1 tablet by mouth at bedtime as needed.    Marland Kitchen. FLUoxetine (PROZAC) 20 MG capsule Take 3 capsules (60 mg total) by mouth every morning. 270 capsule 3  . LORazepam (ATIVAN) 0.5 MG tablet Take 0.5 mg by mouth daily as needed for anxiety.    . Multiple Vitamin (MULTIVITAMIN WITH MINERALS) TABS tablet Take 1 tablet by mouth daily.    Marland Kitchen. triamterene-hydrochlorothiazide (MAXZIDE-25) 37.5-25 MG tablet Take 1 tablet by mouth daily. 90 tablet 1  . VESTURA 3-0.02 MG tablet Take 1 tablet by mouth daily.     No current facility-administered medications for this visit.     Medication Side Effects: None  Allergies:  Allergies  Allergen Reactions  . Darvocet [Propoxyphene N-Acetaminophen] Rash  . Penicillins Swelling    Past Medical History:  Diagnosis Date  . Anemia   . Anxiety   . Arthritis    knees   . Depression   . Headache    hx of migraines   . History of hiatal hernia   . Hypertension   . Morbid obesity with BMI of 60.0-69.9, adult (HCC)    BMI 62  . Murmur 08/15/2017  . Pneumonia 07/18/2011    Family History  Problem Relation Age of Onset  . Hypertension Mother   . Hypertension Father   . Cancer Father        prostate  . Diabetes Father   . Colon cancer Father   . Hypertension Brother   . Diabetes Paternal  Aunt   . Stroke Paternal Aunt   . Mental illness Paternal Aunt   . Diabetes Paternal Uncle   . Breast cancer Maternal Grandmother   . Heart disease Maternal Grandfather   . Heart attack Maternal Grandfather   . Cancer Paternal Grandmother        liver    Social History   Socioeconomic History  . Marital status: Married    Spouse name: Not on file  . Number of children: Not on file  . Years of education: Not on file  . Highest education level: Not on file  Occupational History  . Not on file  Social Needs  . Financial resource strain: Not on  file  . Food insecurity    Worry: Not on file    Inability: Not on file  . Transportation needs    Medical: Not on file    Non-medical: Not on file  Tobacco Use  . Smoking status: Never Smoker  . Smokeless tobacco: Never Used  Substance and Sexual Activity  . Alcohol use: No  . Drug use: No  . Sexual activity: Yes    Birth control/protection: Pill    Comment: azurette  Lifestyle  . Physical activity    Days per week: Not on file    Minutes per session: Not on file  . Stress: Not on file  Relationships  . Social Musicianconnections    Talks on phone: Not on file    Gets together: Not on file    Attends religious service: Not on file    Active member of club or organization: Not on file    Attends meetings of clubs or organizations: Not on file    Relationship status: Not on file  . Intimate partner violence    Fear of current or ex partner: Not on file    Emotionally abused: Not on file    Physically abused: Not on file    Forced sexual activity: Not on file  Other Topics Concern  . Not on file  Social History Narrative  . Not on file    Past Medical History, Surgical history, Social history, and Family history were reviewed and updated as appropriate.   Please see review of systems for further details on the patient's review from today.   Objective:   Physical Exam:  There were no vitals taken for this visit.  Physical Exam Constitutional:      General: She is not in acute distress.    Appearance: She is well-developed. She is obese.  Musculoskeletal:        General: No deformity.  Neurological:     Mental Status: She is alert and oriented to person, place, and time.     Motor: No tremor.     Coordination: Coordination normal.     Gait: Gait normal.  Psychiatric:        Attention and Perception: Attention and perception normal.        Mood and Affect: Mood is not anxious or depressed. Affect is not labile, blunt, angry or inappropriate.        Speech: Speech  normal.        Behavior: Behavior normal.        Thought Content: Thought content normal. Thought content does not include homicidal or suicidal ideation. Thought content does not include homicidal or suicidal plan.        Cognition and Memory: Cognition normal.        Judgment: Judgment normal.  Comments: Insight intact. No auditory or visual hallucinations. No delusions.      Lab Review:     Component Value Date/Time   NA 140 04/12/2019 0812   K 4.0 04/12/2019 0812   CL 99 04/12/2019 0812   CO2 22 04/12/2019 0812   GLUCOSE 98 04/12/2019 0812   GLUCOSE 107 (H) 01/03/2015 0540   BUN 13 04/12/2019 0812   CREATININE 0.59 04/12/2019 0812   CREATININE 0.57 08/25/2014 0836   CALCIUM 9.4 04/12/2019 0812   PROT 6.5 04/12/2019 0812   ALBUMIN 4.3 04/12/2019 0812   AST 16 04/12/2019 0812   ALT 11 04/12/2019 0812   ALKPHOS 114 04/12/2019 0812   BILITOT 0.7 04/12/2019 0812   GFRNONAA 105 04/12/2019 0812   GFRAA 121 04/12/2019 0812       Component Value Date/Time   WBC 14.2 (H) 01/04/2015 0621   RBC 4.38 01/04/2015 0621   HGB 12.7 01/04/2015 0621   HGB 13.1 06/05/2014 0920   HCT 39.6 01/04/2015 0621   HCT 41.1 06/05/2014 0920   PLT 314 01/04/2015 0621   PLT 301 06/05/2014 0920   MCV 90.4 01/04/2015 0621   MCV 89.2 06/05/2014 0920   MCH 29.0 01/04/2015 0621   MCHC 32.1 01/04/2015 0621   RDW 14.2 01/04/2015 0621   RDW 14.3 06/05/2014 0920   LYMPHSABS 1.4 01/01/2015 0621   LYMPHSABS 1.9 06/05/2014 0920   MONOABS 1.6 (H) 01/01/2015 0621   MONOABS 0.9 06/05/2014 0920   EOSABS 0.1 01/01/2015 0621   EOSABS 0.2 06/05/2014 0920   BASOSABS 0.0 01/01/2015 0621   BASOSABS 0.1 06/05/2014 0920    No results found for: POCLITH, LITHIUM   No results found for: PHENYTOIN, PHENOBARB, VALPROATE, CBMZ   .res Assessment: Plan:    [place] was seen today for follow-up and other.  Diagnoses and all orders for this visit:  Mixed obsessional thoughts and acts  Seasonal depression  ([place])    No change indicated.  We discussed the short-term risks associated with benzodiazepines including sedation and increased fall risk among others.  Discussed long-term side effect risk including dependence, potential withdrawal symptoms, and the potential eventual dose-related risk of dementia.  FU 6 mos  Lynder Parents, MD, DFAPA   Please see After Visit Summary for patient specific instructions.  No future appointments.  No orders of the defined types were placed in this encounter.     -------------------------------

## 2019-07-26 DIAGNOSIS — Z01419 Encounter for gynecological examination (general) (routine) without abnormal findings: Secondary | ICD-10-CM | POA: Diagnosis not present

## 2019-07-26 DIAGNOSIS — Z1231 Encounter for screening mammogram for malignant neoplasm of breast: Secondary | ICD-10-CM | POA: Diagnosis not present

## 2019-07-26 DIAGNOSIS — Z124 Encounter for screening for malignant neoplasm of cervix: Secondary | ICD-10-CM | POA: Diagnosis not present

## 2019-07-26 DIAGNOSIS — Z6841 Body Mass Index (BMI) 40.0 and over, adult: Secondary | ICD-10-CM | POA: Diagnosis not present

## 2019-08-08 ENCOUNTER — Other Ambulatory Visit: Payer: Self-pay | Admitting: Psychiatry

## 2019-09-03 ENCOUNTER — Ambulatory Visit: Payer: Federal, State, Local not specified - PPO | Admitting: Cardiology

## 2019-09-05 DIAGNOSIS — F429 Obsessive-compulsive disorder, unspecified: Secondary | ICD-10-CM | POA: Diagnosis not present

## 2019-09-05 DIAGNOSIS — Z23 Encounter for immunization: Secondary | ICD-10-CM | POA: Diagnosis not present

## 2019-09-05 DIAGNOSIS — Z9884 Bariatric surgery status: Secondary | ICD-10-CM | POA: Diagnosis not present

## 2019-09-05 DIAGNOSIS — G479 Sleep disorder, unspecified: Secondary | ICD-10-CM | POA: Diagnosis not present

## 2019-11-12 ENCOUNTER — Ambulatory Visit: Payer: Federal, State, Local not specified - PPO | Admitting: Cardiovascular Disease

## 2019-11-12 ENCOUNTER — Encounter: Payer: Self-pay | Admitting: Cardiovascular Disease

## 2019-11-12 ENCOUNTER — Other Ambulatory Visit: Payer: Self-pay

## 2019-11-12 VITALS — BP 124/64 | HR 73 | Temp 97.0°F | Ht 69.0 in | Wt >= 6400 oz

## 2019-11-12 DIAGNOSIS — I10 Essential (primary) hypertension: Secondary | ICD-10-CM | POA: Diagnosis not present

## 2019-11-12 DIAGNOSIS — I358 Other nonrheumatic aortic valve disorders: Secondary | ICD-10-CM | POA: Diagnosis not present

## 2019-11-12 NOTE — Patient Instructions (Signed)
Medication Instructions:  Your physician recommends that you continue on your current medications as directed. Please refer to the Current Medication list given to you today.  *If you need a refill on your cardiac medications before your next appointment, please call your pharmacy*  Lab Work: NONE   Testing/Procedures: NONE   Follow-Up: At [facility], you and your health needs are our priority.  As part of our continuing mission to provide you with exceptional heart care, we have created designated Provider Care Teams.  These Care Teams include your primary Cardiologist (physician) and Advanced Practice Providers (APPs -  Physician Assistants and Nurse Practitioners) who all work together to provide you with the care you need, when you need it.  Your next appointment:   12 month(s)  The format for your next appointment:   In Person  Provider:   You may see Skeet Latch, MD or one of the following Advanced Practice Providers on your designated Care Team:    Kerin Ransom, PA-C  [place], [place]  Coletta Memos, [place]

## 2019-11-12 NOTE — Progress Notes (Signed)
Cardiology Office Note   Date:  11/12/2019   ID:  [name], DOB [birth year], MRN [id number]  PCP:  Tamara Rainier, MD  Cardiologist:   Tamara Si, MD   No chief complaint on file.     History of Present Illness: [name] is a 53 y.o. female who presents for hypertension and morbid obesity who presents forfollow up. She was initially seen 07/2017 foran evaluation of a murmur. [name] went for her yearly exam with Dr. Estanislado Townsend 06/2017 and was noted to have a heart murmur. She has seen Dr. Estanislado Townsend for years and this was a new finding. She had an echo 08/22/17 that revealed LVEF 65-70% with grade 1 diastolic dysfunction, mildly calcified aortic valve gradients. Metoprolol was switched to carvedilol due to elevated blood pressure.HCTZ-triamterene was added and then increased by ourpharmacist 09/2017.  Since her last appointment Ms. Rae has been doing well.  She has been exercising by walking and going to the [employer] at least twice per week.  She walks for 30 minutes and has no exertional chest pain or shortness of breath.  She denies lower extremity edema, orthopnea, or PND.  Her main limitation is pain in her knees from arthritis.  She checks her blood pressure at home but does not think her machine is very accurate.  When she goes to her doctor's appointment her blood pressure is well-controlled.  Her husband works as a Retail banker and she has been very stressed lately due to long hours for the holidays.  She was initially eating more due to being home for the coronavirus.  However lately she has been able to lose 6 pounds.   Past Medical History:  Diagnosis Date  . Anemia   . Anxiety   . Arthritis    knees   . Depression   . Headache    hx of migraines   . History of hiatal hernia   . Hypertension   . Morbid obesity with BMI of 60.0-69.9, adult (HCC)    BMI 62  . Murmur 08/15/2017  . Pneumonia 07/18/2011    Past Surgical History:  Procedure Laterality  Date  . APPENDECTOMY    . CESAREAN SECTION    . CHOLECYSTECTOMY    . LAPAROSCOPIC ROUX-EN-Y GASTRIC BYPASS WITH HIATAL HERNIA REPAIR N/A 12/30/2014   Procedure: LAPAROSCOPIC ROUX-EN-Y GASTRIC BYPASS WITH HIATAL HERNIA REPAIR AND UPPER ENDOSCOPY;  Surgeon: Tamara Ina, MD;  Location: [facility];  Service: General;  Laterality: N/A;  . TONSILLECTOMY       Current Outpatient Medications  Medication Sig Dispense Refill  . carvedilol (COREG) 25 MG tablet TAKE 1 TABLET TWICE DAILY 180 tablet 3  . Cholecalciferol (VITAMIN D3) 5000 UNITS TABS Take 1 tablet by mouth daily.    . Cobalamin Combinations (B-12) [phone] MCG SUBL B12    . diphenhydramine-acetaminophen (TYLENOL PM) 25-500 MG TABS Take 1 tablet by mouth at bedtime as needed.    Marland Kitchen FLUoxetine (PROZAC) 20 MG capsule Take 3 capsules (60 mg total) by mouth every morning. 270 capsule 3  . LORazepam (ATIVAN) 0.5 MG tablet TAKE 1 TABLET(0.5 MG) BY MOUTH DAILY 30 tablet 0  . Multiple Vitamin (MULTIVITAMIN WITH MINERALS) TABS tablet Take 1 tablet by mouth daily.    Marland Kitchen triamterene-hydrochlorothiazide (MAXZIDE-25) 37.5-25 MG tablet Take 1 tablet by mouth daily. 90 tablet 1  . VESTURA 3-0.02 MG tablet Take 1 tablet by mouth daily.     No current facility-administered medications for this visit.  Allergies:   Darvocet [propoxyphene n-acetaminophen] and Penicillins    Social History:  The patient  reports that she has never smoked. She has never used smokeless tobacco. She reports that she does not drink alcohol or use drugs.   Family History:  The patient's family history includes Breast cancer in her maternal grandmother; Cancer in her father and paternal grandmother; Colon cancer in her father; Diabetes in her father, paternal aunt, and paternal uncle; Heart attack in her maternal grandfather; Heart disease in her maternal grandfather; Hypertension in her brother, father, and mother; Mental illness in her paternal aunt; Stroke in her paternal aunt.     ROS:  Please see the history of present illness.   Otherwise, review of systems are positive for none.   All other systems are reviewed and negative.    PHYSICAL EXAM: VS:  BP (!) 144/79   Pulse 73   Temp (!) 97 F (36.1 C)   Ht 5\' 9"  (1.753 m)   Wt (!) 410 lb (186 kg)   SpO2 97%   BMI 60.55 kg/m  , BMI Body mass index is 60.55 kg/m. GENERAL:  Well appearing HEENT:  Pupils equal round and reactive, fundi not visualized, oral mucosa unremarkable NECK:  No jugular venous distention, waveform within normal limits, carotid upstroke brisk and symmetric, no bruits LUNGS:  Clear to auscultation bilaterally HEART:  RRR.  PMI not displaced or sustained,S1 and S2 within normal limits, no S3, no S4, no clicks, no rubs, II/VI systolic murmur at the LUSB ABD:  Flat, positive bowel sounds normal in frequency in pitch, no bruits, no rebound, no guarding, no midline pulsatile mass, no hepatomegaly, no splenomegaly EXT:  2 plus pulses throughout, no edema, no cyanosis no clubbing SKIN:  No rashes no nodules NEURO:  Cranial nerves II through XII grossly intact, motor grossly intact throughout PSYCH:  Cognitively intact, oriented to person place and time    EKG:  EKG is ordered today. The ekg ordered today demonstrates sinus rhythm.  Rate 70 bpm.  Right axis deviation.  Echo 08/22/17:  Study Conclusions  - Left ventricle: The cavity size was normal. There was moderate concentric hypertrophy. Systolic function was vigorous. The estimated ejection fraction was in the range of 65% to 70%. Wall motion was normal; there were no regional wall motion abnormalities. Doppler parameters are consistent with abnormal left ventricular relaxation (grade 1 diastolic dysfunction). - Aortic valve: Trileaflet; mildly thickened, mildly calcified leaflets. - Mitral valve: Moderately calcified annulus. There was trivial regurgitation. - Left atrium: The atrium was moderately  dilated.   Recent Labs: 04/12/2019: ALT 11; BUN 13; Creatinine, Ser 0.59; Potassium 4.0; Sodium 140    Lipid Panel    Component Value Date/Time   CHOL 171 04/12/2019 0812   TRIG 100 04/12/2019 0812   HDL 58 04/12/2019 0812   CHOLHDL 2.9 04/12/2019 0812   CHOLHDL 2.8 08/25/2014 0836   VLDL 30 08/25/2014 0836   LDLCALC 93 04/12/2019 0812      Wt Readings from Last 3 Encounters:  11/12/19 (!) 410 lb (186 kg)  04/17/19 (!) 390 lb (176.9 kg)  02/27/19 (!) 390 lb (176.9 kg)      ASSESSMENT AND PLAN:  # Hypertension:BPwell-controlled on carvedilol and Maxide.  No changes.  # Morbid obesity:  She was encouraged to increase her exercise to 150 minutes weekly.  LDL 93 on 03/2019.   #Aortic sclerosis:Aortic valve sclerosis but not stenosis.She remains asymptomatic.  Shewas advised of symptoms that warrant sooner follow-up  including shortness of breath, edema,lightheadedness, dizziness, and chest pain.Repeat echo in 3-5 years.     Current medicines are reviewed at length with the patient today.  The patient does not have concerns regarding medicines.  The following changes have been made:  no change  Labs/ tests ordered today include:  No orders of the defined types were placed in this encounter.    Disposition:   FU with Kavian Peters C. Duke Salviaandolph, MD, [facility] in 1 year.     Signed, Aarini Slee C. Duke Salviaandolph, MD, [facility]  11/12/2019 8:05 AM    [facility]

## 2019-11-15 ENCOUNTER — Other Ambulatory Visit: Payer: Self-pay | Admitting: Psychiatry

## 2019-11-17 ENCOUNTER — Other Ambulatory Visit: Payer: Self-pay | Admitting: Cardiovascular Disease

## 2019-12-16 DIAGNOSIS — M170 Bilateral primary osteoarthritis of knee: Secondary | ICD-10-CM | POA: Diagnosis not present

## 2020-02-10 ENCOUNTER — Other Ambulatory Visit: Payer: Self-pay | Admitting: Cardiovascular Disease

## 2020-02-13 ENCOUNTER — Other Ambulatory Visit: Payer: Self-pay | Admitting: Psychiatry

## 2020-02-13 NOTE — Telephone Encounter (Signed)
Last apt 05/2019, due back 6 months nothing scheduled yet

## 2020-04-02 DIAGNOSIS — M170 Bilateral primary osteoarthritis of knee: Secondary | ICD-10-CM | POA: Diagnosis not present

## 2020-04-10 ENCOUNTER — Encounter: Payer: Self-pay | Admitting: Psychiatry

## 2020-04-10 ENCOUNTER — Telehealth (INDEPENDENT_AMBULATORY_CARE_PROVIDER_SITE_OTHER): Payer: Federal, State, Local not specified - PPO | Admitting: Psychiatry

## 2020-04-10 DIAGNOSIS — F338 Other recurrent depressive disorders: Secondary | ICD-10-CM

## 2020-04-10 DIAGNOSIS — F422 Mixed obsessional thoughts and acts: Secondary | ICD-10-CM | POA: Diagnosis not present

## 2020-04-10 MED ORDER — LORAZEPAM 0.5 MG PO TABS: 0.5000 mg | ORAL_TABLET | Freq: Three times a day (TID) | ORAL | 3 refills | Status: DC | PRN

## 2020-04-10 MED ORDER — FLUOXETINE HCL 20 MG PO CAPS: 60.0000 mg | ORAL_CAPSULE | Freq: Every day | ORAL | 3 refills | Status: DC

## 2020-04-10 NOTE — Progress Notes (Signed)
[name] [id number] [birth year] 54 y.o. Virtual Visit via WebEX  I connected with pt by WebEx and verified that I am speaking with the correct person using two identifiers.   I discussed the limitations, risks, security and privacy concerns of performing an evaluation and management service by [facility] [place] and the availability of in person appointments. I also discussed with the patient that there may be a patient responsible charge related to this service. The patient expressed understanding and agreed to proceed.  I discussed the assessment and treatment plan with the patient. The patient was provided an opportunity to ask questions and all were answered. The patient agreed with the plan and demonstrated an understanding of the instructions.   The patient was advised to call back or seek an in-person evaluation if the symptoms worsen or if the condition fails to improve as anticipated.  I provided 30 minutes of video time during this encounter. The call started at 1100 and ended at 11:30. The patient was located at home and the provider was located office.  Subjective:   Patient ID:  [name] is a 54 y.o. (DOB Oct 05, 1966) female.  Chief Complaint:  No chief complaint on file. FU anxiety and mood.   HPI  [name] presents to the office today for follow-up of depression with seasonality and anxiety.    Last seen July 2020.  No meds were changed.  04/10/20 appt.  The following noted: Teaches online.  Difficult during Covid.  Vaccinated which helped stress over the virus.  Able to get out more.  Less anxiety.    Difficult to stay home all these months.  Good vacation helped.  Mood generally OK but Covid hard and stress of school uncertainties.  Before vacation hard to get OOB but better since then.  Not really obsessing.  Works from home.  That helps keep her on track and schedule. No worsening OCD but anxiety with Covid is some better.  Last summer hard to get OOB but OK  now.  Ok this winter so far.  Vitamin D helped.  Likes benefit lorazepam.  Only used rarely.  Otherwise usually ok.  Patient reports stable mood and denies depressed or irritable moods.  Patient denies any recent difficulty with anxiety.  Patient denies difficulty with sleep initiation or maintenance. Denies appetite disturbance.  Patient reports that energy and motivation have been good.  Patient denies any difficulty with concentration.  Patient denies any suicidal ideation.  Past Psychiatric Medication Trials:' no others.  Review of Systems:  Review of Systems  Musculoskeletal: Positive for arthralgias, back pain and gait problem.  Neurological: Negative for tremors and weakness.  Psychiatric/Behavioral: Negative for agitation, behavioral problems, confusion, decreased concentration, dysphoric mood, hallucinations, self-injury, sleep disturbance and suicidal ideas. The patient is not nervous/anxious and is not hyperactive.     Medications: I have reviewed the patient's current medications.  Current Outpatient Medications  Medication Sig Dispense Refill  . carvedilol (COREG) 25 MG tablet TAKE 1 TABLET BY MOUTH TWICE DAILY 180 tablet 3  . Cholecalciferol (VITAMIN D3) 5000 UNITS TABS Take 1 tablet by mouth daily.    . Cobalamin Combinations (B-12) [phone] MCG SUBL B12    . diphenhydramine-acetaminophen (TYLENOL PM) 25-500 MG TABS Take 1 tablet by mouth at bedtime as needed.    Marland Kitchen FLUoxetine (PROZAC) 20 MG capsule TAKE 3 CAPSULES(60 MG) BY MOUTH EVERY MORNING 270 capsule 0  . LORazepam (ATIVAN) 0.5 MG tablet TAKE 1 TABLET(0.5 MG) BY MOUTH DAILY 30  tablet 0  . Multiple Vitamin (MULTIVITAMIN WITH MINERALS) TABS tablet Take 1 tablet by mouth daily.    Marland Kitchen triamterene-hydrochlorothiazide (MAXZIDE-25) 37.5-25 MG tablet TAKE 1 TABLET BY MOUTH DAILY 90 tablet 1  . VESTURA 3-0.02 MG tablet Take 1 tablet by mouth daily.     No current facility-administered medications for this visit.     Medication Side Effects: None  Allergies:  Allergies  Allergen Reactions  . Darvocet [Propoxyphene N-Acetaminophen] Rash  . Penicillins Swelling    Past Medical History:  Diagnosis Date  . Anemia   . Anxiety   . Arthritis    knees   . Depression   . Headache    hx of migraines   . History of hiatal hernia   . Hypertension   . Morbid obesity with BMI of 60.0-69.9, adult (HCC)    BMI 62  . Murmur 08/15/2017  . Pneumonia 07/18/2011    Family History  Problem Relation Age of Onset  . Hypertension Mother   . Hypertension Father   . Cancer Father        prostate  . Diabetes Father   . Colon cancer Father   . Hypertension Brother   . Diabetes Paternal Aunt   . Stroke Paternal Aunt   . Mental illness Paternal Aunt   . Diabetes Paternal Uncle   . Breast cancer Maternal Grandmother   . Heart disease Maternal Grandfather   . Heart attack Maternal Grandfather   . Cancer Paternal Grandmother        liver    Social History   Socioeconomic History  . Marital status: Married    Spouse name: Not on file  . Number of children: Not on file  . Years of education: Not on file  . Highest education level: Not on file  Occupational History  . Not on file  Tobacco Use  . Smoking status: Never Smoker  . Smokeless tobacco: Never Used  Substance and Sexual Activity  . Alcohol use: No  . Drug use: No  . Sexual activity: Yes    Birth control/protection: Pill    Comment: azurette  Other Topics Concern  . Not on file  Social History Narrative  . Not on file   Social Determinants of Health   Financial Resource Strain:   . Difficulty of Paying Living Expenses:   Food Insecurity:   . Worried About Charity fundraiser in the Last Year:   . Arboriculturist in the Last Year:   Transportation Needs:   . Film/video editor (Medical):   Marland Kitchen Lack of Transportation (Non-Medical):   Physical Activity:   . Days of Exercise per Week:   . Minutes of Exercise per Session:    Stress:   . Feeling of Stress :   Social Connections:   . Frequency of Communication with Friends and Family:   . Frequency of Social Gatherings with Friends and Family:   . Attends Religious Services:   . Active Member of Clubs or Organizations:   . Attends Archivist Meetings:   Marland Kitchen Marital Status:   Intimate Partner Violence:   . Fear of Current or Ex-Partner:   . Emotionally Abused:   Marland Kitchen Physically Abused:   . Sexually Abused:     Past Medical History, Surgical history, Social history, and Family history were reviewed and updated as appropriate.   Please see review of systems for further details on the patient's review from today.   Objective:  Physical Exam:  There were no vitals taken for this visit.  Physical Exam Neurological:     Mental Status: She is alert and oriented to person, place, and time.     Cranial Nerves: No dysarthria.  Psychiatric:        Attention and Perception: Attention and perception normal.        Mood and Affect: Mood is anxious. Mood is not depressed.        Speech: Speech normal.        Behavior: Behavior is cooperative.        Thought Content: Thought content normal. Thought content is not paranoid or delusional. Thought content does not include homicidal or suicidal ideation. Thought content does not include homicidal or suicidal plan.        Cognition and Memory: Cognition and memory normal.        Judgment: Judgment normal.     Comments: Insight intact     Lab Review:     Component Value Date/Time   NA 140 04/12/2019 0812   K 4.0 04/12/2019 0812   CL 99 04/12/2019 0812   CO2 22 04/12/2019 0812   GLUCOSE 98 04/12/2019 0812   GLUCOSE 107 (H) 01/03/2015 0540   BUN 13 04/12/2019 0812   CREATININE 0.59 04/12/2019 0812   CREATININE 0.57 08/25/2014 0836   CALCIUM 9.4 04/12/2019 0812   PROT 6.5 04/12/2019 0812   ALBUMIN 4.3 04/12/2019 0812   AST 16 04/12/2019 0812   ALT 11 04/12/2019 0812   ALKPHOS 114 04/12/2019 0812    BILITOT 0.7 04/12/2019 0812   GFRNONAA 105 04/12/2019 0812   GFRAA 121 04/12/2019 0812       Component Value Date/Time   WBC 14.2 (H) 01/04/2015 0621   RBC 4.38 01/04/2015 0621   HGB 12.7 01/04/2015 0621   HGB 13.1 06/05/2014 0920   HCT 39.6 01/04/2015 0621   HCT 41.1 06/05/2014 0920   PLT 314 01/04/2015 0621   PLT 301 06/05/2014 0920   MCV 90.4 01/04/2015 0621   MCV 89.2 06/05/2014 0920   MCH 29.0 01/04/2015 0621   MCHC 32.1 01/04/2015 0621   RDW 14.2 01/04/2015 0621   RDW 14.3 06/05/2014 0920   LYMPHSABS 1.4 01/01/2015 0621   LYMPHSABS 1.9 06/05/2014 0920   MONOABS 1.6 (H) 01/01/2015 0621   MONOABS 0.9 06/05/2014 0920   EOSABS 0.1 01/01/2015 0621   EOSABS 0.2 06/05/2014 0920   BASOSABS 0.0 01/01/2015 0621   BASOSABS 0.1 06/05/2014 0920    No results found for: POCLITH, LITHIUM   No results found for: PHENYTOIN, PHENOBARB, VALPROATE, CBMZ   .res Assessment: Plan:    Diagnoses and all orders for this visit:  Mixed obsessional thoughts and acts  Seasonal depression (HCC)  OCD manageable.  Not much depression at present.  Tolerating meds.  Rare BZ.  No change indicated.  We discussed the short-term risks associated with benzodiazepines including sedation and increased fall risk among others.  Discussed long-term side effect risk including dependence, potential withdrawal symptoms, and the potential eventual dose-related risk of dementia.  But recent studies from 2020 dispute this association between benzodiazepines and dementia risk. Newer studies in 2020 do not support an association with dementia.  FU 6 mos  Meredith Staggers, MD, DFAPA   Please see After Visit Summary for patient specific instructions.  No future appointments.  No orders of the defined types were placed in this encounter.     -------------------------------

## 2020-05-14 ENCOUNTER — Other Ambulatory Visit: Payer: Self-pay | Admitting: Cardiovascular Disease

## 2020-08-24 DIAGNOSIS — Z1231 Encounter for screening mammogram for malignant neoplasm of breast: Secondary | ICD-10-CM | POA: Diagnosis not present

## 2020-08-24 DIAGNOSIS — Z3040 Encounter for surveillance of contraceptives, unspecified: Secondary | ICD-10-CM | POA: Diagnosis not present

## 2020-08-24 DIAGNOSIS — Z1211 Encounter for screening for malignant neoplasm of colon: Secondary | ICD-10-CM | POA: Diagnosis not present

## 2020-08-24 DIAGNOSIS — Z01419 Encounter for gynecological examination (general) (routine) without abnormal findings: Secondary | ICD-10-CM | POA: Diagnosis not present

## 2020-08-28 ENCOUNTER — Other Ambulatory Visit: Payer: Self-pay | Admitting: Obstetrics and Gynecology

## 2020-08-28 DIAGNOSIS — R928 Other abnormal and inconclusive findings on diagnostic imaging of breast: Secondary | ICD-10-CM

## 2020-09-03 DIAGNOSIS — M170 Bilateral primary osteoarthritis of knee: Secondary | ICD-10-CM | POA: Diagnosis not present

## 2020-09-11 ENCOUNTER — Ambulatory Visit
Admission: RE | Admit: 2020-09-11 | Discharge: 2020-09-11 | Disposition: A | Discharge status: Home / Self Care | Payer: Federal, State, Local not specified - PPO | Source: Ambulatory Visit | Attending: Obstetrics and Gynecology | Admitting: Obstetrics and Gynecology

## 2020-09-11 ENCOUNTER — Other Ambulatory Visit: Payer: Self-pay

## 2020-09-11 ENCOUNTER — Other Ambulatory Visit: Payer: Self-pay | Admitting: Obstetrics and Gynecology

## 2020-09-11 DIAGNOSIS — R928 Other abnormal and inconclusive findings on diagnostic imaging of breast: Secondary | ICD-10-CM

## 2020-09-11 DIAGNOSIS — N6002 Solitary cyst of left breast: Secondary | ICD-10-CM | POA: Diagnosis not present

## 2020-09-11 DIAGNOSIS — R921 Mammographic calcification found on diagnostic imaging of breast: Secondary | ICD-10-CM | POA: Diagnosis not present

## 2020-09-11 DIAGNOSIS — N6320 Unspecified lump in the left breast, unspecified quadrant: Secondary | ICD-10-CM

## 2020-09-14 ENCOUNTER — Ambulatory Visit
Admission: RE | Admit: 2020-09-14 | Discharge: 2020-09-14 | Disposition: A | Discharge status: Home / Self Care | Payer: Federal, State, Local not specified - PPO | Source: Ambulatory Visit | Attending: Obstetrics and Gynecology | Admitting: Obstetrics and Gynecology

## 2020-09-14 ENCOUNTER — Other Ambulatory Visit: Payer: Self-pay

## 2020-09-14 DIAGNOSIS — N6322 Unspecified lump in the left breast, upper inner quadrant: Secondary | ICD-10-CM | POA: Diagnosis not present

## 2020-09-14 DIAGNOSIS — N6320 Unspecified lump in the left breast, unspecified quadrant: Secondary | ICD-10-CM

## 2020-09-14 DIAGNOSIS — D1809 Hemangioma of other sites: Secondary | ICD-10-CM | POA: Diagnosis not present

## 2020-09-14 DIAGNOSIS — N6321 Unspecified lump in the left breast, upper outer quadrant: Secondary | ICD-10-CM | POA: Diagnosis not present

## 2020-09-14 HISTORY — PX: BREAST BIOPSY: SHX20

## 2020-09-26 LAB — EXTERNAL GENERIC LAB PROCEDURE: COLOGUARD: NEGATIVE

## 2020-09-26 LAB — COLOGUARD: COLOGUARD: NEGATIVE

## 2020-11-13 ENCOUNTER — Other Ambulatory Visit: Payer: Self-pay | Admitting: Cardiovascular Disease

## 2020-11-13 ENCOUNTER — Encounter: Payer: Self-pay | Admitting: Cardiovascular Disease

## 2020-11-13 ENCOUNTER — Other Ambulatory Visit: Payer: Self-pay

## 2020-11-13 ENCOUNTER — Ambulatory Visit: Payer: Federal, State, Local not specified - PPO | Admitting: Cardiovascular Disease

## 2020-11-13 VITALS — BP 130/70 | HR 60 | Ht 69.0 in | Wt >= 6400 oz

## 2020-11-13 DIAGNOSIS — I10 Essential (primary) hypertension: Secondary | ICD-10-CM | POA: Diagnosis not present

## 2020-11-13 DIAGNOSIS — Z6841 Body Mass Index (BMI) 40.0 and over, adult: Secondary | ICD-10-CM

## 2020-11-13 DIAGNOSIS — Z5181 Encounter for therapeutic drug level monitoring: Secondary | ICD-10-CM | POA: Diagnosis not present

## 2020-11-13 DIAGNOSIS — Z13220 Encounter for screening for lipoid disorders: Secondary | ICD-10-CM

## 2020-11-13 NOTE — Patient Instructions (Signed)
Medication Instructions:  Your physician recommends that you continue on your current medications as directed. Please refer to the Current Medication list given to you today.  *If you need a refill on your cardiac medications before your next appointment, please call your pharmacy*  Lab Work: FASTING LP/CMET SOON   If you have labs (blood work) drawn today and your tests are completely normal, you will receive your results only by: . MyChart Message (if you have MyChart) OR . A paper copy in the mail If you have any lab test that is abnormal or we need to change your treatment, we will call you to review the results.   Testing/Procedures: NONE  Follow-Up: At [facility], you and your health needs are our priority.  As part of our continuing mission to provide you with exceptional heart care, we have created designated Provider Care Teams.  These Care Teams include your primary Cardiologist (physician) and Advanced Practice Providers (APPs -  Physician Assistants and Nurse Practitioners) who all work together to provide you with the care you need, when you need it.  We recommend signing up for the patient portal called "MyChart".  Sign up information is provided on this After Visit Summary.  MyChart is used to connect with patients for Virtual Visits (Telemedicine).  Patients are able to view lab/test results, encounter notes, upcoming appointments, etc.  Non-urgent messages can be sent to your provider as well.   To learn more about what you can do with MyChart, go to [email or web address]://[email or web address].com.    Your next appointment:   12 month(s) You will receive a reminder letter in the mail two months in advance. If you don't receive a letter, please call our office to schedule the follow-up appointment.  The format for your next appointment:   In Person  Provider:   You may see Tiffany , MD or one of the following Advanced Practice Providers on your designated Care Team:    Luke  Kilroy, PA-C  Callie Goodrich, PA-C  Jesse Cleaver, FNP    

## 2020-11-13 NOTE — Progress Notes (Signed)
Cardiology Office Note   Date:  11/13/2020   ID:  [name], DOB [birth year], MRN [id number]  PCP:  Tamara Rainier, MD  Cardiologist:   Tamara Si, MD   No chief complaint on file.   History of Present Illness: [name] is a 53 y.o. female who presents for hypertension and morbid obesity who presents forfollow up. She was initially seen 07/2017 foran evaluation of a murmur. [name] went for her yearly exam with Dr. Estanislado Townsend 06/2017 and was noted to have a heart murmur. She has seen Dr. Estanislado Townsend for years and this was a new finding. She had an echo 08/22/17 that revealed LVEF 65-70% with grade 1 diastolic dysfunction, mildly calcified aortic valve gradients. Metoprolol was switched to carvedilol due to elevated blood pressure.HCTZ-triamterene was added and then increased by ourpharmacist 09/2017.  She recently got her third COVID-19 shot.  She had some fatigue but otherwise is feeling well.  She walks 2-3 times per week and feels good.  She has no chest pain or shortness of breath.  She plans to start yoga at work.  She denies any lower extremity edema, orthopnea, or PND.  Overall she is doing well.  She has been frustrated by the pandemic and wants to get back to normal soon.   Past Medical History:  Diagnosis Date  . Anemia   . Anxiety   . Arthritis    knees   . Depression   . Headache    hx of migraines   . History of hiatal hernia   . Hypertension   . Morbid obesity with BMI of 60.0-69.9, adult (HCC)    BMI 62  . Murmur 08/15/2017  . Pneumonia 07/18/2011    Past Surgical History:  Procedure Laterality Date  . APPENDECTOMY    . CESAREAN SECTION    . CHOLECYSTECTOMY    . LAPAROSCOPIC ROUX-EN-Y GASTRIC BYPASS WITH HIATAL HERNIA REPAIR N/A 12/30/2014   Procedure: LAPAROSCOPIC ROUX-EN-Y GASTRIC BYPASS WITH HIATAL HERNIA REPAIR AND UPPER ENDOSCOPY;  Surgeon: Tamara Ina, MD;  Location: [facility];  Service: General;  Laterality: N/A;  .  TONSILLECTOMY       Current Outpatient Medications  Medication Sig Dispense Refill  . carvedilol (COREG) 25 MG tablet TAKE 1 TABLET BY MOUTH TWICE DAILY 180 tablet 3  . Cholecalciferol (VITAMIN D3) 5000 UNITS TABS Take 1 tablet by mouth daily.    . Cobalamin Combinations (B-12) [phone] MCG SUBL B12    . diphenhydramine-acetaminophen (TYLENOL PM) 25-500 MG TABS Take 1 tablet by mouth at bedtime as needed.    Marland Kitchen FLUoxetine (PROZAC) 20 MG capsule Take 3 capsules (60 mg total) by mouth daily. 270 capsule 3  . LORazepam (ATIVAN) 0.5 MG tablet Take 1 tablet (0.5 mg total) by mouth every 8 (eight) hours as needed for anxiety. 30 tablet 3  . Multiple Vitamin (MULTIVITAMIN WITH MINERALS) TABS tablet Take 1 tablet by mouth daily.    . NORLYDA 0.35 MG tablet Take 1 tablet by mouth daily.    Marland Kitchen triamterene-hydrochlorothiazide (MAXZIDE-25) 37.5-25 MG tablet TAKE 1 TABLET BY MOUTH DAILY 90 tablet 1  . VESTURA 3-0.02 MG tablet Take 1 tablet by mouth daily.     No current facility-administered medications for this visit.    Allergies:   Darvocet [propoxyphene n-acetaminophen] and Penicillins    Social History:  The patient  reports that she has never smoked. She has never used smokeless tobacco. She reports that she does not drink alcohol and  does not use drugs.   Family History:  The patient's family history includes Breast cancer in her maternal grandmother; Cancer in her father and paternal grandmother; Colon cancer in her father; Diabetes in her father, paternal aunt, and paternal uncle; Heart attack in her maternal grandfather; Heart disease in her maternal grandfather; Hypertension in her brother, father, and mother; Mental illness in her paternal aunt; Stroke in her paternal aunt.    ROS:  Please see the history of present illness.   Otherwise, review of systems are positive for none.   All other systems are reviewed and negative.    PHYSICAL EXAM: VS:  BP 130/70 (BP Location: Left Arm, Patient  Position: Sitting)   Pulse 60   Ht 5\' 9"  (1.753 m)   Wt (!) 409 lb 3.2 oz (185.6 kg)   SpO2 96%   BMI 60.43 kg/m  , BMI Body mass index is 60.43 kg/m. GENERAL:  Well appearing HEENT: Pupils equal round and reactive, fundi not visualized, oral mucosa unremarkable NECK:  No jugular venous distention, waveform within normal limits, carotid upstroke brisk and symmetric, no bruits LUNGS:  Clear to auscultation bilaterally HEART:  RRR.  PMI not displaced or sustained,S1 and S2 within normal limits, no S3, no S4, no clicks, no rubs, no murmurs ABD:  Flat, positive bowel sounds normal in frequency in pitch, no bruits, no rebound, no guarding, no midline pulsatile mass, no hepatomegaly, no splenomegaly EXT:  2 plus pulses throughout, no edema, no cyanosis no clubbing SKIN:  No rashes no nodules NEURO:  Cranial nerves II through XII grossly intact, motor grossly intact throughout PSYCH:  Cognitively intact, oriented to person place and time    EKG:  EKG is ordered today. The ekg ordered 11/12/2019 demonstrates sinus rhythm.  Rate 70 bpm.  Right axis deviation. 11/13/2020: Sinus rhythm.  Rate 60 bpm.  RSR prime.  Echo 08/22/17:  Study Conclusions  - Left ventricle: The cavity size was normal. There was moderate concentric hypertrophy. Systolic function was vigorous. The estimated ejection fraction was in the range of 65% to 70%. Wall motion was normal; there were no regional wall motion abnormalities. Doppler parameters are consistent with abnormal left ventricular relaxation (grade 1 diastolic dysfunction). - Aortic valve: Trileaflet; mildly thickened, mildly calcified leaflets. - Mitral valve: Moderately calcified annulus. There was trivial regurgitation. - Left atrium: The atrium was moderately dilated.   Recent Labs: No results found for requested labs within last 8760 hours.    Lipid Panel    Component Value Date/Time   CHOL 171 04/12/2019 0812   TRIG 100  04/12/2019 0812   HDL 58 04/12/2019 0812   CHOLHDL 2.9 04/12/2019 0812   CHOLHDL 2.8 08/25/2014 0836   VLDL 30 08/25/2014 0836   LDLCALC 93 04/12/2019 0812      Wt Readings from Last 3 Encounters:  11/13/20 (!) 409 lb 3.2 oz (185.6 kg)  11/12/19 (!) 410 lb (186 kg)  04/17/19 (!) 390 lb (176.9 kg)      ASSESSMENT AND PLAN:  # Hypertension:BPwell-controlled on carvedilol and Maxide.  No changes.  She will come back for a CMP.  # Morbid obesity:  She was encouraged to increase her exercise to 150 minutes weekly.  LDL 93 on 03/2019.  She will come back for fasting lipids and a CMP.  #Aortic sclerosis:Aortic valve sclerosis but not stenosis.She remains asymptomatic.  Shewas advised of symptoms that warrant sooner follow-up including shortness of breath, edema,lightheadedness, dizziness, and chest pain.Repeat echo in 3-5 years.  Current medicines are reviewed at length with the patient today.  The patient does not have concerns regarding medicines.  The following changes have been made:  no change  Labs/ tests ordered today include:   Orders Placed This Encounter  Procedures  . Lipid panel  . Comprehensive metabolic panel  . EKG 12-Lead     Disposition:   FU with Tamara Buchinger C. Duke Salvia, MD, [facility] in 1 year.     Signed, Tamara Whipp C. Duke Salvia, MD, [facility]  11/13/2020 2:19 PM    [facility]

## 2020-11-26 DIAGNOSIS — J029 Acute pharyngitis, unspecified: Secondary | ICD-10-CM | POA: Diagnosis not present

## 2020-11-26 DIAGNOSIS — U071 COVID-19: Secondary | ICD-10-CM | POA: Diagnosis not present

## 2020-11-26 DIAGNOSIS — R52 Pain, unspecified: Secondary | ICD-10-CM | POA: Diagnosis not present

## 2020-11-26 DIAGNOSIS — R0981 Nasal congestion: Secondary | ICD-10-CM | POA: Diagnosis not present

## 2020-12-11 DIAGNOSIS — Z5181 Encounter for therapeutic drug level monitoring: Secondary | ICD-10-CM | POA: Diagnosis not present

## 2020-12-11 DIAGNOSIS — Z13220 Encounter for screening for lipoid disorders: Secondary | ICD-10-CM | POA: Diagnosis not present

## 2020-12-11 DIAGNOSIS — I10 Essential (primary) hypertension: Secondary | ICD-10-CM | POA: Diagnosis not present

## 2020-12-12 LAB — LIPID PANEL
Chol/HDL Ratio: 3.7 ratio (ref 0.0–4.4)
Cholesterol, Total: 202 mg/dL — ABNORMAL HIGH (ref 100–199)
HDL: 54 mg/dL (ref >39)
LDL Chol Calc (NIH): 125 mg/dL — ABNORMAL HIGH (ref 0–99)
Triglycerides: 129 mg/dL (ref 0–149)
VLDL Cholesterol Cal: 23 mg/dL (ref 5–40)

## 2020-12-12 LAB — COMPREHENSIVE METABOLIC PANEL
ALT: 12 IU/L (ref 0–32)
AST: 18 IU/L (ref 0–40)
Albumin/Globulin Ratio: 1.8 (ref 1.2–2.2)
Albumin: 4.2 g/dL (ref 3.8–4.9)
Alkaline Phosphatase: 104 IU/L (ref 44–121)
BUN/Creatinine Ratio: 21 (ref 9–23)
BUN: 17 mg/dL (ref 6–24)
Bilirubin Total: 0.7 mg/dL (ref 0.0–1.2)
CO2: 22 mmol/L (ref 20–29)
Calcium: 9.3 mg/dL (ref 8.7–10.2)
Chloride: 102 mmol/L (ref 96–106)
Creatinine, Ser: 0.81 mg/dL (ref 0.57–1.00)
GFR calc Af Amer: 95 mL/min/{1.73_m2} (ref >59)
GFR calc non Af Amer: 82 mL/min/{1.73_m2} (ref >59)
Globulin, Total: 2.3 g/dL (ref 1.5–4.5)
Glucose: 102 mg/dL — ABNORMAL HIGH (ref 65–99)
Potassium: 4.5 mmol/L (ref 3.5–5.2)
Sodium: 142 mmol/L (ref 134–144)
Total Protein: 6.5 g/dL (ref 6.0–8.5)

## 2021-01-30 ENCOUNTER — Other Ambulatory Visit: Payer: Self-pay | Admitting: Cardiovascular Disease

## 2021-02-02 DIAGNOSIS — M170 Bilateral primary osteoarthritis of knee: Secondary | ICD-10-CM | POA: Diagnosis not present

## 2021-04-30 ENCOUNTER — Other Ambulatory Visit: Payer: Self-pay | Admitting: Psychiatry

## 2021-04-30 ENCOUNTER — Other Ambulatory Visit: Payer: Self-pay | Admitting: Cardiovascular Disease

## 2021-04-30 DIAGNOSIS — F422 Mixed obsessional thoughts and acts: Secondary | ICD-10-CM

## 2021-04-30 DIAGNOSIS — F338 Other recurrent depressive disorders: Secondary | ICD-10-CM

## 2021-08-05 DIAGNOSIS — M170 Bilateral primary osteoarthritis of knee: Secondary | ICD-10-CM | POA: Diagnosis not present

## 2021-08-16 ENCOUNTER — Encounter: Payer: Self-pay | Admitting: Neurology

## 2021-09-06 DIAGNOSIS — Z3040 Encounter for surveillance of contraceptives, unspecified: Secondary | ICD-10-CM | POA: Diagnosis not present

## 2021-09-06 DIAGNOSIS — Z1231 Encounter for screening mammogram for malignant neoplasm of breast: Secondary | ICD-10-CM | POA: Diagnosis not present

## 2021-09-06 DIAGNOSIS — Z01419 Encounter for gynecological examination (general) (routine) without abnormal findings: Secondary | ICD-10-CM | POA: Diagnosis not present

## 2021-09-06 DIAGNOSIS — Z1211 Encounter for screening for malignant neoplasm of colon: Secondary | ICD-10-CM | POA: Diagnosis not present

## 2021-09-06 DIAGNOSIS — Z6841 Body Mass Index (BMI) 40.0 and over, adult: Secondary | ICD-10-CM | POA: Diagnosis not present

## 2021-09-07 ENCOUNTER — Other Ambulatory Visit: Payer: Self-pay

## 2021-09-07 DIAGNOSIS — R202 Paresthesia of skin: Secondary | ICD-10-CM

## 2021-09-09 ENCOUNTER — Ambulatory Visit: Payer: Federal, State, Local not specified - PPO | Admitting: Neurology

## 2021-09-09 ENCOUNTER — Other Ambulatory Visit: Payer: Self-pay

## 2021-09-09 DIAGNOSIS — G5603 Carpal tunnel syndrome, bilateral upper limbs: Secondary | ICD-10-CM

## 2021-09-09 DIAGNOSIS — R202 Paresthesia of skin: Secondary | ICD-10-CM | POA: Diagnosis not present

## 2021-09-09 NOTE — Procedures (Signed)
[facility]  [address] [place], [address]  [place], [place] [address] Tel: [phone] Fax:  [phone] Test Date:  09/09/2021  Patient: [name] DOB: February 23, 1966 Physician: Nita Sickle, DO  Sex: Female Height: 5\' 9"  Ref Phys: , MD  ID#: Burnell Blanks   Technician:    Patient Complaints: This is a 55 year old female furred for evaluation of bilateral hand paresthesias.  NCV & EMG Findings: Extensive electrodiagnostic testing of the right upper extremity and additional studies of the left shows:  Bilateral median sensory responses show prolonged latency (L6.3, R6.8 ms) and reduced amplitude (L6.3, R5.9 V).  Bilateral ulnar sensory responses are within normal limits. Right median motor response shows prolonged latency (5.1 ms) and reduced amplitude (5.2 mV).  Of note, there is evidence of a right Martin-Gruber anastomoses, a normal anatomic variant.  Left median motor response shows prolonged latency (4.7 ms).  Bilateral ulnar motor responses are within normal limits.   Chronic motor axonal loss changes are seen affecting bilateral abductor pollicis brevis muscles, without accompanied active denervation.  Impression: Bilateral median neuropathy at or distal to the wrist, consistent with a clinical diagnosis of carpal tunnel syndrome.  Overall, these findings are moderate-to-severe in degree electrically and worse on the right.   ___________________________ 58, DO    Nerve Conduction Studies Anti Sensory Summary Table   Stim Site NR Peak (ms) Norm Peak (ms) P-T Amp (V) Norm P-T Amp  Left Median Anti Sensory (2nd Digit)  34C  Wrist    6.3 <3.6 6.3 >15  Right Median Anti Sensory (2nd Digit)  34C  Wrist    6.8 <3.6 5.9 >15  Left Ulnar Anti Sensory (5th Digit)  34C  Wrist    2.7 <3.1 33.5 >10  Right Ulnar Anti Sensory (5th Digit)  34C  Wrist    2.5 <3.1 34.0 >10   Motor Summary Table   Stim Site NR Onset (ms) Norm Onset (ms) O-P Amp (mV)  Norm O-P Amp Site1 Site2 Delta-0 (ms) Dist (cm) Vel (m/s) Norm Vel (m/s)  Left Median Motor (Abd Poll Brev)  34C  Wrist    4.7 <4.0 7.4 >6 Elbow Wrist 5.3 31.0 58 >50  Elbow    10.0  6.8  Ulnar-wrist Elbow  0.0    Ulnar-wrist NR            Right Median Motor (Abd Poll Brev)  34C  Wrist    5.1 <4.0 5.2 >6 Elbow Wrist 3.6 29.0 81 >50  Elbow    8.7  4.2  Ulnar-crossover Elbow 5.2 0.0    Ulnar-crossover    3.5  4.8         Left Ulnar Motor (Abd Dig Minimi)  34C  Wrist    2.4 <3.1 9.1 >7 B Elbow Wrist 4.6 24.0 52 >50  B Elbow    7.0  8.0  A Elbow B Elbow 1.2 8.0 67 >50  A Elbow    8.2  7.9         Right Ulnar Motor (Abd Dig Minimi)  34C  Wrist    1.8 <3.1 10.3 >7 B Elbow Wrist 4.3 23.0 53 >50  B Elbow    6.1  9.0  A Elbow B Elbow 1.5 10.0 67 >50  A Elbow    7.6  8.7          EMG   Side Muscle Ins Act Fibs Psw Fasc Number Recrt Dur Dur. Amp Amp. Poly Poly. Comment  Right 1stDorInt  Nml Nml Nml Nml Nml Nml Nml Nml Nml Nml Nml Nml N/A  Right Abd Poll Brev Nml Nml Nml Nml 1- Rapid Some 1+ Some 1+ Some 1+ N/A  Right PronatorTeres Nml Nml Nml Nml Nml Nml Nml Nml Nml Nml Nml Nml N/A  Right Biceps Nml Nml Nml Nml Nml Nml Nml Nml Nml Nml Nml Nml N/A  Right Triceps Nml Nml Nml Nml Nml Nml Nml Nml Nml Nml Nml Nml N/A  Right Deltoid Nml Nml Nml Nml Nml Nml Nml Nml Nml Nml Nml Nml N/A  Left 1stDorInt Nml Nml Nml Nml Nml Nml Nml Nml Nml Nml Nml Nml N/A  Left PronatorTeres Nml Nml Nml Nml Nml Nml Nml Nml Nml Nml Nml Nml N/A  Left Abd Poll Brev Nml Nml Nml Nml 1- Rapid Some 1+ Some 1+ Some 1+ N/A  Left Biceps Nml Nml Nml Nml Nml Nml Nml Nml Nml Nml Nml Nml N/A      Waveforms:

## 2021-09-14 ENCOUNTER — Other Ambulatory Visit: Payer: Self-pay | Admitting: Obstetrics and Gynecology

## 2021-09-14 DIAGNOSIS — N6489 Other specified disorders of breast: Secondary | ICD-10-CM

## 2021-09-14 DIAGNOSIS — N6320 Unspecified lump in the left breast, unspecified quadrant: Secondary | ICD-10-CM

## 2021-09-15 DIAGNOSIS — K080 Exfoliation of teeth due to systemic causes: Secondary | ICD-10-CM | POA: Diagnosis not present

## 2021-10-01 ENCOUNTER — Other Ambulatory Visit: Payer: Self-pay | Admitting: Obstetrics and Gynecology

## 2021-10-01 ENCOUNTER — Ambulatory Visit
Admission: RE | Admit: 2021-10-01 | Discharge: 2021-10-01 | Disposition: A | Discharge status: Home / Self Care | Payer: Federal, State, Local not specified - PPO | Source: Ambulatory Visit | Attending: Obstetrics and Gynecology | Admitting: Obstetrics and Gynecology

## 2021-10-01 ENCOUNTER — Other Ambulatory Visit: Payer: Self-pay

## 2021-10-01 DIAGNOSIS — N6489 Other specified disorders of breast: Secondary | ICD-10-CM

## 2021-10-01 DIAGNOSIS — N6320 Unspecified lump in the left breast, unspecified quadrant: Secondary | ICD-10-CM

## 2021-10-01 DIAGNOSIS — R928 Other abnormal and inconclusive findings on diagnostic imaging of breast: Secondary | ICD-10-CM | POA: Diagnosis not present

## 2021-10-01 DIAGNOSIS — R922 Inconclusive mammogram: Secondary | ICD-10-CM | POA: Diagnosis not present

## 2021-10-27 ENCOUNTER — Other Ambulatory Visit: Payer: Self-pay | Admitting: Psychiatry

## 2021-10-27 ENCOUNTER — Other Ambulatory Visit: Payer: Self-pay | Admitting: Cardiovascular Disease

## 2021-10-27 DIAGNOSIS — F338 Other recurrent depressive disorders: Secondary | ICD-10-CM

## 2021-10-27 DIAGNOSIS — F422 Mixed obsessional thoughts and acts: Secondary | ICD-10-CM

## 2021-10-27 NOTE — Telephone Encounter (Signed)
Last seen 04/10/20 due back 04/10/21

## 2021-10-27 NOTE — Telephone Encounter (Signed)
Please schedule appt

## 2021-10-28 ENCOUNTER — Telehealth: Payer: Self-pay

## 2021-10-28 DIAGNOSIS — M170 Bilateral primary osteoarthritis of knee: Secondary | ICD-10-CM | POA: Diagnosis not present

## 2021-10-28 DIAGNOSIS — F422 Mixed obsessional thoughts and acts: Secondary | ICD-10-CM

## 2021-10-28 NOTE — Telephone Encounter (Signed)
   [facility] Pre-operative Risk Assessment    Patient Name: [name]  DOB: 02-Jun-1966 MRN: [id number]  HEARTCARE STAFF:  - IMPORTANT!!!!!! Under Visit Info/Reason for Call, type in Other and utilize the format Clearance MM/DD/YY or Clearance TBD. Do not use dashes or single digits. - Please review there is not already an duplicate clearance open for this procedure. - If request is for dental extraction, please clarify the # of teeth to be extracted. - If the patient is currently at the dentist's office, call Pre-Op Callback Staff (MA/nurse) to input urgent request.  - If the patient is not currently in the dentist office, please route to the Pre-Op pool.  Request for surgical clearance:  What type of surgery is being performed? Right Carpel Tunnel Release  When is this surgery scheduled? 11/23/21  What type of clearance is required (medical clearance vs. Pharmacy clearance to hold med vs. Both)? Medical Clearance   Are there any medications that need to be held prior to surgery and how long? None Listed   Practice name and name of physician performing surgery? [facility], Dr. Earlie Server   What is the office phone number? [phone]   7.   What is the office fax number? [phone] Attn:Kelly  8.   Anesthesia type (None, local, MAC, general) ? Not listed    Jacqulynn Cadet 10/28/2021, 3:59 PM  _________________________________________________________________   (provider comments below)

## 2021-10-29 NOTE — Telephone Encounter (Signed)
Pt has appt with Dr. Chilton Si 11/04/21 for pre op clearance. I will forward notes to MD for upcoming appt. Will send FYI to Dr. Madelon Lips pt has appt 11/04/21.

## 2021-10-29 NOTE — Telephone Encounter (Signed)
I spoke with patient regarding follow-up appt. Appt scheduled 1/25. She also inquired about getting prescription for Lorazepam states that she is out and needs some more until her appt as she only takes as needed. Pharmacy [facility] [phone] [place], [place]

## 2021-10-29 NOTE — Telephone Encounter (Signed)
Primary Cardiologist:Tiffany Duke Salvia, MD  Chart reviewed as part of pre-operative protocol coverage. Because of [name]'s past medical history and time since last visit, he/she will require a follow-up visit in order to better assess preoperative cardiovascular risk.  Pre-op covering staff: - Please schedule appointment and call patient to inform them. - Please contact requesting surgeon's office via preferred method (i.e, phone, fax) to inform them of need for appointment prior to surgery.  If applicable, this message will also be routed to pharmacy pool and/or primary cardiologist for input on holding anticoagulant/antiplatelet agent as requested below so that this information is available at time of patient's appointment.   Ronney Asters, NP  10/29/2021, 8:59 AM

## 2021-10-29 NOTE — Telephone Encounter (Signed)
Patient scheduled for 12/22/21

## 2021-10-29 NOTE — Telephone Encounter (Signed)
Left message for the pt to call back and schedule an appt for pre op clearance. Pt surgery is set for 11/23/21. I was going to offer the pt 11/04/21 with Dr. Duke Salvia at the [facility] location. If that appt is not available when the pt calls back then will just need to find another appt hopefully before her surgery date. Pt can see Dr. Duke Salvia or APP, or can be seen at [facility] or [facility] location as well if needed.

## 2021-11-02 NOTE — Telephone Encounter (Signed)
I only put in the clearance request. I think the requesting office was asking about the Ativan.

## 2021-11-04 ENCOUNTER — Other Ambulatory Visit: Payer: Self-pay

## 2021-11-04 ENCOUNTER — Ambulatory Visit (HOSPITAL_BASED_OUTPATIENT_CLINIC_OR_DEPARTMENT_OTHER): Payer: Federal, State, Local not specified - PPO | Admitting: Cardiovascular Disease

## 2021-11-04 ENCOUNTER — Encounter (HOSPITAL_BASED_OUTPATIENT_CLINIC_OR_DEPARTMENT_OTHER): Payer: Self-pay | Admitting: *Deleted

## 2021-11-04 ENCOUNTER — Encounter (HOSPITAL_BASED_OUTPATIENT_CLINIC_OR_DEPARTMENT_OTHER): Payer: Self-pay | Admitting: Cardiovascular Disease

## 2021-11-04 VITALS — BP 126/70 | HR 60 | Ht 69.0 in | Wt >= 6400 oz

## 2021-11-04 DIAGNOSIS — Z01818 Encounter for other preprocedural examination: Secondary | ICD-10-CM | POA: Diagnosis not present

## 2021-11-04 DIAGNOSIS — Z6841 Body Mass Index (BMI) 40.0 and over, adult: Secondary | ICD-10-CM

## 2021-11-04 DIAGNOSIS — I350 Nonrheumatic aortic (valve) stenosis: Secondary | ICD-10-CM

## 2021-11-04 DIAGNOSIS — I358 Other nonrheumatic aortic valve disorders: Secondary | ICD-10-CM

## 2021-11-04 DIAGNOSIS — I10 Essential (primary) hypertension: Secondary | ICD-10-CM

## 2021-11-04 HISTORY — DX: Encounter for other preprocedural examination: Z01.818

## 2021-11-04 HISTORY — DX: Nonrheumatic aortic (valve) stenosis: I35.0

## 2021-11-04 NOTE — Assessment & Plan Note (Signed)
Continue to work on diet and exercise

## 2021-11-04 NOTE — Assessment & Plan Note (Signed)
Mean gradient was 10 mmHg in 2018.  It still sounds mild on exam.  We will repeat her echo to reassess.  She does not have any heart failure symptoms.

## 2021-11-04 NOTE — Patient Instructions (Addendum)
Medication Instructions:  Your physician recommends that you continue on your current medications as directed. Please refer to the Current Medication list given to you today.  *If you need a refill on your cardiac medications before your next appointment, please call your pharmacy*   Lab Work: FASTING LP/CMET SOON   If you have labs (blood work) drawn today and your tests are completely normal, you will receive your results only by: MyChart Message (if you have MyChart) OR A paper copy in the mail If you have any lab test that is abnormal or we need to change your treatment, we will call you to review the results.   Testing/Procedures: Your physician has requested that you have an echocardiogram. Echocardiography is a painless test that uses sound waves to create images of your heart. It provides your doctor with information about the size and shape of your heart and how well your heart's chambers and valves are working. This procedure takes approximately one hour. There are no restrictions for this procedure.  YOU DO NOT NEED THIS FOR CARDIAC CLEARANCE   Follow-Up: At [facility], you and your health needs are our priority.  As part of our continuing mission to provide you with exceptional heart care, we have created designated Provider Care Teams.  These Care Teams include your primary Cardiologist (physician) and Advanced Practice Providers (APPs -  Physician Assistants and Nurse Practitioners) who all work together to provide you with the care you need, when you need it.  We recommend signing up for the patient portal called "MyChart".  Sign up information is provided on this After Visit Summary.  MyChart is used to connect with patients for Virtual Visits (Telemedicine).  Patients are able to view lab/test results, encounter notes, upcoming appointments, etc.  Non-urgent messages can be sent to your provider as well.   To learn more about what you can do with MyChart, go to  [email or web address].    Your next appointment:   12 month(s)  The format for your next appointment:   In Person  Provider:   Chilton Si, MD   Other Instructions  YOU ARE CLEARED FROM CARDIAC STANDPOINT FOR YOU CARPAL TUNNEL PROCEDURE

## 2021-11-04 NOTE — Assessment & Plan Note (Signed)
[name] does not have any unstable cardiac conditions.  She is at low cardiac risk for a low risk carpel tunnel surgery under conscious sedation.  Her largest surgical risk is morbid obesity and respiratory status.

## 2021-11-04 NOTE — Assessment & Plan Note (Signed)
Blood pressure is well-controlled on carvedilol and HCTZ/triamterene.

## 2021-11-04 NOTE — Progress Notes (Signed)
Cardiology Office Note  Date:  11/04/2021   ID:  [name], DOB [birth year], MRN [id number]  PCP:  Tamara Soho, PA-C  Cardiologist:   Chilton Si, MD   No chief complaint on file.  History of Present Illness: [name] is a 55 y.o. female with a hx of hypertension, and morbid obesity who presents for follow up.  She was initially seen 07/2017 for an evaluation of a murmur.  [name] went for her yearly exam with [name] 06/2017 and was noted to have a heart murmur.  She has seen [name] for years and this was a new finding.  She had an echo 08/22/17 that revealed LVEF 65-70% with grade 1 diastolic dysfunction, mildly calcified aortic valve gradients.  Metoprolol was switched to carvedilol due to elevated blood pressure.  HCTZ-triamterene was added and then increased by our pharmacist 09/2017.   Today, she needs pre-op clearance for right carpal tunnel release scheduled on 11/23/21. Overall, she appears well and is excited for her daughter who is applying for college. Last year she was infected with COVID just after Christmas. Every once in a while she experiences shortness of breath that she attributes to COVID. This only occurs when she is very fatigued, mostly at night before bed. Also, she reports suffering a third degree burn while cooking for Thanksgiving. She denies any palpitations, or chest pain. No lightheadedness, headaches, syncope, orthopnea, PND, or lower extremity edema.   Past Medical History:  Diagnosis Date   Anemia    Anxiety    Aortic stenosis 11/04/2021   Arthritis    knees    Depression    Headache    hx of migraines    History of hiatal hernia    Hypertension    Morbid obesity with BMI of 60.0-69.9, adult (HCC)    BMI 62   Murmur 08/15/2017   Pneumonia 07/18/2011   Preoperative evaluation of a medical condition to rule out surgical contraindications (TAR required) 11/04/2021    Past Surgical History:  Procedure Laterality Date    APPENDECTOMY     BREAST BIOPSY Left 09/14/2020   CESAREAN SECTION     CHOLECYSTECTOMY     LAPAROSCOPIC ROUX-EN-Y GASTRIC BYPASS WITH HIATAL HERNIA REPAIR N/A 12/30/2014   Procedure: LAPAROSCOPIC ROUX-EN-Y GASTRIC BYPASS WITH HIATAL HERNIA REPAIR AND UPPER ENDOSCOPY;  Surgeon: [name], MD;  Location: [facility];  Service: General;  Laterality: N/A;   TONSILLECTOMY       Current Outpatient Medications  Medication Sig Dispense Refill   carvedilol (COREG) 25 MG tablet TAKE 1 TABLET BY MOUTH TWICE DAILY 180 tablet 3   Cholecalciferol (VITAMIN D3) 5000 UNITS TABS Take 1 tablet by mouth daily.     Cobalamin Combinations (B-12) [phone] MCG SUBL B12     diphenhydramine-acetaminophen (TYLENOL PM) 25-500 MG TABS Take 1 tablet by mouth at bedtime as needed.     FLUoxetine (PROZAC) 20 MG capsule TAKE 3 CAPSULES(60 MG) BY MOUTH DAILY 270 capsule 0   LORazepam (ATIVAN) 0.5 MG tablet Take 1 tablet (0.5 mg total) by mouth every 8 (eight) hours as needed for anxiety. 30 tablet 3   Multiple Vitamin (MULTIVITAMIN WITH MINERALS) TABS tablet Take 1 tablet by mouth daily.     NORLYDA 0.35 MG tablet Take 1 tablet by mouth daily.     triamterene-hydrochlorothiazide (MAXZIDE-25) 37.5-25 MG tablet Take 1 tablet by mouth daily. Pt. Is due for follow up. Please contact our office to set up appointment 90  tablet 1   VESTURA 3-0.02 MG tablet Take 1 tablet by mouth daily.     No current facility-administered medications for this visit.    Allergies:   Darvocet [propoxyphene n-acetaminophen] and Penicillins    Social History:  The patient  reports that she has never smoked. She has never used smokeless tobacco. She reports that she does not drink alcohol and does not use drugs.   Family History:  The patient's family history includes Breast cancer in her maternal grandmother; Cancer in her father and paternal grandmother; Colon cancer in her father; Diabetes in her father, paternal aunt, and paternal uncle; Heart  attack in her maternal grandfather; Heart disease in her maternal grandfather; Hypertension in her brother, father, and mother; Mental illness in her paternal aunt; Stroke in her paternal aunt.   ROS:   Please see the history of present illness. (+) Shortness of breath (+) Fatigue All other systems are reviewed and negative.   PHYSICAL EXAM: VS:  BP 126/70 (BP Location: Left Arm, Patient Position: Sitting, Cuff Size: Large)   Pulse 60   Ht 5\' 9"  (1.753 m)   Wt (!) 420 lb 14.4 oz (190.9 kg)   BMI 62.16 kg/m  , BMI Body mass index is 62.16 kg/m. GENERAL:  Well appearing HEENT: Pupils equal round and reactive, fundi not visualized, oral mucosa unremarkable NECK:  No jugular venous distention, waveform within normal limits, carotid upstroke brisk and symmetric, no bruits LUNGS:  Clear to auscultation bilaterally HEART:  RRR.  PMI not displaced or sustained,S1 and S2 within normal limits, no S3, no S4, no clicks, no rubs, no murmurs ABD:  Flat, positive bowel sounds normal in frequency in pitch, no bruits, no rebound, no guarding, no midline pulsatile mass, no hepatomegaly, no splenomegaly EXT:  2 plus pulses throughout, no edema, no cyanosis no clubbing SKIN:  No rashes no nodules NEURO:  Cranial nerves II through XII grossly intact, motor grossly intact throughout PSYCH:  Cognitively intact, oriented to person place and time  EKG:   11/04/2021: Sinus rhythm. Rate 60 bpm. PACs. Incomplete RBBB. 11/13/2020: Sinus rhythm.  Rate 60 bpm. RSR prime. 11/12/2019: sinus rhythm.  Rate 70 bpm.  Right axis deviation.  Echo 08/22/17:  Study Conclusions - Left ventricle: The cavity size was normal. There was moderate   concentric hypertrophy. Systolic function was vigorous. The   estimated ejection fraction was in the range of 65% to 70%. Wall   motion was normal; there were no regional wall motion   abnormalities. Doppler parameters are consistent with abnormal   left ventricular relaxation  (grade 1 diastolic dysfunction). - Aortic valve: Trileaflet; mildly thickened, mildly calcified   leaflets. - Mitral valve: Moderately calcified annulus. There was trivial   regurgitation. - Left atrium: The atrium was moderately dilated.    Recent Labs: 12/11/2020: ALT 12; BUN 17; Creatinine, Ser 0.81; Potassium 4.5; Sodium 142   Lipid Panel    Component Value Date/Time   CHOL 202 (H) 12/11/2020 1122   TRIG 129 12/11/2020 1122   HDL 54 12/11/2020 1122   CHOLHDL 3.7 12/11/2020 1122   CHOLHDL 2.8 08/25/2014 0836   VLDL 30 08/25/2014 0836   LDLCALC 125 (H) 12/11/2020 1122    Wt Readings from Last 3 Encounters:  11/04/21 (!) 420 lb 14.4 oz (190.9 kg)  11/13/20 (!) 409 lb 3.2 oz (185.6 kg)  11/12/19 (!) 410 lb (186 kg)     ASSESSMENT AND PLAN:  HTN (hypertension) Blood pressure is well-controlled on carvedilol  and HCTZ/triamterene.  Morbid obesity with BMI of 60.0-69.9, adult Continue to work on diet and exercise.  Preoperative evaluation of a medical condition to rule out surgical contraindications (TAR required) [name] does not have any unstable cardiac conditions.  She is at low cardiac risk for a low risk carpel tunnel surgery under conscious sedation.  Her largest surgical risk is morbid obesity and respiratory status.    Aortic stenosis Mean gradient was 10 mmHg in 2018.  It still sounds mild on exam.  We will repeat her echo to reassess.  She does not have any heart failure symptoms.   Current medicines are reviewed at length with the patient today.  The patient does not have concerns regarding medicines.  The following changes have been made:  no change  Labs/ tests ordered today include:   Orders Placed This Encounter  Procedures   Lipid panel   Comprehensive metabolic panel   EKG 12-Lead   ECHOCARDIOGRAM COMPLETE      Disposition:   FU with Christin Moline C. Duke Salvia, MD, [facility] in 1 year.   I,Mathew Stumpf,acting as a Neurosurgeon for [employer], MD.,have  documented all relevant documentation on the behalf of Chilton Si, MD,as directed by  Chilton Si, MD while in the presence of Chilton Si, MD.  VS:  BP 126/70 (BP Location: Left Arm, Patient Position: Sitting, Cuff Size: Large)   Pulse 60   Ht 5\' 9"  (1.753 m)   Wt (!) 420 lb 14.4 oz (190.9 kg)   BMI 62.16 kg/m  , BMI Body mass index is 62.16 kg/m. GENERAL:  Well appearing HEENT: Pupils equal round and reactive, fundi not visualized, oral mucosa unremarkable NECK:  No jugular venous distention, waveform within normal limits, carotid upstroke brisk and symmetric, no bruits, no thyromegaly LUNGS:  Clear to auscultation bilaterally HEART:  RRR.  PMI not displaced or sustained,S1 and S2 within normal limits, no S3, no S4, no clicks, no rubs, II/VI early-peaking systolic murmur att he LUSB ABD:  Flat, positive bowel sounds normal in frequency in pitch, no bruits, no rebound, no guarding, no midline pulsatile mass, no hepatomegaly, no splenomegaly EXT:  2 plus pulses throughout, no edema, no cyanosis no clubbing SKIN:  No rashes no nodules NEURO:  Cranial nerves II through XII grossly intact, motor grossly intact throughout PSYCH:  Cognitively intact, oriented to person place and time   Signed, Sheilla Maris C. , MD, [facility]  11/04/2021 1:03 PM    [facility]

## 2021-11-11 ENCOUNTER — Ambulatory Visit: Payer: Self-pay | Admitting: Physician Assistant

## 2021-11-11 NOTE — H&P (Signed)
[name] is an 55 y.o. female.   Chief Complaint: right hand and wrist pain HPI: .  Bilateral hand pain.  Equivocally positive Tinel's and Phalen's.  Bothersome symptoms suggestive of probable carpal tunnel.  NCV reveals she has significant carpal tunnel, moderate to severe on the right.  Past Medical History:  Diagnosis Date   Anemia    Anxiety    Aortic stenosis 11/04/2021   Arthritis    knees    Depression    Headache    hx of migraines    History of hiatal hernia    Hypertension    Morbid obesity with BMI of 60.0-69.9, adult (HCC)    BMI 62   Murmur 08/15/2017   Pneumonia 07/18/2011   Preoperative evaluation of a medical condition to rule out surgical contraindications (TAR required) 11/04/2021    Past Surgical History:  Procedure Laterality Date   APPENDECTOMY     BREAST BIOPSY Left 09/14/2020   CESAREAN SECTION     CHOLECYSTECTOMY     LAPAROSCOPIC ROUX-EN-Y GASTRIC BYPASS WITH HIATAL HERNIA REPAIR N/A 12/30/2014   Procedure: LAPAROSCOPIC ROUX-EN-Y GASTRIC BYPASS WITH HIATAL HERNIA REPAIR AND UPPER ENDOSCOPY;  Surgeon: [name], MD;  Location: [facility];  Service: General;  Laterality: N/A;   TONSILLECTOMY      Family History  Problem Relation Age of Onset   Hypertension Mother    Hypertension Father    Cancer Father        prostate   Diabetes Father    Colon cancer Father    Hypertension Brother    Diabetes Paternal Aunt    Stroke Paternal Aunt    Mental illness Paternal Aunt    Diabetes Paternal Uncle    Breast cancer Maternal Grandmother    Heart disease Maternal Grandfather    Heart attack Maternal Grandfather    Cancer Paternal Grandmother        liver   Social History:  reports that she has never smoked. She has never used smokeless tobacco. She reports that she does not drink alcohol and does not use drugs.  Allergies:  Allergies  Allergen Reactions   Darvocet [Propoxyphene N-Acetaminophen] Rash   Penicillins Swelling    (Not in a  hospital admission)   No results found for this or any previous visit (from the past 48 hour(s)). No results found.  Review of Systems  Neurological:  Positive for weakness and numbness.  All other systems reviewed and are negative.  There were no vitals taken for this visit. Physical Exam Constitutional:      Appearance: Normal appearance.  HENT:     Head: Normocephalic and atraumatic.  Eyes:     Extraocular Movements: Extraocular movements intact.     Pupils: Pupils are equal, round, and reactive to light.  Cardiovascular:     Rate and Rhythm: Normal rate.     Pulses: Normal pulses.  Pulmonary:     Effort: Pulmonary effort is normal. No respiratory distress.  Abdominal:     General: There is no distension.  Musculoskeletal:     Cervical back: Normal range of motion.     Comments: Bilateral hand pain.  Equivocally positive Tinel's and Phalen's.   Skin:    General: Skin is warm and dry.     Findings: No erythema or rash.  Neurological:     General: No focal deficit present.     Mental Status: She is alert and oriented to person, place, and time.  Psychiatric:  Mood and Affect: Mood normal.        Behavior: Behavior normal.     Assessment/Plan significant carpal tunnel, moderate to severe on the right.  Very symptomatic.  With her BMI this would have to be done in the hospital.  Risks and benefits of right carpal tunnel release discussed with the patient.  Potentially proceed with scheduling at some point in the foreseeable future.  Margart Sickles, PA-C 11/11/2021, 3:10 PM

## 2021-11-11 NOTE — H&P (View-Only) (Signed)
[name] is an 55 y.o. female.   Chief Complaint: right hand and wrist pain HPI: .  Bilateral hand pain.  Equivocally positive Tinel's and Phalen's.  Bothersome symptoms suggestive of probable carpal tunnel.  NCV reveals she has significant carpal tunnel, moderate to severe on the right.  Past Medical History:  Diagnosis Date   Anemia    Anxiety    Aortic stenosis 11/04/2021   Arthritis    knees    Depression    Headache    hx of migraines    History of hiatal hernia    Hypertension    Morbid obesity with BMI of 60.0-69.9, adult (HCC)    BMI 62   Murmur 08/15/2017   Pneumonia 07/18/2011   Preoperative evaluation of a medical condition to rule out surgical contraindications (TAR required) 11/04/2021    Past Surgical History:  Procedure Laterality Date   APPENDECTOMY     BREAST BIOPSY Left 09/14/2020   CESAREAN SECTION     CHOLECYSTECTOMY     LAPAROSCOPIC ROUX-EN-Y GASTRIC BYPASS WITH HIATAL HERNIA REPAIR N/A 12/30/2014   Procedure: LAPAROSCOPIC ROUX-EN-Y GASTRIC BYPASS WITH HIATAL HERNIA REPAIR AND UPPER ENDOSCOPY;  Surgeon: [name], MD;  Location: [facility];  Service: General;  Laterality: N/A;   TONSILLECTOMY      Family History  Problem Relation Age of Onset   Hypertension Mother    Hypertension Father    Cancer Father        prostate   Diabetes Father    Colon cancer Father    Hypertension Brother    Diabetes Paternal Aunt    Stroke Paternal Aunt    Mental illness Paternal Aunt    Diabetes Paternal Uncle    Breast cancer Maternal Grandmother    Heart disease Maternal Grandfather    Heart attack Maternal Grandfather    Cancer Paternal Grandmother        liver   Social History:  reports that she has never smoked. She has never used smokeless tobacco. She reports that she does not drink alcohol and does not use drugs.  Allergies:  Allergies  Allergen Reactions   Darvocet [Propoxyphene N-Acetaminophen] Rash   Penicillins Swelling    (Not in a  hospital admission)   No results found for this or any previous visit (from the past 48 hour(s)). No results found.  Review of Systems  Neurological:  Positive for weakness and numbness.  All other systems reviewed and are negative.  There were no vitals taken for this visit. Physical Exam Constitutional:      Appearance: Normal appearance.  HENT:     Head: Normocephalic and atraumatic.  Eyes:     Extraocular Movements: Extraocular movements intact.     Pupils: Pupils are equal, round, and reactive to light.  Cardiovascular:     Rate and Rhythm: Normal rate.     Pulses: Normal pulses.  Pulmonary:     Effort: Pulmonary effort is normal. No respiratory distress.  Abdominal:     General: There is no distension.  Musculoskeletal:     Cervical back: Normal range of motion.     Comments: Bilateral hand pain.  Equivocally positive Tinel's and Phalen's.   Skin:    General: Skin is warm and dry.     Findings: No erythema or rash.  Neurological:     General: No focal deficit present.     Mental Status: She is alert and oriented to person, place, and time.  Psychiatric:  Mood and Affect: Mood normal.        Behavior: Behavior normal.     Assessment/Plan significant carpal tunnel, moderate to severe on the right.  Very symptomatic.  With her BMI this would have to be done in the hospital.  Risks and benefits of right carpal tunnel release discussed with the patient.  Potentially proceed with scheduling at some point in the foreseeable future.  Margart Sickles, PA-C 11/11/2021, 3:10 PM

## 2021-11-14 ENCOUNTER — Other Ambulatory Visit: Payer: Self-pay | Admitting: Psychiatry

## 2021-11-14 DIAGNOSIS — F422 Mixed obsessional thoughts and acts: Secondary | ICD-10-CM

## 2021-11-16 NOTE — Progress Notes (Addendum)
Surgical Instructions    Your procedure is scheduled on Tuesday, December 27th.  Report to [facility] Main Entrance "A" at 5:30 A.M., then check in with the Admitting office.  Call this number if you have problems the morning of surgery:  [phone]   If you have any questions prior to your surgery date call [phone]: Open Monday-Friday 8am-4pm    Remember:  Do not eat after midnight the night before your surgery  You may drink clear liquids until 4:30 AM the morning of your surgery.   Clear liquids allowed are: Water, Non-Citrus Juices (without pulp), Carbonated Beverages, Clear Tea, Black Coffee ONLY (NO MILK, CREAM OR POWDERED CREAMER of any kind), and Gatorade  Please complete your PRE-SURGERY ENSURE that was provided to you by 4:30 AM the morning of surgery.  Please, if able, drink it in one sitting. DO NOT SIP. Nothing else to drink after you finish the Ensure.      Take these medicines the morning of surgery with A SIP OF WATER Carvedilol (Coreg) Fluoxetine (Prozac)  If needed: Lorazepam (Ativan)    As of today, STOP taking any Aspirin (unless otherwise instructed by your surgeon) Aleve, Naproxen, Ibuprofen, Motrin, Advil, Goody's, BC's, all herbal medications, fish oil, and all vitamins.   DAY OF SURGERY:         Do not wear jewelry or makeup Do not wear lotions, powders, perfumes, or deodorant. Do not shave 48 hours prior to surgery.   Do not bring valuables to the hospital. DO Not wear nail polish, gel polish, artificial nails, or any other type of covering on natural nails including finger and toenails. If patients have artificial nails, gel coating, etc. that need to be removed by a nail salon, please have this removed prior to surgery or surgery may need to be canceled/delayed if the surgeon/ anesthesia feels like the patient is unable to be adequately monitored.             [facility] is not responsible for any belongings or valuables.  Do NOT Smoke  (Tobacco/Vaping)  24 hours prior to your procedure  If you use a CPAP at night, you may bring your mask for your overnight stay.   Contacts, glasses, hearing aids, dentures or partials may not be worn into surgery, please bring cases for these belongings   For patients admitted to the hospital, discharge time will be determined by your treatment team.   Patients discharged the day of surgery will not be allowed to drive home, and someone needs to stay with them for 24 hours.  NO VISITORS WILL BE ALLOWED IN PRE-OP WHERE PATIENTS ARE PREPPED FOR SURGERY.  ONLY 1 SUPPORT PERSON MAY BE PRESENT IN THE WAITING ROOM WHILE YOU ARE IN SURGERY.  IF YOU ARE TO BE ADMITTED, ONCE YOU ARE IN YOUR ROOM YOU WILL BE ALLOWED TWO (2) VISITORS. 1 (ONE) VISITOR MAY STAY OVERNIGHT BUT MUST ARRIVE TO THE ROOM BY 8pm.  Minor children may have two parents present. Special consideration for safety and communication needs will be reviewed on a case by case basis.  Special instructions:    Oral Hygiene is also important to reduce your risk of infection.  Remember - BRUSH YOUR TEETH THE MORNING OF SURGERY WITH YOUR REGULAR TOOTHPASTE   [place]- Preparing For Surgery  Before surgery, you can play an important role. Because skin is not sterile, your skin needs to be as free of germs as possible. You can reduce the number of  germs on your skin by washing with CHG (chlorahexidine gluconate) Soap before surgery.  CHG is an antiseptic cleaner which kills germs and bonds with the skin to continue killing germs even after washing.     Please do not use if you have an allergy to CHG or antibacterial soaps. If your skin becomes reddened/irritated stop using the CHG.  Do not shave (including legs and underarms) for at least 48 hours prior to first CHG shower. It is OK to shave your face.  Please follow these instructions carefully.     Shower the NIGHT BEFORE SURGERY and the MORNING OF SURGERY with CHG Soap.   If you chose  to wash your hair, wash your hair first as usual with your normal shampoo. After you shampoo, rinse your hair and body thoroughly to remove the shampoo.  Then [employer] and genitals (private parts) with your normal soap and rinse thoroughly to remove soap.  After that Use CHG Soap as you would any other liquid soap. You can apply CHG directly to the skin and wash gently with a scrungie or a clean washcloth.   Apply the CHG Soap to your body ONLY FROM THE NECK DOWN.  Do not use on open wounds or open sores. Avoid contact with your eyes, ears, mouth and genitals (private parts). Wash Face and genitals (private parts)  with your normal soap.   Wash thoroughly, paying special attention to the area where your surgery will be performed.  Thoroughly rinse your body with warm water from the neck down.  DO NOT shower/wash with your normal soap after using and rinsing off the CHG Soap.  Pat yourself dry with a CLEAN TOWEL.  Wear CLEAN PAJAMAS to bed the night before surgery  Place CLEAN SHEETS on your bed the night before your surgery  DO NOT SLEEP WITH PETS.   Day of Surgery:  Take a shower with CHG soap. Wear Clean/Comfortable clothing the morning of surgery Do not apply any deodorants/lotions.   Remember to brush your teeth WITH YOUR REGULAR TOOTHPASTE.   Please read over the following fact sheets that you were given.

## 2021-11-17 ENCOUNTER — Encounter (HOSPITAL_COMMUNITY)
Admission: RE | Admit: 2021-11-17 | Discharge: 2021-11-17 | Disposition: A | Discharge status: Home / Self Care | Payer: Federal, State, Local not specified - PPO | Source: Ambulatory Visit | Attending: Orthopedic Surgery | Admitting: Orthopedic Surgery

## 2021-11-17 ENCOUNTER — Encounter (HOSPITAL_COMMUNITY): Payer: Self-pay

## 2021-11-17 ENCOUNTER — Other Ambulatory Visit: Payer: Self-pay

## 2021-11-17 VITALS — BP 162/68 | HR 69 | Temp 98.7°F | Resp 20 | Ht 69.0 in | Wt >= 6400 oz

## 2021-11-17 DIAGNOSIS — Z6841 Body Mass Index (BMI) 40.0 and over, adult: Secondary | ICD-10-CM | POA: Diagnosis not present

## 2021-11-17 DIAGNOSIS — I10 Essential (primary) hypertension: Secondary | ICD-10-CM | POA: Insufficient documentation

## 2021-11-17 DIAGNOSIS — Z01818 Encounter for other preprocedural examination: Secondary | ICD-10-CM

## 2021-11-17 DIAGNOSIS — Z01812 Encounter for preprocedural laboratory examination: Secondary | ICD-10-CM | POA: Diagnosis not present

## 2021-11-17 LAB — COMPREHENSIVE METABOLIC PANEL
ALT: 17 U/L (ref 0–44)
AST: 19 U/L (ref 15–41)
Albumin: 3.6 g/dL (ref 3.5–5.0)
Alkaline Phosphatase: 87 U/L (ref 38–126)
Anion gap: 9 (ref 5–15)
BUN: 17 mg/dL (ref 6–20)
CO2: 29 mmol/L (ref 22–32)
Calcium: 9 mg/dL (ref 8.9–10.3)
Chloride: 102 mmol/L (ref 98–111)
Creatinine, Ser: 0.73 mg/dL (ref 0.44–1.00)
GFR, Estimated: >60 mL/min (ref >60)
Glucose, Bld: 102 mg/dL — ABNORMAL HIGH (ref 70–99)
Potassium: 3.6 mmol/L (ref 3.5–5.1)
Sodium: 140 mmol/L (ref 135–145)
Total Bilirubin: 1 mg/dL (ref 0.3–1.2)
Total Protein: 6.3 g/dL — ABNORMAL LOW (ref 6.5–8.1)

## 2021-11-17 LAB — LIPID PANEL
Cholesterol: 181 mg/dL (ref 0–200)
HDL: 60 mg/dL (ref >40)
LDL Cholesterol: 105 mg/dL — ABNORMAL HIGH (ref 0–99)
Total CHOL/HDL Ratio: 3 RATIO
Triglycerides: 79 mg/dL (ref <150)
VLDL: 16 mg/dL (ref 0–40)

## 2021-11-17 LAB — CBC
HCT: 45.4 % (ref 36.0–46.0)
Hemoglobin: 13.9 g/dL (ref 12.0–15.0)
MCH: 27.7 pg (ref 26.0–34.0)
MCHC: 30.6 g/dL (ref 30.0–36.0)
MCV: 90.4 fL (ref 80.0–100.0)
Platelets: 267 10*3/uL (ref 150–400)
RBC: 5.02 MIL/uL (ref 3.87–5.11)
RDW: 15.3 % (ref 11.5–15.5)
WBC: 11 10*3/uL — ABNORMAL HIGH (ref 4.0–10.5)
nRBC: 0 % (ref 0.0–0.2)

## 2021-11-17 NOTE — Progress Notes (Signed)
PCP - Jarrett Soho, PA Cardiologist - Chilton Si  PPM/ICD - n/a Device Orders -  Rep Notified -   Chest x-ray - n/a EKG - 11/04/21 Stress Test - n/a ECHO - 11/04/21 Cardiac Cath - n/a  Sleep Study - n/a CPAP -   Fasting Blood Sugar - n/a Checks Blood Sugar _____ times a day  Blood Thinner Instructions: Aspirin Instructions:  ERAS Protcol -Clears until 430 PRE-SURGERY Ensure or G2- ensure provided  COVID TEST- n/a, Ambulatory surgery   Anesthesia review: yes, BMI 62  Patient denies shortness of breath, fever, cough and chest pain at PAT appointment   All instructions explained to the patient, with a verbal understanding of the material. Patient agrees to go over the instructions while at home for a better understanding. Patient also instructed to self quarantine after being tested for COVID-19. The opportunity to ask questions was provided.

## 2021-11-18 NOTE — Anesthesia Preprocedure Evaluation (Addendum)
Anesthesia Evaluation  Patient identified by MRN, date of birth, ID band Patient awake    Reviewed: Allergy & Precautions, NPO status , Patient's Chart, lab work & pertinent test results, reviewed documented beta blocker date and time   Airway Mallampati: III  TM Distance: >3 FB Neck ROM: Full    Dental no notable dental hx. (+) Teeth Intact, Dental Advisory Given   Pulmonary pneumonia, resolved,    Pulmonary exam normal breath sounds clear to auscultation       Cardiovascular hypertension, Pt. on home beta blockers and Pt. on medications Normal cardiovascular exam+ Valvular Problems/Murmurs AS  Rhythm:Regular Rate:Normal  EKG 11/04/2021: Sinus rhythm with premature atrial complexes.  Rate 60.  Incomplete right bundle branch block.  TTE 08/22/2017: - Left ventricle: The cavity size was normal. There was moderate  concentric hypertrophy. Systolic function was vigorous. The  estimated ejection fraction was in the range of 65% to 70%. Wall  motion was normal; there were no regional wall motion  abnormalities. Doppler parameters are consistent with abnormal  left ventricular relaxation (grade 1 diastolic dysfunction).  - Aortic valve: Trileaflet; mildly thickened, mildly calcified  leaflets.  - Mitral valve: Moderately calcified annulus. There was trivial  regurgitation.  - Left atrium: The atrium was moderately dilated   Neuro/Psych  Headaches, PSYCHIATRIC DISORDERS Anxiety Depression    GI/Hepatic Neg liver ROS, hiatal hernia,   Endo/Other  Morbid obesity (BMI 62)  Renal/GU negative Renal ROS  negative genitourinary   Musculoskeletal negative musculoskeletal ROS (+)   Abdominal   Peds  Hematology negative hematology ROS (+)   Anesthesia Other Findings   Reproductive/Obstetrics                           Anesthesia Physical Anesthesia Plan  ASA: 3  Anesthesia Plan: MAC    Post-op Pain Management: Tylenol PO (pre-op)   Induction: Intravenous  PONV Risk Score and Plan: 2 and Propofol infusion, Treatment may vary due to age or medical condition and Midazolam  Airway Management Planned: Natural Airway  Additional Equipment:   Intra-op Plan:   Post-operative Plan:   Informed Consent: I have reviewed the patients History and Physical, chart, labs and discussed the procedure including the risks, benefits and alternatives for the proposed anesthesia with the patient or authorized representative who has indicated his/her understanding and acceptance.     Dental advisory given  Plan Discussed with: CRNA  Anesthesia Plan Comments:        Anesthesia Quick Evaluation

## 2021-11-18 NOTE — Progress Notes (Signed)
Anesthesia Chart Review:  Patient follows with cardiology for history of mild AS (mean gradient 10 mmHg by echo 2018), HTN, morbid obesity (BMI 62).  She was seen by Dr. Duke Salvia 11/04/2021 for preop evaluation. Per note, "[name] does not have any unstable cardiac conditions.  She is at low cardiac risk for a low risk carpel tunnel surgery under conscious sedation.  Her largest surgical risk is morbid obesity and respiratory status."  Preop labs reviewed, unremarkable.  EKG 11/04/2021: Sinus rhythm with premature atrial complexes.  Rate 60.  Incomplete right bundle branch block.  TTE 08/22/2017: - Left ventricle: The cavity size was normal. There was moderate    concentric hypertrophy. Systolic function was vigorous. The    estimated ejection fraction was in the range of 65% to 70%. Wall    motion was normal; there were no regional wall motion    abnormalities. Doppler parameters are consistent with abnormal    left ventricular relaxation (grade 1 diastolic dysfunction).  - Aortic valve: Trileaflet; mildly thickened, mildly calcified    leaflets.  - Mitral valve: Moderately calcified annulus. There was trivial    regurgitation.  - Left atrium: The atrium was moderately dilated.     [name] Ku [facility] Short Stay Center/Anesthesiology Phone [phone] 11/18/2021 10:11 AM

## 2021-11-23 ENCOUNTER — Encounter (HOSPITAL_COMMUNITY)
Admission: RE | Disposition: A | Discharge status: Home / Self Care | Payer: Self-pay | Source: Home / Self Care | Attending: Orthopedic Surgery

## 2021-11-23 ENCOUNTER — Encounter (HOSPITAL_COMMUNITY): Payer: Self-pay | Admitting: Orthopedic Surgery

## 2021-11-23 ENCOUNTER — Ambulatory Visit (HOSPITAL_COMMUNITY)
Admission: RE | Admit: 2021-11-23 | Discharge: 2021-11-23 | Disposition: A | Discharge status: Home / Self Care | Payer: Federal, State, Local not specified - PPO | Attending: Orthopedic Surgery | Admitting: Orthopedic Surgery

## 2021-11-23 ENCOUNTER — Ambulatory Visit (HOSPITAL_COMMUNITY): Payer: Federal, State, Local not specified - PPO | Admitting: Anesthesiology

## 2021-11-23 ENCOUNTER — Other Ambulatory Visit: Payer: Self-pay

## 2021-11-23 ENCOUNTER — Ambulatory Visit (HOSPITAL_COMMUNITY): Payer: Federal, State, Local not specified - PPO | Admitting: Physician Assistant

## 2021-11-23 DIAGNOSIS — F418 Other specified anxiety disorders: Secondary | ICD-10-CM | POA: Insufficient documentation

## 2021-11-23 DIAGNOSIS — I10 Essential (primary) hypertension: Secondary | ICD-10-CM | POA: Diagnosis not present

## 2021-11-23 DIAGNOSIS — I4510 Unspecified right bundle-branch block: Secondary | ICD-10-CM | POA: Diagnosis not present

## 2021-11-23 DIAGNOSIS — Z6841 Body Mass Index (BMI) 40.0 and over, adult: Secondary | ICD-10-CM | POA: Insufficient documentation

## 2021-11-23 DIAGNOSIS — G5601 Carpal tunnel syndrome, right upper limb: Secondary | ICD-10-CM | POA: Insufficient documentation

## 2021-11-23 HISTORY — PX: CARPAL TUNNEL RELEASE: SHX101

## 2021-11-23 SURGERY — CARPAL TUNNEL RELEASE
Anesthesia: Monitor Anesthesia Care | Site: Hand | Laterality: Right

## 2021-11-23 MED ORDER — SUCCINYLCHOLINE CHLORIDE 200 MG/10ML IV SOSY
PREFILLED_SYRINGE | INTRAVENOUS | Status: AC
  Filled 2021-11-23: qty 10

## 2021-11-23 MED ORDER — BUPIVACAINE HCL (PF) 0.25 % IJ SOLN
INTRAMUSCULAR | Status: AC
  Filled 2021-11-23: qty 30

## 2021-11-23 MED ORDER — LACTATED RINGERS IV SOLN: INTRAVENOUS | Status: DC

## 2021-11-23 MED ORDER — ACETAMINOPHEN 500 MG PO TABS
ORAL_TABLET | ORAL | Status: AC
  Administered 2021-11-23: 06:00:00 1000 mg via ORAL
  Filled 2021-11-23: qty 2

## 2021-11-23 MED ORDER — CLINDAMYCIN PHOSPHATE 900 MG/50ML IV SOLN
INTRAVENOUS | Status: AC
  Filled 2021-11-23: qty 50

## 2021-11-23 MED ORDER — LIDOCAINE 2% (20 MG/ML) 5 ML SYRINGE
INTRAMUSCULAR | Status: AC
  Filled 2021-11-23: qty 5

## 2021-11-23 MED ORDER — LIDOCAINE HCL (PF) 1 % IJ SOLN
INTRAMUSCULAR | Status: DC | PRN
  Administered 2021-11-23: 4 mL

## 2021-11-23 MED ORDER — PROPOFOL 500 MG/50ML IV EMUL
INTRAVENOUS | Status: DC | PRN
  Administered 2021-11-23: 50 ug/kg/min via INTRAVENOUS

## 2021-11-23 MED ORDER — MIDAZOLAM HCL 2 MG/2ML IJ SOLN
INTRAMUSCULAR | Status: AC
  Filled 2021-11-23: qty 2

## 2021-11-23 MED ORDER — CHLORHEXIDINE GLUCONATE 0.12 % MT SOLN: 15.0000 mL | Freq: Once | OROMUCOSAL | Status: AC

## 2021-11-23 MED ORDER — ROCURONIUM BROMIDE 10 MG/ML (PF) SYRINGE
PREFILLED_SYRINGE | INTRAVENOUS | Status: AC
  Filled 2021-11-23: qty 10

## 2021-11-23 MED ORDER — PROPOFOL 10 MG/ML IV BOLUS
INTRAVENOUS | Status: AC
  Filled 2021-11-23: qty 20

## 2021-11-23 MED ORDER — ORAL CARE MOUTH RINSE: 15.0000 mL | Freq: Once | OROMUCOSAL | Status: AC

## 2021-11-23 MED ORDER — FENTANYL CITRATE (PF) 250 MCG/5ML IJ SOLN
INTRAMUSCULAR | Status: DC | PRN
  Administered 2021-11-23: 100 ug via INTRAVENOUS

## 2021-11-23 MED ORDER — MIDAZOLAM HCL 2 MG/2ML IJ SOLN
INTRAMUSCULAR | Status: DC | PRN
  Administered 2021-11-23: 2 mg via INTRAVENOUS

## 2021-11-23 MED ORDER — ACETAMINOPHEN 500 MG PO TABS: 1000.0000 mg | ORAL_TABLET | Freq: Once | ORAL | Status: AC

## 2021-11-23 MED ORDER — CHLORHEXIDINE GLUCONATE 0.12 % MT SOLN
OROMUCOSAL | Status: AC
  Administered 2021-11-23: 06:00:00 15 mL via OROMUCOSAL
  Filled 2021-11-23: qty 15

## 2021-11-23 MED ORDER — FENTANYL CITRATE (PF) 250 MCG/5ML IJ SOLN
INTRAMUSCULAR | Status: AC
  Filled 2021-11-23: qty 5

## 2021-11-23 MED ORDER — BUPIVACAINE HCL (PF) 0.25 % IJ SOLN
INTRAMUSCULAR | Status: DC | PRN
  Administered 2021-11-23: 4 mL

## 2021-11-23 MED ORDER — LIDOCAINE HCL 1 % IJ SOLN
INTRAMUSCULAR | Status: AC
  Filled 2021-11-23: qty 20

## 2021-11-23 MED ORDER — HYDROCODONE-ACETAMINOPHEN 5-325 MG PO TABS: 1.0000 | ORAL_TABLET | ORAL | 0 refills | Status: DC | PRN

## 2021-11-23 MED ORDER — CLINDAMYCIN PHOSPHATE 900 MG/50ML IV SOLN
900.0000 mg | INTRAVENOUS | Status: AC
  Administered 2021-11-23: 09:00:00 900 mg via INTRAVENOUS

## 2021-11-23 MED ORDER — 0.9 % SODIUM CHLORIDE (POUR BTL) OPTIME
TOPICAL | Status: DC | PRN
  Administered 2021-11-23: 08:00:00 1000 mL

## 2021-11-23 MED ORDER — FENTANYL CITRATE (PF) 100 MCG/2ML IJ SOLN: 25.0000 ug | INTRAMUSCULAR | Status: DC | PRN

## 2021-11-23 SURGICAL SUPPLY — 33 items
BAG COUNTER SPONGE SURGICOUNT (BAG) ×2 IMPLANT
BAG SURGICOUNT SPONGE COUNTING (BAG) ×1
BNDG CMPR 9X4 STRL LF SNTH (GAUZE/BANDAGES/DRESSINGS) ×1
BNDG CONFORM 3 STRL LF (GAUZE/BANDAGES/DRESSINGS) ×3 IMPLANT
BNDG ELASTIC 3X5.8 VLCR STR LF (GAUZE/BANDAGES/DRESSINGS) ×3 IMPLANT
BNDG ESMARK 4X9 LF (GAUZE/BANDAGES/DRESSINGS) ×3 IMPLANT
CORD BIPOLAR FORCEPS 12FT (ELECTRODE) ×3 IMPLANT
COVER SURGICAL LIGHT HANDLE (MISCELLANEOUS) ×3 IMPLANT
CUFF TOURN SGL QUICK 18X4 (TOURNIQUET CUFF) ×3 IMPLANT
CUFF TOURN SGL QUICK 24 (TOURNIQUET CUFF)
CUFF TRNQT CYL 24X4X16.5-23 (TOURNIQUET CUFF) IMPLANT
DRSG EMULSION OIL 3X3 NADH (GAUZE/BANDAGES/DRESSINGS) ×3 IMPLANT
DURAPREP 26ML APPLICATOR (WOUND CARE) ×3 IMPLANT
GAUZE SPONGE 4X4 12PLY STRL (GAUZE/BANDAGES/DRESSINGS) ×3 IMPLANT
GLOVE SRG 8 PF TXTR STRL LF DI (GLOVE) ×2 IMPLANT
GLOVE SURG ORTHO LTX SZ7.5 (GLOVE) ×3 IMPLANT
GLOVE SURG ORTHO LTX SZ8 (GLOVE) ×3 IMPLANT
GLOVE SURG UNDER POLY LF SZ8 (GLOVE) ×6
GOWN STRL REUS W/ TWL LRG LVL3 (GOWN DISPOSABLE) ×1 IMPLANT
GOWN STRL REUS W/ TWL XL LVL3 (GOWN DISPOSABLE) ×2 IMPLANT
GOWN STRL REUS W/TWL LRG LVL3 (GOWN DISPOSABLE) ×3
GOWN STRL REUS W/TWL XL LVL3 (GOWN DISPOSABLE) ×6
KIT BASIN OR (CUSTOM PROCEDURE TRAY) ×3 IMPLANT
KIT TURNOVER KIT B (KITS) ×3 IMPLANT
NDL HYPO 25X1 1.5 SAFETY (NEEDLE) IMPLANT
NEEDLE HYPO 25X1 1.5 SAFETY (NEEDLE) IMPLANT
NS IRRIG 1000ML POUR BTL (IV SOLUTION) ×3 IMPLANT
PACK ORTHO EXTREMITY (CUSTOM PROCEDURE TRAY) ×3 IMPLANT
PAD ARMBOARD 7.5X6 YLW CONV (MISCELLANEOUS) ×3 IMPLANT
SUT ETHILON 4 0 PS 2 18 (SUTURE) ×3 IMPLANT
SYR CONTROL 10ML LL (SYRINGE) IMPLANT
TOWEL GREEN STERILE FF (TOWEL DISPOSABLE) ×3 IMPLANT
UNDERPAD 30X36 HEAVY ABSORB (UNDERPADS AND DIAPERS) ×3 IMPLANT

## 2021-11-23 NOTE — Interval H&P Note (Signed)
History and Physical Interval Note:  11/23/2021 8:31 AM  [name]  has presented today for surgery, with the diagnosis of RIGHT CARPAL TUNNEL SYNDROME.  The various methods of treatment have been discussed with the patient and family. After consideration of risks, benefits and other options for treatment, the patient has consented to  Procedure(s): CARPAL TUNNEL RELEASE (Right) as a surgical intervention.  The patient's history has been reviewed, patient examined, no change in status, stable for surgery.  I have reviewed the patient's chart and labs.  Questions were answered to the patient's satisfaction.     Thera Flake

## 2021-11-23 NOTE — Transfer of Care (Signed)
Immediate Anesthesia Transfer of Care Note  Patient: [name]  Procedure(s) Performed: CARPAL TUNNEL RELEASE (Right: Hand)  Patient Location: PACU  Anesthesia Type:MAC  Level of Consciousness: drowsy and patient cooperative  Airway & Oxygen Therapy: Patient Spontanous Breathing  Post-op Assessment: Report given to RN and Post -op Vital signs reviewed and stable  Post vital signs: Reviewed and stable  Last Vitals:  Vitals Value Taken Time  BP    Temp 36.7 C 11/23/21 0920  Pulse 61 11/23/21 0920  Resp    SpO2 97 % 11/23/21 0920    Last Pain:  Vitals:   11/23/21 0615  TempSrc:   PainSc: 0-No pain         Complications: No notable events documented.

## 2021-11-23 NOTE — Discharge Instructions (Signed)
Diet: As you were doing prior to hospitalization   Activity: Increase activity slowly as tolerated  No lifting or driving for 48 hours  Shower: May shower without a dressing on post op day #3, NO SOAKING in tub NO SOAKING OR SUBMERGING HAND  Dressing: You may change your dressing on post op day #3.  Then change the dressing daily with sterile 4"x4"s gauze dressing  Or band aids.  Weight Bearing: no lifting with operative hand until incision is healed.  To prevent constipation: you may use a stool softener such as -  Colace ( over the counter) 100 mg by mouth twice a day  Drink plenty of fluids ( prune juice may be helpful) and high fiber foods  Miralax ( over the counter) for constipation as needed.   Precautions: If you experience chest pain or shortness of breath - call 911 immediately For transfer to the hospital emergency department!!  If you develop a fever greater that 101 F, purulent drainage from wound, increased redness or drainage from wound, or calf pain -- Call the office   Follow- Up Appointment: Please call for an appointment to be seen in 2 weeks  [place] - [phone]

## 2021-11-23 NOTE — Op Note (Signed)
NAMEMARLINA, CATALDI MEDICAL RECORD NO: [id number] ACCOUNT NO: [id number] DATE OF BIRTH: 07/06/1966 FACILITY: [facility] LOCATION: [facility]-PER[facility] PHYSICIAN: W D. Valeta Harms., MD  Operative Report   DATE OF PROCEDURE: 11/23/2021  PREOPERATIVE DIAGNOSIS:  Right carpal tunnel syndrome.  POSTOPERATIVE DIAGNOSIS:  Right carpal tunnel syndrome.  OPERATION:  Right carpal tunnel release.  SURGEON:  W D. Valeta Harms., MD  ANESTHESIA:  MAC with local.  TOURNIQUET TIME:  12 minutes.  DESCRIPTION OF PROCEDURE:  Infiltration of the palmar area with a mixture of lidocaine and Marcaine.  A curvilinear incision was made just ulnar to the thenar crease.  Dissection carried through the palmar fascia.  We released the transverse carpal  ligament on the ulnar side of the ligament distally to the level of the superficial arch proximally to include the antebrachial fascia of the wrist.  Nerve showed moderate compression.  Wound was irrigated, closed with nylon, placed in a lightly  compressive dressing.  Tourniquet was released after application of the dressing, taken to recovery room in stable condition.   [facility] D: 11/23/2021 9:23:47 am T: 11/23/2021 9:40:00 am  JOB: [id number]/ [id number]

## 2021-11-24 ENCOUNTER — Encounter (HOSPITAL_COMMUNITY): Payer: Self-pay | Admitting: Orthopedic Surgery

## 2021-11-25 NOTE — Anesthesia Postprocedure Evaluation (Signed)
Anesthesia Post Note  Patient: [name]  Procedure(s) Performed: CARPAL TUNNEL RELEASE (Right: Hand)     Patient location during evaluation: PACU Anesthesia Type: MAC Level of consciousness: awake and alert Pain management: pain level controlled Vital Signs Assessment: post-procedure vital signs reviewed and stable Respiratory status: spontaneous breathing, nonlabored ventilation, respiratory function stable and patient connected to nasal cannula oxygen Cardiovascular status: stable and blood pressure returned to baseline Postop Assessment: no apparent nausea or vomiting Anesthetic complications: no   No notable events documented.  Last Vitals:  Vitals:   11/23/21 0920 11/23/21 0935  BP: (!) 133/46 (!) 149/80  Pulse: 61 (!) 57  Resp: 13 (!) 21  Temp: 36.7 C 36.5 C  SpO2: 97% 95%    Last Pain:  Vitals:   11/23/21 0920  TempSrc:   PainSc: 0-No pain                 Tijana Walder L Elienai Gailey

## 2021-12-17 ENCOUNTER — Other Ambulatory Visit: Payer: Self-pay

## 2021-12-17 ENCOUNTER — Ambulatory Visit (INDEPENDENT_AMBULATORY_CARE_PROVIDER_SITE_OTHER): Payer: Federal, State, Local not specified - PPO

## 2021-12-17 DIAGNOSIS — I10 Essential (primary) hypertension: Secondary | ICD-10-CM | POA: Diagnosis not present

## 2021-12-17 DIAGNOSIS — I358 Other nonrheumatic aortic valve disorders: Secondary | ICD-10-CM | POA: Diagnosis not present

## 2021-12-17 LAB — ECHOCARDIOGRAM COMPLETE
AR max vel: 2.1 cm2
AV Area VTI: 2.54 cm2
AV Area mean vel: 2.34 cm2
AV Mean grad: 11.7 mmHg
AV Peak grad: 23.9 mmHg
Ao pk vel: 2.44 m/s
Area-P 1/2: 2.46 cm2
Calc EF: 62 %
MV VTI: 3.23 cm2
S' Lateral: 3.02 cm
Single Plane A2C EF: 62.8 %
Single Plane A4C EF: 64.9 %

## 2021-12-22 ENCOUNTER — Telehealth (INDEPENDENT_AMBULATORY_CARE_PROVIDER_SITE_OTHER): Payer: Federal, State, Local not specified - PPO | Admitting: Psychiatry

## 2021-12-22 ENCOUNTER — Encounter: Payer: Self-pay | Admitting: Psychiatry

## 2021-12-22 DIAGNOSIS — F338 Other recurrent depressive disorders: Secondary | ICD-10-CM

## 2021-12-22 DIAGNOSIS — F422 Mixed obsessional thoughts and acts: Secondary | ICD-10-CM

## 2021-12-22 MED ORDER — FLUOXETINE HCL 20 MG PO CAPS: 60.0000 mg | ORAL_CAPSULE | Freq: Every day | ORAL | 3 refills | Status: DC

## 2021-12-22 NOTE — Progress Notes (Signed)
[name] [id number] Oct 30, 1966 56 y.o.  Video Visit via My Chart  I connected with pt by My Chart and verified that I am speaking with the correct person using two identifiers.   I discussed the limitations, risks, security and privacy concerns of performing an evaluation and management service by My Chart  and the availability of in person appointments. I also discussed with the patient that there may be a patient responsible charge related to this service. The patient expressed understanding and agreed to proceed.  I discussed the assessment and treatment plan with the patient. The patient was provided an opportunity to ask questions and all were answered. The patient agreed with the plan and demonstrated an understanding of the instructions.   The patient was advised to call back or seek an in-person evaluation if the symptoms worsen or if the condition fails to improve as anticipated.  I provided 15 minutes of video time during this encounter.  The patient was located at home and the provider was located office. Session from 230 until 245 PM.  Subjective:   Patient ID:  [name] is a 55 y.o. (DOB 17-Nov-1966) female.  Chief Complaint:  Chief Complaint  Patient presents with   Follow-up    Mood and anxiety   Anxiety  FU anxiety and mood.   HPI  [name] presents to the office today for follow-up of depression with seasonality and anxiety.     seen July 2020.  No meds were changed.  04/10/20 appt.  The following noted: Teaches online.  Difficult during Covid.  Vaccinated which helped stress over the virus.  Able to get out more.  Less anxiety.   Difficult to stay home all these months.  Good vacation helped.  Mood generally OK but Covid hard and stress of school uncertainties.  Before vacation hard to get OOB but better since then.  Not really obsessing.  Works from home.  That helps keep her on track and schedule. No worsening OCD but anxiety with Covid  is some better.  Last summer hard to get OOB but OK now. Ok this winter so far.  Vitamin D helped. Likes benefit lorazepam.  Only used rarely.  Otherwise usually ok. Plan: Continue fluoxetine 60 mg daily  12/22/2021 appt noted: Use lorazepam just as needed for anxiety and not often. Remains on fluoxetine 60 mg daily.  No SE Good not much obsessing unless really tired.   Stressful season bc H working a lot so everything falls to her.  Coping well. Not much seasonal depression.  Taking vitamin D in fall winter. [employer] grad HS this year and going to [employer]  Patient reports stable mood and denies depressed or irritable moods.  Patient denies any recent difficulty with anxiety.  Patient denies difficulty with sleep initiation or maintenance. Denies appetite disturbance.  Patient reports that energy and motivation have been good.  Patient denies any difficulty with concentration.  Patient denies any suicidal ideation.  Past Psychiatric Medication Trials:' no others. Fluoxetine 59 Under the care of [facility] for many many years.  Review of Systems:  Review of Systems  Cardiovascular:  Negative for palpitations.  Musculoskeletal:  Positive for arthralgias, back pain and gait problem.  Neurological:  Negative for tremors and weakness.  Psychiatric/Behavioral:  Negative for agitation, behavioral problems, confusion, decreased concentration, dysphoric mood, hallucinations, self-injury, sleep disturbance and suicidal ideas. The patient is not nervous/anxious and is not hyperactive.    Medications: I have reviewed  the patient's current medications.  Current Outpatient Medications  Medication Sig Dispense Refill   carvedilol (COREG) 25 MG tablet TAKE 1 TABLET BY MOUTH TWICE DAILY 180 tablet 3   Cholecalciferol (VITAMIN D3) 5000 UNITS TABS Take 1 tablet by mouth in the morning.     Cyanocobalamin (VITAMIN B-12 PO) Take 1 tablet by mouth in the morning.      diphenhydramine-acetaminophen (TYLENOL PM) 25-500 MG TABS Take 1 tablet by mouth at bedtime as needed (sleep).     HYDROcodone-acetaminophen (NORCO) 5-325 MG tablet Take 1 tablet by mouth every 4 (four) hours as needed for moderate pain. 20 tablet 0   LORazepam (ATIVAN) 0.5 MG tablet TAKE 1 TABLET(0.5 MG) BY MOUTH EVERY 8 HOURS AS NEEDED FOR ANXIETY 30 tablet 0   Multiple Vitamin (MULTIVITAMIN WITH MINERALS) TABS tablet Take 1 tablet by mouth in the morning and at bedtime.     NORLYDA 0.35 MG tablet Take 1 tablet by mouth every evening.     triamterene-hydrochlorothiazide (MAXZIDE-25) 37.5-25 MG tablet Take 1 tablet by mouth daily. Pt. Is due for follow up. Please contact our office to set up appointment 90 tablet 1   FLUoxetine (PROZAC) 20 MG capsule Take 3 capsules (60 mg total) by mouth daily. 270 capsule 3   No current facility-administered medications for this visit.    Medication Side Effects: None  Allergies:  Allergies  Allergen Reactions   Darvocet [Propoxyphene N-Acetaminophen] Rash   Penicillins Swelling    Past Medical History:  Diagnosis Date   Anemia    Anxiety    Aortic stenosis 11/04/2021   Arthritis    knees    Depression    Headache    hx of migraines    History of hiatal hernia    Hypertension    Morbid obesity with BMI of 60.0-69.9, adult (HCC)    BMI 62   Murmur 08/15/2017   Pneumonia 07/18/2011   Preoperative evaluation of a medical condition to rule out surgical contraindications (TAR required) 11/04/2021    Family History  Problem Relation Age of Onset   Hypertension Mother    Hypertension Father    Cancer Father        prostate   Diabetes Father    Colon cancer Father    Hypertension Brother    Diabetes Paternal Aunt    Stroke Paternal Aunt    Mental illness Paternal Aunt    Diabetes Paternal Uncle    Breast cancer Maternal Grandmother    Heart disease Maternal Grandfather    Heart attack Maternal Grandfather    Cancer Paternal Grandmother         liver    Social History   Socioeconomic History   Marital status: Married    Spouse name: Not on file   Number of children: Not on file   Years of education: Not on file   Highest education level: Not on file  Occupational History   Not on file  Tobacco Use   Smoking status: Never   Smokeless tobacco: Never  Vaping Use   Vaping Use: Never used  Substance and Sexual Activity   Alcohol use: No   Drug use: No   Sexual activity: Yes    Birth control/protection: Pill    Comment: azurette  Other Topics Concern   Not on file  Social History Narrative   Not on file   Social Determinants of Health   Financial Resource Strain: Not on file  Food Insecurity: Not on file  Transportation Needs: Not on file  Physical Activity: Not on file  Stress: Not on file  Social Connections: Not on file  Intimate Partner Violence: Not on file    Past Medical History, Surgical history, Social history, and Family history were reviewed and updated as appropriate.   Please see review of systems for further details on the patient's review from today.   Objective:   Physical Exam:  There were no vitals taken for this visit.  Physical Exam Neurological:     Mental Status: She is alert and oriented to person, place, and time.     Cranial Nerves: No dysarthria.  Psychiatric:        Attention and Perception: Attention and perception normal.        Mood and Affect: Mood is anxious. Mood is not depressed.        Speech: Speech normal.        Behavior: Behavior is cooperative.        Thought Content: Thought content normal. Thought content is not paranoid or delusional. Thought content does not include homicidal or suicidal ideation. Thought content does not include suicidal plan.        Cognition and Memory: Cognition and memory normal.        Judgment: Judgment normal.     Comments: Insight intact Minimal obsessions    Lab Review:     Component Value Date/Time   NA 140 11/17/2021  1053   NA 142 12/11/2020 1122   K 3.6 11/17/2021 1053   CL 102 11/17/2021 1053   CO2 29 11/17/2021 1053   GLUCOSE 102 (H) 11/17/2021 1053   BUN 17 11/17/2021 1053   BUN 17 12/11/2020 1122   CREATININE 0.73 11/17/2021 1053   CREATININE 0.57 08/25/2014 0836   CALCIUM 9.0 11/17/2021 1053   PROT 6.3 (L) 11/17/2021 1053   PROT 6.5 12/11/2020 1122   ALBUMIN 3.6 11/17/2021 1053   ALBUMIN 4.2 12/11/2020 1122   AST 19 11/17/2021 1053   ALT 17 11/17/2021 1053   ALKPHOS 87 11/17/2021 1053   BILITOT 1.0 11/17/2021 1053   BILITOT 0.7 12/11/2020 1122   GFRNONAA >60 11/17/2021 1053   GFRAA 95 12/11/2020 1122       Component Value Date/Time   WBC 11.0 (H) 11/17/2021 1053   RBC 5.02 11/17/2021 1053   HGB 13.9 11/17/2021 1053   HGB 13.1 06/05/2014 0920   HCT 45.4 11/17/2021 1053   HCT 41.1 06/05/2014 0920   PLT 267 11/17/2021 1053   PLT 301 06/05/2014 0920   MCV 90.4 11/17/2021 1053   MCV 89.2 06/05/2014 0920   MCH 27.7 11/17/2021 1053   MCHC 30.6 11/17/2021 1053   RDW 15.3 11/17/2021 1053   RDW 14.3 06/05/2014 0920   LYMPHSABS 1.4 01/01/2015 0621   LYMPHSABS 1.9 06/05/2014 0920   MONOABS 1.6 (H) 01/01/2015 0621   MONOABS 0.9 06/05/2014 0920   EOSABS 0.1 01/01/2015 0621   EOSABS 0.2 06/05/2014 0920   BASOSABS 0.0 01/01/2015 0621   BASOSABS 0.1 06/05/2014 0920    No results found for: POCLITH, LITHIUM   No results found for: PHENYTOIN, PHENOBARB, VALPROATE, CBMZ   .res Assessment: Plan:    [name] was seen today for follow-up and anxiety.  Diagnoses and all orders for this visit:  Mixed obsessional thoughts and acts -     FLUoxetine (PROZAC) 20 MG capsule; Take 3 capsules (60 mg total) by mouth daily.  Seasonal depression (HCC) -     FLUoxetine (PROZAC) 20  MG capsule; Take 3 capsules (60 mg total) by mouth daily.  Greater than 50% of 15 min video face to face time with patient was spent on counseling and coordination of care. We discussed OCD manageable.  Not much  depression at present.  Tolerating meds.  Rare BZ. High relapse risk without SSRI   No change indicated.  Continue fluoxetine 60 mg daily  We discussed the short-term risks associated with benzodiazepines including sedation and increased fall risk among others.  Discussed long-term side effect risk including dependence, potential withdrawal symptoms, and the potential eventual dose-related risk of dementia.  But recent studies from 2020 dispute this association between benzodiazepines and dementia risk. Newer studies in 2020 do not support an association with dementia.  FU 6 mos  Meredith Staggersarey Cottle, MD, DFAPA   Please see After Visit Summary for patient specific instructions.  Future Appointments  Date Time Provider Department Center  04/01/2022  9:40 AM [facility] DIAG [facility] 1 [facility]  04/01/2022  9:50 AM [facility] US 1 [facility]  04/01/2022 10:00 AM [facility] US 1 [facility]    No orders of the defined types were placed in this encounter.     -------------------------------

## 2021-12-27 ENCOUNTER — Other Ambulatory Visit (HOSPITAL_BASED_OUTPATIENT_CLINIC_OR_DEPARTMENT_OTHER): Payer: Self-pay | Admitting: *Deleted

## 2021-12-27 DIAGNOSIS — I10 Essential (primary) hypertension: Secondary | ICD-10-CM

## 2021-12-27 DIAGNOSIS — I350 Nonrheumatic aortic (valve) stenosis: Secondary | ICD-10-CM

## 2022-01-25 ENCOUNTER — Other Ambulatory Visit: Payer: Self-pay | Admitting: Cardiovascular Disease

## 2022-01-25 NOTE — Telephone Encounter (Signed)
Rx(s) sent to pharmacy electronically.  

## 2022-01-28 DIAGNOSIS — R509 Fever, unspecified: Secondary | ICD-10-CM | POA: Diagnosis not present

## 2022-01-28 DIAGNOSIS — R52 Pain, unspecified: Secondary | ICD-10-CM | POA: Diagnosis not present

## 2022-01-28 DIAGNOSIS — Z03818 Encounter for observation for suspected exposure to other biological agents ruled out: Secondary | ICD-10-CM | POA: Diagnosis not present

## 2022-02-03 DIAGNOSIS — M170 Bilateral primary osteoarthritis of knee: Secondary | ICD-10-CM | POA: Diagnosis not present

## 2022-04-01 ENCOUNTER — Ambulatory Visit
Admission: RE | Admit: 2022-04-01 | Discharge: 2022-04-01 | Disposition: A | Discharge status: Home / Self Care | Payer: Federal, State, Local not specified - PPO | Source: Ambulatory Visit | Attending: Obstetrics and Gynecology | Admitting: Obstetrics and Gynecology

## 2022-04-01 ENCOUNTER — Other Ambulatory Visit: Payer: Self-pay | Admitting: Obstetrics and Gynecology

## 2022-04-01 ENCOUNTER — Ambulatory Visit: Payer: Federal, State, Local not specified - PPO

## 2022-04-01 DIAGNOSIS — N6321 Unspecified lump in the left breast, upper outer quadrant: Secondary | ICD-10-CM | POA: Diagnosis not present

## 2022-04-01 DIAGNOSIS — N6489 Other specified disorders of breast: Secondary | ICD-10-CM | POA: Diagnosis not present

## 2022-04-01 DIAGNOSIS — N6320 Unspecified lump in the left breast, unspecified quadrant: Secondary | ICD-10-CM

## 2022-04-12 ENCOUNTER — Other Ambulatory Visit: Payer: Self-pay | Admitting: Cardiovascular Disease

## 2022-04-12 NOTE — Telephone Encounter (Signed)
Rx(s) sent to pharmacy electronically.  

## 2022-05-12 DIAGNOSIS — M170 Bilateral primary osteoarthritis of knee: Secondary | ICD-10-CM | POA: Diagnosis not present

## 2022-06-23 ENCOUNTER — Telehealth (INDEPENDENT_AMBULATORY_CARE_PROVIDER_SITE_OTHER): Payer: Federal, State, Local not specified - PPO | Admitting: Psychiatry

## 2022-06-23 ENCOUNTER — Encounter: Payer: Self-pay | Admitting: Psychiatry

## 2022-06-23 DIAGNOSIS — F338 Other recurrent depressive disorders: Secondary | ICD-10-CM | POA: Diagnosis not present

## 2022-06-23 DIAGNOSIS — F422 Mixed obsessional thoughts and acts: Secondary | ICD-10-CM

## 2022-06-23 NOTE — Progress Notes (Signed)
[name] [id number] November 02, 1966 56 y.o.  Video Visit via My Chart  I connected with pt by My Chart and verified that I am speaking with the correct person using two identifiers.   I discussed the limitations, risks, security and privacy concerns of performing an evaluation and management service by My Chart  and the availability of in person appointments. I also discussed with the patient that there may be a patient responsible charge related to this service. The patient expressed understanding and agreed to proceed.  I discussed the assessment and treatment plan with the patient. The patient was provided an opportunity to ask questions and all were answered. The patient agreed with the plan and demonstrated an understanding of the instructions.   The patient was advised to call back or seek an in-person evaluation if the symptoms worsen or if the condition fails to improve as anticipated.  I provided 15 minutes of video time during this encounter.  The patient was located at home and the provider was located office. Session from 130-200  Subjective:   Patient ID:  [name] is a 56 y.o. (DOB 08/25/1966) female.  Chief Complaint:  Chief Complaint  Patient presents with   Follow-up   Mixed obsessional thoughts and acts   Anxiety  FU anxiety and mood.   HPI  Aniza Shor presents to the office today for follow-up of depression with seasonality and anxiety.     seen July 2020.  No meds were changed.  04/10/20 appt.  The following noted: Teaches online.  Difficult during Covid.  Vaccinated which helped stress over the virus.  Able to get out more.  Less anxiety.   Difficult to stay home all these months.  Good vacation helped.  Mood generally OK but Covid hard and stress of school uncertainties.  Before vacation hard to get OOB but better since then.  Not really obsessing.  Works from home.  That helps keep her on track and schedule. No worsening OCD but anxiety  with Covid is some better.  Last summer hard to get OOB but OK now. Ok this winter so far.  Vitamin D helped. Likes benefit lorazepam.  Only used rarely.  Otherwise usually ok. Plan: Continue fluoxetine 60 mg daily  12/22/2021 appt noted: Use lorazepam just as needed for anxiety and not often. Remains on fluoxetine 60 mg daily.  No SE Good not much obsessing unless really tired.   Stressful season bc H working a lot so everything falls to her.  Coping well. Not much seasonal depression.  Taking vitamin D in fall winter. D [place] grad HS this year and going to [employer] Plan: no med changes  06/23/22 appt noted: Consistent with fluoxetine 60. No SE Use lorazepam prn when visits family to keep from panic.  Not panic lately.  Anxiety is good. 3 weeks away H retiring and D going off to college.  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 Minimal obsessing unless really tired.  Under control.   Patient reports stable mood and denies depressed or irritable moods.  Patient denies any recent difficulty with anxiety.  Patient denies difficulty with sleep initiation or maintenance. Denies appetite disturbance.  Patient reports that energy and motivation have been good.  Patient denies any difficulty with concentration.  Patient denies any suicidal ideation. Health stable.  Works from home.  Past Psychiatric Medication Trials:' no others. Fluoxetine 60 lorazepam Under the care of [facility] for many many years.  Review of Systems:  Review of Systems  Cardiovascular:  Negative for palpitations.  Musculoskeletal:  Positive for arthralgias, back pain and gait problem.  Neurological:  Negative for tremors.  Psychiatric/Behavioral:  Negative for agitation, behavioral problems, confusion, decreased concentration, dysphoric mood, hallucinations, self-injury, sleep disturbance and suicidal ideas. The patient is not nervous/anxious and is not hyperactive.      Medications: I have reviewed the patient's current medications.  Current Outpatient Medications  Medication Sig Dispense Refill   carvedilol (COREG) 25 MG tablet TAKE 1 TABLET BY MOUTH TWICE DAILY 180 tablet 2   Cholecalciferol (VITAMIN D3) 5000 UNITS TABS Take 1 tablet by mouth in the morning.     Cyanocobalamin (VITAMIN B-12 PO) Take 1 tablet by mouth in the morning.     diphenhydramine-acetaminophen (TYLENOL PM) 25-500 MG TABS Take 1 tablet by mouth at bedtime as needed (sleep).     FLUoxetine (PROZAC) 20 MG capsule Take 3 capsules (60 mg total) by mouth daily. 270 capsule 3   HYDROcodone-acetaminophen (NORCO) 5-325 MG tablet Take 1 tablet by mouth every 4 (four) hours as needed for moderate pain. 20 tablet 0   LORazepam (ATIVAN) 0.5 MG tablet TAKE 1 TABLET(0.5 MG) BY MOUTH EVERY 8 HOURS AS NEEDED FOR ANXIETY 30 tablet 0   Multiple Vitamin (MULTIVITAMIN WITH MINERALS) TABS tablet Take 1 tablet by mouth in the morning and at bedtime.     NORLYDA 0.35 MG tablet Take 1 tablet by mouth every evening.     triamterene-hydrochlorothiazide (MAXZIDE-25) 37.5-25 MG tablet Take 1 tablet by mouth daily. 90 tablet 1   No current facility-administered medications for this visit.    Medication Side Effects: None  Allergies:  Allergies  Allergen Reactions   Darvocet [Propoxyphene N-Acetaminophen] Rash   Penicillins Swelling    Past Medical History:  Diagnosis Date   Anemia    Anxiety    Aortic stenosis 11/04/2021   Arthritis    knees    Depression    Headache    hx of migraines    History of hiatal hernia    Hypertension    Morbid obesity with BMI of 60.0-69.9, adult (HCC)    BMI 62   Murmur 08/15/2017   Pneumonia 07/18/2011   Preoperative evaluation of a medical condition to rule out surgical contraindications (TAR required) 11/04/2021    Family History  Problem Relation Age of Onset   Hypertension Mother    Hypertension Father    Cancer Father        prostate   Diabetes  Father    Colon cancer Father    Hypertension Brother    Diabetes Paternal Aunt    Stroke Paternal Aunt    Mental illness Paternal Aunt    Diabetes Paternal Uncle    Breast cancer Maternal Grandmother    Heart disease Maternal Grandfather    Heart attack Maternal Grandfather    Cancer Paternal Grandmother        liver    Social History   Socioeconomic History   Marital status: Married    Spouse name: Not on file   Number of children: Not on file   Years of education: Not on file   Highest education level: Not on file  Occupational History   Not on file  Tobacco Use   Smoking status: Never   Smokeless tobacco: Never  Vaping Use   Vaping Use: Never used  Substance and Sexual Activity   Alcohol use: No   Drug use: No  Sexual activity: Yes    Birth control/protection: Pill    Comment: azurette  Other Topics Concern   Not on file  Social History Narrative   Not on file   Social Determinants of Health   Financial Resource Strain: Not on file  Food Insecurity: Not on file  Transportation Needs: Not on file  Physical Activity: Not on file  Stress: Not on file  Social Connections: Not on file  Intimate Partner Violence: Not on file    Past Medical History, Surgical history, Social history, and Family history were reviewed and updated as appropriate.   Please see review of systems for further details on the patient's review from today.   Objective:   Physical Exam:  There were no vitals taken for this visit.  Physical Exam Neurological:     Mental Status: She is alert and oriented to person, place, and time.     Cranial Nerves: No dysarthria.  Psychiatric:        Attention and Perception: Attention and perception normal.        Mood and Affect: Mood is not anxious or depressed.        Speech: Speech normal.        Behavior: Behavior is cooperative.        Thought Content: Thought content normal. Thought content is not paranoid or delusional. Thought content  does not include homicidal or suicidal ideation. Thought content does not include suicidal plan.        Cognition and Memory: Cognition and memory normal.        Judgment: Judgment normal.     Comments: Insight intact Minimal obsessions     Lab Review:     Component Value Date/Time   NA 140 11/17/2021 1053   NA 142 12/11/2020 1122   K 3.6 11/17/2021 1053   CL 102 11/17/2021 1053   CO2 29 11/17/2021 1053   GLUCOSE 102 (H) 11/17/2021 1053   BUN 17 11/17/2021 1053   BUN 17 12/11/2020 1122   CREATININE 0.73 11/17/2021 1053   CREATININE 0.57 08/25/2014 0836   CALCIUM 9.0 11/17/2021 1053   PROT 6.3 (L) 11/17/2021 1053   PROT 6.5 12/11/2020 1122   ALBUMIN 3.6 11/17/2021 1053   ALBUMIN 4.2 12/11/2020 1122   AST 19 11/17/2021 1053   ALT 17 11/17/2021 1053   ALKPHOS 87 11/17/2021 1053   BILITOT 1.0 11/17/2021 1053   BILITOT 0.7 12/11/2020 1122   GFRNONAA >60 11/17/2021 1053   GFRAA 95 12/11/2020 1122       Component Value Date/Time   WBC 11.0 (H) 11/17/2021 1053   RBC 5.02 11/17/2021 1053   HGB 13.9 11/17/2021 1053   HGB 13.1 06/05/2014 0920   HCT 45.4 11/17/2021 1053   HCT 41.1 06/05/2014 0920   PLT 267 11/17/2021 1053   PLT 301 06/05/2014 0920   MCV 90.4 11/17/2021 1053   MCV 89.2 06/05/2014 0920   MCH 27.7 11/17/2021 1053   MCHC 30.6 11/17/2021 1053   RDW 15.3 11/17/2021 1053   RDW 14.3 06/05/2014 0920   LYMPHSABS 1.4 01/01/2015 0621   LYMPHSABS 1.9 06/05/2014 0920   MONOABS 1.6 (H) 01/01/2015 0621   MONOABS 0.9 06/05/2014 0920   EOSABS 0.1 01/01/2015 0621   EOSABS 0.2 06/05/2014 0920   BASOSABS 0.0 01/01/2015 0621   BASOSABS 0.1 06/05/2014 0920    No results found for: "POCLITH", "LITHIUM"   No results found for: "PHENYTOIN", "PHENOBARB", "VALPROATE", "CBMZ"   .res Assessment: Plan:  Anjolie was seen today for follow-up, mixed obsessional thoughts and acts and anxiety.  Diagnoses and all orders for this visit:  Mixed obsessional thoughts and  acts  Seasonal depression (HCC)  Greater than 50% of 15 min video face to face time with patient was spent on counseling and coordination of care. We discussed OCD manageable.  Not much depression at present.  Tolerating meds.  Rare BZ. High relapse risk in OCD without SSRI .  She agrees to continue  Disc big transitions of only D to college and H retiring at same time.  No change indicated.  Continue fluoxetine 60 mg daily Rare lorzepam prn  We discussed the short-term risks associated with benzodiazepines including sedation and increased fall risk among others.  Discussed long-term side effect risk including dependence, potential withdrawal symptoms, and the potential eventual dose-related risk of dementia.  But recent studies from 2020 dispute this association between benzodiazepines and dementia risk. Newer studies in 2020 do not support an association with dementia.  No med changes  FU 6 mos  Meredith Staggers, MD, DFAPA   Please see After Visit Summary for patient specific instructions.  Future Appointments  Date Time Provider Department Center  10/07/2022 10:00 AM [facility] [place] [facility]  10/07/2022 10:10 AM [facility] [place] [facility]    No orders of the defined types were placed in this encounter.     -------------------------------

## 2022-09-01 DIAGNOSIS — M170 Bilateral primary osteoarthritis of knee: Secondary | ICD-10-CM | POA: Diagnosis not present

## 2022-09-22 DIAGNOSIS — Z3040 Encounter for surveillance of contraceptives, unspecified: Secondary | ICD-10-CM | POA: Diagnosis not present

## 2022-09-22 DIAGNOSIS — Z1231 Encounter for screening mammogram for malignant neoplasm of breast: Secondary | ICD-10-CM | POA: Diagnosis not present

## 2022-09-22 DIAGNOSIS — Z01419 Encounter for gynecological examination (general) (routine) without abnormal findings: Secondary | ICD-10-CM | POA: Diagnosis not present

## 2022-09-22 DIAGNOSIS — Z1211 Encounter for screening for malignant neoplasm of colon: Secondary | ICD-10-CM | POA: Diagnosis not present

## 2022-10-06 ENCOUNTER — Other Ambulatory Visit: Payer: Self-pay | Admitting: Cardiovascular Disease

## 2022-10-06 NOTE — Telephone Encounter (Signed)
Rx(s) sent to pharmacy electronically.  

## 2022-10-07 ENCOUNTER — Ambulatory Visit
Admission: RE | Admit: 2022-10-07 | Discharge: 2022-10-07 | Disposition: A | Discharge status: Home / Self Care | Payer: Federal, State, Local not specified - PPO | Source: Ambulatory Visit | Attending: Obstetrics and Gynecology | Admitting: Obstetrics and Gynecology

## 2022-10-07 ENCOUNTER — Other Ambulatory Visit: Payer: Self-pay | Admitting: Obstetrics and Gynecology

## 2022-10-07 DIAGNOSIS — N6489 Other specified disorders of breast: Secondary | ICD-10-CM

## 2022-10-07 DIAGNOSIS — R92323 Mammographic fibroglandular density, bilateral breasts: Secondary | ICD-10-CM | POA: Diagnosis not present

## 2022-10-07 DIAGNOSIS — N6320 Unspecified lump in the left breast, unspecified quadrant: Secondary | ICD-10-CM

## 2022-10-07 DIAGNOSIS — N6321 Unspecified lump in the left breast, upper outer quadrant: Secondary | ICD-10-CM | POA: Diagnosis not present

## 2022-10-14 ENCOUNTER — Ambulatory Visit
Admission: RE | Admit: 2022-10-14 | Discharge: 2022-10-14 | Disposition: A | Discharge status: Home / Self Care | Payer: Federal, State, Local not specified - PPO | Source: Ambulatory Visit | Attending: Obstetrics and Gynecology | Admitting: Obstetrics and Gynecology

## 2022-10-14 DIAGNOSIS — N6320 Unspecified lump in the left breast, unspecified quadrant: Secondary | ICD-10-CM

## 2022-10-14 DIAGNOSIS — N6323 Unspecified lump in the left breast, lower outer quadrant: Secondary | ICD-10-CM | POA: Diagnosis not present

## 2022-10-14 DIAGNOSIS — D241 Benign neoplasm of right breast: Secondary | ICD-10-CM | POA: Diagnosis not present

## 2022-10-14 HISTORY — PX: BREAST BIOPSY: SHX20

## 2022-11-24 ENCOUNTER — Other Ambulatory Visit: Payer: Self-pay | Admitting: Cardiovascular Disease

## 2022-11-24 ENCOUNTER — Telehealth (HOSPITAL_BASED_OUTPATIENT_CLINIC_OR_DEPARTMENT_OTHER): Payer: Self-pay | Admitting: Cardiovascular Disease

## 2022-11-24 NOTE — Telephone Encounter (Signed)
*  STAT* If patient is at the pharmacy, call can be transferred to refill team.   1. Which medications need to be refilled? (please list name of each medication and dose if known)   triamterene-hydrochlorothiazide (MAXZIDE-25) 37.5-25 MG tablet   2. Which pharmacy/location (including street and city if local pharmacy) is medication to be sent to?  [id number] - [place], [place] - [address] [place] [address]   3. Do they need a 30 day or 90 day supply?   90 day  Patient stated she has 5 tablets left. Patient has appointment scheduled on 1/26.

## 2022-11-28 DIAGNOSIS — I119 Hypertensive heart disease without heart failure: Secondary | ICD-10-CM

## 2022-11-28 HISTORY — DX: Hypertensive heart disease without heart failure: I11.9

## 2022-12-21 ENCOUNTER — Other Ambulatory Visit: Payer: Self-pay | Admitting: Psychiatry

## 2022-12-21 ENCOUNTER — Ambulatory Visit (INDEPENDENT_AMBULATORY_CARE_PROVIDER_SITE_OTHER): Payer: Federal, State, Local not specified - PPO

## 2022-12-21 DIAGNOSIS — I350 Nonrheumatic aortic (valve) stenosis: Secondary | ICD-10-CM | POA: Diagnosis not present

## 2022-12-21 DIAGNOSIS — F422 Mixed obsessional thoughts and acts: Secondary | ICD-10-CM

## 2022-12-21 DIAGNOSIS — I10 Essential (primary) hypertension: Secondary | ICD-10-CM | POA: Diagnosis not present

## 2022-12-21 LAB — ECHOCARDIOGRAM COMPLETE
AR max vel: 1.45 cm2
AV Area VTI: 1.3 cm2
AV Area mean vel: 1.33 cm2
AV Mean grad: 10 mmHg
AV Peak grad: 17.5 mmHg
Ao pk vel: 2.09 m/s
Area-P 1/2: 2.56 cm2
S' Lateral: 2.17 cm

## 2022-12-22 ENCOUNTER — Encounter (HOSPITAL_BASED_OUTPATIENT_CLINIC_OR_DEPARTMENT_OTHER): Payer: Self-pay | Admitting: Family

## 2022-12-22 NOTE — Progress Notes (Signed)
Cardiology Office Note:    Date:  12/23/2022   ID:  [name], DOB [birth year], MRN [id number]  PCP:  Jarrett Soho, PA-C   [facility] Providers Cardiologist:  Chilton Si, MD     Referring MD: Jarrett Soho, PA-C   Chief Complaint  Patient presents with   follow up    Seen for Dr. Duke Salvia    History of Present Illness:    [name] is a 57 y.o. female with a hx of hypertension, morbid obesity, aortic stenosis, murmur, depression, anxiety.  She was last evaluated in our office on 11/04/2021 by Dr. Duke Salvia, at that time she was doing relatively well from a cardiac perspective.   She presents today for follow-up of her hypertension and AS.  She had a repeat echo on 12/21/2022, which indicated she could be in atrial fib.  EKG in the office confirms she is in atrial fib with RVR, heart rate 102.  She states over the last 6 months she has had relatively stressful events at home, husband recently retired and her daughter went to college, so it is work for her to decipher how she is feeling overall.  She was unaware that she was in atrial fib, she denied any palpitations, worsening fatigue. She denies chest pain, palpitations, dyspnea, pnd, orthopnea, n, v, dizziness, syncope, edema, weight gain, or early satiety.  Her CHA2DS2-VASc score is 2 (female, HTN), we discussed indications for anticoagulation and she agrees to proceed with anticoagulation. She has been checked for OSA in the past, approximately 7 years ago, and she states that she was told she did not have sleep apnea.  She has had some weight gain over the last few years.  We discussed that OSA can contribute to atrial fibrillation, and she agrees to proceed with that at home sleep study test.   Past Medical History:  Diagnosis Date   Anemia    Anxiety    Aortic stenosis 11/04/2021   Arthritis    knees    Depression    Headache    hx of migraines    History of hiatal hernia     Hypertension    LVH (left ventricular hypertrophy) due to hypertensive disease 11/2022   Morbid obesity with BMI of 60.0-69.9, adult (HCC)    BMI 62   Murmur 08/15/2017   Pneumonia 07/18/2011   Preoperative evaluation of a medical condition to rule out surgical contraindications (TAR required) 11/04/2021    Past Surgical History:  Procedure Laterality Date   APPENDECTOMY     BREAST BIOPSY Left 09/14/2020   BREAST BIOPSY Right 10/14/2022   [place] RT BREAST BX W LOC DEV 1ST LESION IMG BX SPEC US GUIDE 10/14/2022 [facility]   CARPAL TUNNEL RELEASE Right 11/23/2021   Procedure: CARPAL TUNNEL RELEASE;  Surgeon: Frederico Hamman, MD;  Location: [facility] OR;  Service: Orthopedics;  Laterality: Right;   CESAREAN SECTION     CHOLECYSTECTOMY     LAPAROSCOPIC ROUX-EN-Y GASTRIC BYPASS WITH HIATAL HERNIA REPAIR N/A 12/30/2014   Procedure: LAPAROSCOPIC ROUX-EN-Y GASTRIC BYPASS WITH HIATAL HERNIA REPAIR AND UPPER ENDOSCOPY;  Surgeon: [name], MD;  Location: [facility];  Service: General;  Laterality: N/A;   TONSILLECTOMY      Current Medications: Current Meds  Medication Sig   apixaban (ELIQUIS) 5 MG TABS tablet Take 1 tablet (5 mg total) by mouth 2 (two) times daily.   Cholecalciferol (VITAMIN D3) 5000 UNITS TABS Take 1 tablet by mouth in the morning.  Cyanocobalamin (VITAMIN B-12 PO) Take 1 tablet by mouth in the morning.   diphenhydramine-acetaminophen (TYLENOL PM) 25-500 MG TABS Take 1 tablet by mouth at bedtime as needed (sleep).   FLUoxetine (PROZAC) 20 MG capsule Take 3 capsules (60 mg total) by mouth daily.   LORazepam (ATIVAN) 0.5 MG tablet TAKE 1 TABLET(0.5 MG) BY MOUTH EVERY 8 HOURS AS NEEDED FOR ANXIETY   metoprolol tartrate (LOPRESSOR) 50 MG tablet Take 1 tablet (50 mg total) by mouth 2 (two) times daily.   Multiple Vitamin (MULTIVITAMIN WITH MINERALS) TABS tablet Take 1 tablet by mouth in the morning and at bedtime.   NORLYDA 0.35 MG tablet Take 1 tablet by mouth every evening.    triamterene-hydrochlorothiazide (MAXZIDE-25) 37.5-25 MG tablet TAKE 1 TABLET BY MOUTH DAILY   [DISCONTINUED] carvedilol (COREG) 25 MG tablet TAKE 1 TABLET BY MOUTH TWICE DAILY     Allergies:   Darvocet [propoxyphene n-acetaminophen] and Penicillins   Social History   Socioeconomic History   Marital status: Married    Spouse name: Not on file   Number of children: Not on file   Years of education: Not on file   Highest education level: Not on file  Occupational History   Not on file  Tobacco Use   Smoking status: Never   Smokeless tobacco: Never  Vaping Use   Vaping Use: Never used  Substance and Sexual Activity   Alcohol use: No   Drug use: No   Sexual activity: Yes    Birth control/protection: Pill    Comment: azurette  Other Topics Concern   Not on file  Social History Narrative   Not on file   Social Determinants of Health   Financial Resource Strain: Not on file  Food Insecurity: Not on file  Transportation Needs: Not on file  Physical Activity: Not on file  Stress: Not on file  Social Connections: Not on file     Family History: The patient's family history includes Breast cancer in her maternal grandmother; Cancer in her father and paternal grandmother; Colon cancer in her father; Diabetes in her father, paternal aunt, and paternal uncle; Heart attack in her maternal grandfather; Heart disease in her maternal grandfather; Hypertension in her brother, father, and mother; Mental illness in her paternal aunt; Stroke in her paternal aunt.  ROS:   Please see the history of present illness.     All other systems reviewed and are negative.  EKGs/Labs/Other Studies Reviewed:    The following studies were reviewed today:  12/21/2022 echo complete -appears to be in atrial fib, EF 60 to 65%, no RWMA, moderate LVH, RV mildly enlarged, MV is degenerative, trivial MR, severe mitral annular calcification, mild AV stenosis  12/17/2021 echo complete -EF 70 to 75%,  hyperdynamic function in the LV, mild LVH, grade 2 diastolic dysfunction, RV is mildly enlarged,  LA is severely dilated, trivial MR, moderate mitral annular calcification  EKG:  EKG is  ordered today.  The ekg ordered today demonstrates atrial fibrillation with RVR, HR 102 bpm.   Recent Labs: No results found for requested labs within last 365 days.  Recent Lipid Panel    Component Value Date/Time   CHOL 181 11/17/2021 1054   CHOL 202 (H) 12/11/2020 1122   TRIG 79 11/17/2021 1054   HDL 60 11/17/2021 1054   HDL 54 12/11/2020 1122   CHOLHDL 3.0 11/17/2021 1054   VLDL 16 11/17/2021 1054   LDLCALC 105 (H) 11/17/2021 1054   LDLCALC 125 (H) 12/11/2020 1122  Risk Assessment/Calculations:    CHA2DS2-VASc Score = 2   This indicates a 2.2% annual risk of stroke. The patient's score is based upon: CHF History: 0 HTN History: 1 Diabetes History: 0 Stroke History: 0 Vascular Disease History: 0 Age Score: 0 Gender Score: 1           STOP-Bang Score:  4       Physical Exam:    VS:  BP 132/72   Ht 5\' 9"  (1.753 m)   Wt (!) 417 lb 3.2 oz (189.2 kg)   BMI 61.61 kg/m     Wt Readings from Last 3 Encounters:  12/23/22 (!) 417 lb 3.2 oz (189.2 kg)  11/23/21 (!) 420 lb (190.5 kg)  11/17/21 (!) 420 lb (190.5 kg)     GEN:  Well nourished, obese, well developed in no acute distress HEENT: Normal NECK: No JVD; No carotid bruits LYMPHATICS: No lymphadenopathy CARDIAC: irregular rate and rhythm, no murmurs, rubs, gallops RESPIRATORY:  Clear to auscultation without rales, wheezing or rhonchi  ABDOMEN: Soft, non-tender, non-distended MUSCULOSKELETAL:  No edema; No deformity  SKIN: Warm and dry NEUROLOGIC:  Alert and oriented x 3 PSYCHIATRIC:  Normal affect   ASSESSMENT:    1. New onset atrial fibrillation ([place])   2. Snores   3. Sleep-disordered breathing   4. Essential hypertension   5. Morbid obesity with BMI of 60.0-69.9, adult (HCC)    PLAN:    In order of problems  listed above:  Atrial fibrillation - New onset AF with RVR, HR 102 bpm.  Denies palpitations or fatigue. Her mother has a history of "irregular heart rate. CHA2DS2-VASc Score = 2 [CHF History: 0, HTN History: 1, Diabetes History: 0, Stroke History: 0, Vascular Disease History: 0, Age Score: 0, Gender Score: 1].  Therefore, the patient's annual risk of stroke is 2.2 %. Start Eliquis 5 mg twice daily (does not meet criteria for dose reduction). D/C coreg. Start metoprolol tartrate 50 mg twice day. Check CBC, BMET, TSH, magnesium, A1C. Consider cardioversion at future visit.   Sleep disordered breathing/snores - STOP-Bang 4. Agreeable for home Itamar sleep study.    HTN - BP today 132/72, currently controlled. Continue current antihypertensive regimen.      Disposition - Start Eliquis 5 mg twice daily. Stop coreg. Start metoprolol tartrate 50 mg twice daily. Check CBC, BMET, TSH, mag, A1C. Return in 2 weeks.       Medication Adjustments/Labs and Tests Ordered: Current medicines are reviewed at length with the patient today.  Concerns regarding medicines are outlined above.  Orders Placed This Encounter  Procedures   Basic metabolic panel   TSH   Magnesium   CBC   HgB A1c   Itamar Sleep Study   Meds ordered this encounter  Medications   metoprolol tartrate (LOPRESSOR) 50 MG tablet    Sig: Take 1 tablet (50 mg total) by mouth 2 (two) times daily.    Dispense:  180 tablet    Refill:  3   apixaban (ELIQUIS) 5 MG TABS tablet    Sig: Take 1 tablet (5 mg total) by mouth 2 (two) times daily.    Dispense:  180 tablet    Refill:  3    Patient Instructions  Medication Instructions:  Your physician has recommended you make the following change in your medication:   Stop: Coreg  _____________________________________  Start: Metoprolol Tartrate 50mg  twice daily   Start: Eliquis 5mg  twice daily- We will do a prior auth request to insurance.  We will let you know when it is approved! We  have given you a box of samples today and a copay card!   *If you need a refill on your cardiac medications before your next appointment, please call your pharmacy*   Lab Work: Your physician recommends that you return for lab work today- BMP, CBC, TSH, MAG, A1C  If you have labs (blood work) drawn today and your tests are completely normal, you will receive your results only by: MyChart Message (if you have MyChart) OR A paper copy in the mail If you have any lab test that is abnormal or we need to change your treatment, we will call you to review the results.   Testing/Procedures: WatchPAT?  Is a FDA cleared portable home sleep study test that uses a watch and 3 points of contact to monitor 7 different channels, including your heart rate, oxygen saturations, body position, snoring, and chest motion.  The study is easy to use from the comfort of your own home and accurately detect sleep apnea.  Before bed, you attach the chest sensor, attached the sleep apnea bracelet to your nondominant hand, and attach the finger probe.  After the study, the raw data is downloaded from the watch and scored for apnea events.   For more information: [email or web address]  Patient Testing Instructions:  Do not put battery into the device until bedtime when you are ready to begin the test. Please call the support number if you need assistance after following the instructions below: 24 hour support line- [phone] or ITAMAR support at [phone] (option 2)  Download the IntelWatchPAT One" app through the google play store or App Store  Be sure to turn on or enable access to bluetooth in settlings on your smartphone/ device  Make sure no other bluetooth devices are on and within the vicinity of your smartphone/ device and WatchPAT watch during testing.  Make sure to leave your smart phone/ device plugged in and charging all night.  When ready for bed:  Follow the instructions step  by step in the WatchPAT One App to activate the testing device. For additional instructions, including video instruction, visit the WatchPAT One video on Youtube. You can search for WatchPat One within Youtube (video is 4 minutes and 18 seconds) or enter: https://youtube/watch?[email or web address]_vbiwxE Please note: You will be prompted to enter a Pin to connect via bluetooth when starting the test. The PIN will be assigned to you when you receive the test.  The device is disposable, but it recommended that you retain the device until you receive a call letting you know the study has been received and the results have been interpreted.  We will let you know if the study did not transmit to [place] properly after the test is completed. You do not need to call us to confirm the receipt of the test.  Please complete the test within 48 hours of receiving PIN.   Frequently Asked Questions:  What is Watch Dennie Bible one?  A single use fully disposable home sleep apnea testing device and will not need to be returned after completion.  What are the requirements to use WatchPAT one?  The be able to have a successful watchpat one sleep study, you should have your Watch pat one device, your smart phone, watch pat one app, your PIN number and Internet access What type of phone do I need?  You should have a smart phone that uses Android 5.1 and above or any Iphone with IOS  10 and above How can I download the WatchPAT one app?  Based on your device type search for WatchPAT one app either in google play for android devices or APP store for Iphone's Where will I get my PIN for the study?  Your PIN will be provided by your physician's office. It is used for authentication and if you lose/forget your PIN, please reach out to your providers office.  I do not have Internet at home. Can I do WatchPAT one study?  WatchPAT One needs Internet connection throughout the night to be able to transmit the sleep data. You can use your home/local  internet or your cellular's data package. However, it is always recommended to use home/local Internet. It is estimated that between 20MB-30MB will be used with each study.However, the application will be looking for 80MB space in the phone to start the study.  What happens if I lose internet or bluetooth connection?  During the internet disconnection, your phone will not be able to transmit the sleep data. All the data, will be stored in your phone. As soon as the internet connection is back on, the phone will being sending the sleep data. During the bluetooth disconnection, WatchPAT one will not be able to to send the sleep data to your phone. Data will be kept in the [facility] one until two devices have bluetooth connection back on. As soon as the connection is back on, WatchPAT one will send the sleep data to the phone.  How long do I need to wear the WatchPAT one?  After you start the study, you should wear the device at least 6 hours.  How far should I keep my phone from the device?  During the night, your phone should be within 15 feet.  What happens if I leave the room for restroom or other reasons?  Leaving the room for any reason will not cause any problem. As soon as your get back to the room, both devices will reconnect and will continue to send the sleep data. Can I use my phone during the sleep study?  Yes, you can use your phone as usual during the study. But it is recommended to put your watchpat one on when you are ready to go to bed.  How will I get my study results?  A soon as you completed your study, your sleep data will be sent to the provider. They will then share the results with you when they are ready.   Follow-Up: At [facility], you and your health needs are our priority.  As part of our continuing mission to provide you with exceptional heart care, we have created designated Provider Care Teams.  These Care Teams include your primary Cardiologist (physician) and  Advanced Practice Providers (APPs -  Physician Assistants and Nurse Practitioners) who all work together to provide you with the care you need, when you need it.  We recommend signing up for the patient portal called "MyChart".  Sign up information is provided on this After Visit Summary.  MyChart is used to connect with patients for Virtual Visits (Telemedicine).  Patients are able to view lab/test results, encounter notes, upcoming appointments, etc.  Non-urgent messages can be sent to your provider as well.   To learn more about what you can do with MyChart, go to [email or web address].    Your next appointment:   2 week(s)  Provider:   Skeet Latch, MD or Laurann Montana, NP      Signed, Anderson Malta  Ramond Craver, NP  12/23/2022 4:48 PM    [facility]

## 2022-12-23 ENCOUNTER — Telehealth (HOSPITAL_BASED_OUTPATIENT_CLINIC_OR_DEPARTMENT_OTHER): Payer: Self-pay

## 2022-12-23 ENCOUNTER — Encounter (HOSPITAL_BASED_OUTPATIENT_CLINIC_OR_DEPARTMENT_OTHER): Payer: Self-pay | Admitting: Cardiology

## 2022-12-23 ENCOUNTER — Other Ambulatory Visit: Payer: Self-pay | Admitting: Cardiovascular Disease

## 2022-12-23 ENCOUNTER — Ambulatory Visit (HOSPITAL_BASED_OUTPATIENT_CLINIC_OR_DEPARTMENT_OTHER): Payer: Federal, State, Local not specified - PPO | Admitting: Cardiology

## 2022-12-23 VITALS — BP 132/72 | Ht 69.0 in | Wt >= 6400 oz

## 2022-12-23 DIAGNOSIS — R0683 Snoring: Secondary | ICD-10-CM

## 2022-12-23 DIAGNOSIS — I4891 Unspecified atrial fibrillation: Secondary | ICD-10-CM | POA: Diagnosis not present

## 2022-12-23 DIAGNOSIS — I10 Essential (primary) hypertension: Secondary | ICD-10-CM

## 2022-12-23 DIAGNOSIS — Z6841 Body Mass Index (BMI) 40.0 and over, adult: Secondary | ICD-10-CM | POA: Diagnosis not present

## 2022-12-23 DIAGNOSIS — G4730 Sleep apnea, unspecified: Secondary | ICD-10-CM

## 2022-12-23 MED ORDER — METOPROLOL TARTRATE 50 MG PO TABS: 50.0000 mg | ORAL_TABLET | Freq: Two times a day (BID) | ORAL | 3 refills | Status: DC

## 2022-12-23 MED ORDER — APIXABAN 5 MG PO TABS: 5.0000 mg | ORAL_TABLET | Freq: Two times a day (BID) | ORAL | 3 refills | Status: DC

## 2022-12-23 NOTE — Telephone Encounter (Signed)
Eliquis prior auth started via cover my meds, per cover my meds prior auth not required. Patient notified in person!

## 2022-12-23 NOTE — Patient Instructions (Addendum)
Medication Instructions:  Your physician has recommended you make the following change in your medication:   Stop: Coreg  _____________________________________  Start: Metoprolol Tartrate 50mg  twice daily   Start: Eliquis 5mg  twice daily- We will do a prior auth request to insurance. We will let you know when it is approved! We have given you a box of samples today and a copay card!   *If you need a refill on your cardiac medications before your next appointment, please call your pharmacy*   Lab Work: Your physician recommends that you return for lab work today- BMP, CBC, TSH, MAG, A1C  If you have labs (blood work) drawn today and your tests are completely normal, you will receive your results only by: MyChart Message (if you have MyChart) OR A paper copy in the mail If you have any lab test that is abnormal or we need to change your treatment, we will call you to review the results.   Testing/Procedures: WatchPAT?  Is a FDA cleared portable home sleep study test that uses a watch and 3 points of contact to monitor 7 different channels, including your heart rate, oxygen saturations, body position, snoring, and chest motion.  The study is easy to use from the comfort of your own home and accurately detect sleep apnea.  Before bed, you attach the chest sensor, attached the sleep apnea bracelet to your nondominant hand, and attach the finger probe.  After the study, the raw data is downloaded from the watch and scored for apnea events.   For more information: [email or web address]  Patient Testing Instructions:  Do not put battery into the device until bedtime when you are ready to begin the test. Please call the support number if you need assistance after following the instructions below: 24 hour support line- [phone] or ITAMAR support at [phone] (option 2)  Download the [phone] One" app through the google play store or App Store  Be sure to turn  on or enable access to bluetooth in settlings on your smartphone/ device  Make sure no other bluetooth devices are on and within the vicinity of your smartphone/ device and WatchPAT watch during testing.  Make sure to leave your smart phone/ device plugged in and charging all night.  When ready for bed:  Follow the instructions step by step in the WatchPAT One App to activate the testing device. For additional instructions, including video instruction, visit the WatchPAT One video on Youtube. You can search for WatchPat One within Youtube (video is 4 minutes and 18 seconds) or enter: https://youtube/watch?[email or web address]_vbiwxE Please note: You will be prompted to enter a Pin to connect via bluetooth when starting the test. The PIN will be assigned to you when you receive the test.  The device is disposable, but it recommended that you retain the device until you receive a call letting you know the study has been received and the results have been interpreted.  We will let you know if the study did not transmit to [phone] properly after the test is completed. You do not need to call [employer] to confirm the receipt of the test.  Please complete the test within 48 hours of receiving PIN.   Frequently Asked Questions:  What is Watch [place] one?  A single use fully disposable home sleep apnea testing device and will not need to be returned after completion.  What are the requirements to use WatchPAT one?  The be able to have a successful watchpat one sleep study, you should have  your Watch pat one device, your smart phone, watch pat one app, your PIN number and Internet access What type of phone do I need?  You should have a smart phone that uses Android 5.1 and above or any Iphone with IOS 10 and above How can I download the WatchPAT one app?  Based on your device type search for WatchPAT one app either in google play for android devices or APP store for Iphone's Where will I get my PIN for the study?  Your PIN will be  provided by your physician's office. It is used for authentication and if you lose/forget your PIN, please reach out to your providers office.  I do not have Internet at home. Can I do WatchPAT one study?  WatchPAT One needs Internet connection throughout the night to be able to transmit the sleep data. You can use your home/local internet or your cellular's data package. However, it is always recommended to use home/local Internet. It is estimated that between 20MB-30MB will be used with each study.However, the application will be looking for 80MB space in the phone to start the study.  What happens if I lose internet or bluetooth connection?  During the internet disconnection, your phone will not be able to transmit the sleep data. All the data, will be stored in your phone. As soon as the internet connection is back on, the phone will being sending the sleep data. During the bluetooth disconnection, WatchPAT one will not be able to to send the sleep data to your phone. Data will be kept in the [facility] one until two devices have bluetooth connection back on. As soon as the connection is back on, WatchPAT one will send the sleep data to the phone.  How long do I need to wear the WatchPAT one?  After you start the study, you should wear the device at least 6 hours.  How far should I keep my phone from the device?  During the night, your phone should be within 15 feet.  What happens if I leave the room for restroom or other reasons?  Leaving the room for any reason will not cause any problem. As soon as your get back to the room, both devices will reconnect and will continue to send the sleep data. Can I use my phone during the sleep study?  Yes, you can use your phone as usual during the study. But it is recommended to put your watchpat one on when you are ready to go to bed.  How will I get my study results?  A soon as you completed your study, your sleep data will be sent to the provider. They will  then share the results with you when they are ready.   Follow-Up: At [facility], you and your health needs are our priority.  As part of our continuing mission to provide you with exceptional heart care, we have created designated Provider Care Teams.  These Care Teams include your primary Cardiologist (physician) and Advanced Practice Providers (APPs -  Physician Assistants and Nurse Practitioners) who all work together to provide you with the care you need, when you need it.  We recommend signing up for the patient portal called "MyChart".  Sign up information is provided on this After Visit Summary.  MyChart is used to connect with patients for Virtual Visits (Telemedicine).  Patients are able to view lab/test results, encounter notes, upcoming appointments, etc.  Non-urgent messages can be sent to your provider as well.  To learn more about what you can do with MyChart, go to [email or web address].    Your next appointment:   2 week(s)  Provider:   Skeet Latch, MD or Laurann Montana, NP

## 2022-12-23 NOTE — Progress Notes (Signed)
1 box of Eliquis Samples given today  LOT: [id number] EXP 06/25

## 2022-12-24 LAB — BASIC METABOLIC PANEL WITH GFR
BUN/Creatinine Ratio: 24 — ABNORMAL HIGH (ref 9–23)
BUN: 19 mg/dL (ref 6–24)
CO2: 21 mmol/L (ref 20–29)
Calcium: 9.3 mg/dL (ref 8.7–10.2)
Chloride: 101 mmol/L (ref 96–106)
Creatinine, Ser: 0.8 mg/dL (ref 0.57–1.00)
Glucose: 99 mg/dL (ref 70–99)
Potassium: 4.5 mmol/L (ref 3.5–5.2)
Sodium: 140 mmol/L (ref 134–144)
eGFR: 86 mL/min/1.73

## 2022-12-24 LAB — CBC
Hematocrit: 45 % (ref 34.0–46.6)
Hemoglobin: 15.1 g/dL (ref 11.1–15.9)
MCH: 29.4 pg (ref 26.6–33.0)
MCHC: 33.6 g/dL (ref 31.5–35.7)
MCV: 88 fL (ref 79–97)
Platelets: 317 x10E3/uL (ref 150–450)
RBC: 5.13 x10E6/uL (ref 3.77–5.28)
RDW: 13.5 % (ref 11.7–15.4)
WBC: 12.4 x10E3/uL — ABNORMAL HIGH (ref 3.4–10.8)

## 2022-12-24 LAB — TSH: TSH: 1.43 u[IU]/mL (ref 0.450–4.500)

## 2022-12-24 LAB — HEMOGLOBIN A1C
Est. average glucose Bld gHb Est-mCnc: 120 mg/dL
Hgb A1c MFr Bld: 5.8 % — ABNORMAL HIGH (ref 4.8–5.6)

## 2022-12-24 LAB — MAGNESIUM: Magnesium: 2.3 mg/dL (ref 1.6–2.3)

## 2022-12-26 ENCOUNTER — Telehealth: Payer: Self-pay | Admitting: *Deleted

## 2022-12-26 ENCOUNTER — Encounter (HOSPITAL_BASED_OUTPATIENT_CLINIC_OR_DEPARTMENT_OTHER): Payer: Self-pay

## 2022-12-26 ENCOUNTER — Other Ambulatory Visit: Payer: Self-pay | Admitting: Cardiovascular Disease

## 2022-12-26 NOTE — Telephone Encounter (Signed)
Prior Authorization for [employer] sent to [employer] via [employer]. Per Royce Macadamia no PA is required. Kaila notified ok to activate device.

## 2022-12-26 NOTE — Addendum Note (Signed)
Addended by: Gerald Stabs on: 12/26/2022 07:58 AM   Modules accepted: Orders

## 2022-12-26 NOTE — Telephone Encounter (Signed)
Rx request sent to pharmacy.  

## 2022-12-29 ENCOUNTER — Encounter: Payer: Self-pay | Admitting: Psychiatry

## 2022-12-29 ENCOUNTER — Telehealth (INDEPENDENT_AMBULATORY_CARE_PROVIDER_SITE_OTHER): Payer: Federal, State, Local not specified - PPO | Admitting: Psychiatry

## 2022-12-29 DIAGNOSIS — F422 Mixed obsessional thoughts and acts: Secondary | ICD-10-CM | POA: Diagnosis not present

## 2022-12-29 DIAGNOSIS — F338 Other recurrent depressive disorders: Secondary | ICD-10-CM | POA: Diagnosis not present

## 2022-12-29 MED ORDER — FLUOXETINE HCL 20 MG PO CAPS: 60.0000 mg | ORAL_CAPSULE | Freq: Every day | ORAL | 3 refills | Status: DC

## 2022-12-29 MED ORDER — LORAZEPAM 0.5 MG PO TABS: ORAL_TABLET | ORAL | 2 refills | Status: DC

## 2022-12-29 NOTE — Progress Notes (Signed)
[name] [id number] 04-Oct-1966 57 y.o.  Video Visit via My Chart  I connected with pt by My Chart and verified that I am speaking with the correct person using two identifiers.   I discussed the limitations, risks, security and privacy concerns of performing an evaluation and management service by My Chart  and the availability of in person appointments. I also discussed with the patient that there may be a patient responsible charge related to this service. The patient expressed understanding and agreed to proceed.  I discussed the assessment and treatment plan with the patient. The patient was provided an opportunity to ask questions and all were answered. The patient agreed with the plan and demonstrated an understanding of the instructions.   The patient was advised to call back or seek an in-person evaluation if the symptoms worsen or if the condition fails to improve as anticipated.  I provided 15 minutes of video time during this encounter.  The patient was located at home and the provider was located office. Session from 11 until 11:30 AM.  Subjective:   Patient ID:  [name] is a 57 y.o. (DOB 06/05/1966) female.  Chief Complaint:  Chief Complaint  Patient presents with   Follow-up    Mixed obsessional thoughts and acts   Depression   Anxiety  FU anxiety and mood.   HPI  Makisha Marrin presents to the office today for follow-up of depression with seasonality and anxiety.     seen July 2020.  No meds were changed.  04/10/20 appt.  The following noted: Teaches online.  Difficult during Covid.  Vaccinated which helped stress over the virus.  Able to get out more.  Less anxiety.   Difficult to stay home all these months.  Good vacation helped.  Mood generally OK but Covid hard and stress of school uncertainties.  Before vacation hard to get OOB but better since then.  Not really obsessing.  Works from home.  That helps keep her on track and schedule. No  worsening OCD but anxiety with Covid is some better.  Last summer hard to get OOB but OK now. Ok this winter so far.  Vitamin D helped. Likes benefit lorazepam.  Only used rarely.  Otherwise usually ok. Plan: Continue fluoxetine 60 mg daily  12/22/2021 appt noted: Use lorazepam just as needed for anxiety and not often. Remains on fluoxetine 60 mg daily.  No SE Good not much obsessing unless really tired.   Stressful season bc H working a lot so everything falls to her.  Coping well. Not much seasonal depression.  Taking vitamin D in fall winter. D [place] grad HS this year and going to [employer] Plan: no med changes  06/23/22 appt noted: Consistent with fluoxetine 60. No SE Use lorazepam prn when visits family to keep from panic.  Not panic lately.  Anxiety is good. 3 weeks away H retiring and D going off to college.  [place] to [place] to be [place] and write [employer]. Minimal obsessing unless really tired.  Under control.   Patient reports stable mood and denies depressed or irritable moods.  Patient denies any recent difficulty with anxiety.  Patient denies difficulty with sleep initiation or maintenance. Denies appetite disturbance.  Patient reports that energy and motivation have been good.  Patient denies any difficulty with concentration.  Patient denies any suicidal ideation. Health stable.  Works from home. Plan: No change indicated.  Continue fluoxetine 60 mg daily Rare lorzepam prn  12/29/22  appt noted: Good overall.  Needed more lorazepam bc need more at [facility].  But not this month as much.  When with family needed more. 2 weeks ago dx afib and changed meds to metoprolol. Afib did not cause sx.   Anxiety ok now.  OCD is controlled.  No sig dep.  Sleep is good and no problems with meds. [place] adjusted to school at college.    Past Psychiatric Medication Trials:' no others. Fluoxetine 60 lorazepam Under the care of [facility] for many many  years.  Review of Systems:  Review of Systems  Respiratory:  Negative for shortness of breath.   Cardiovascular:  Negative for palpitations.  Musculoskeletal:  Positive for arthralgias, back pain and gait problem.  Neurological:  Negative for tremors.  Psychiatric/Behavioral:  Negative for agitation, behavioral problems, confusion, decreased concentration, dysphoric mood, hallucinations, self-injury, sleep disturbance and suicidal ideas. The patient is not nervous/anxious and is not hyperactive.     Medications: I have reviewed the patient's current medications.  Current Outpatient Medications  Medication Sig Dispense Refill   apixaban (ELIQUIS) 5 MG TABS tablet Take 1 tablet (5 mg total) by mouth 2 (two) times daily. 180 tablet 3   Cholecalciferol (VITAMIN D3) 5000 UNITS TABS Take 1 tablet by mouth in the morning.     Cyanocobalamin (VITAMIN B-12 PO) Take 1 tablet by mouth in the morning.     diphenhydramine-acetaminophen (TYLENOL PM) 25-500 MG TABS Take 1 tablet by mouth at bedtime as needed (sleep).     metoprolol tartrate (LOPRESSOR) 50 MG tablet Take 1 tablet (50 mg total) by mouth 2 (two) times daily. 180 tablet 3   Multiple Vitamin (MULTIVITAMIN WITH MINERALS) TABS tablet Take 1 tablet by mouth in the morning and at bedtime.     NORLYDA 0.35 MG tablet Take 1 tablet by mouth every evening.     triamterene-hydrochlorothiazide (MAXZIDE-25) 37.5-25 MG tablet TAKE 1 TABLET BY MOUTH DAILY 90 tablet 1   FLUoxetine (PROZAC) 20 MG capsule Take 3 capsules (60 mg total) by mouth daily. 270 capsule 3   LORazepam (ATIVAN) 0.5 MG tablet TAKE 1 TABLET(0.5 MG) BY MOUTH EVERY 8 HOURS AS NEEDED FOR ANXIETY 30 tablet 2   No current facility-administered medications for this visit.    Medication Side Effects: None  Allergies:  Allergies  Allergen Reactions   Darvocet [Propoxyphene N-Acetaminophen] Rash   Penicillins Swelling    Past Medical History:  Diagnosis Date   Anemia    Anxiety     Aortic stenosis 11/04/2021   Arthritis    knees    Depression    Headache    hx of migraines    History of hiatal hernia    Hypertension    LVH (left ventricular hypertrophy) due to hypertensive disease 11/2022   Morbid obesity with BMI of 60.0-69.9, adult ([place])    BMI 62   Murmur 08/15/2017   Pneumonia 07/18/2011   Preoperative evaluation of a medical condition to rule out surgical contraindications (TAR required) 11/04/2021    Family History  Problem Relation Age of Onset   Hypertension Mother    Hypertension Father    Cancer Father        prostate   Diabetes Father    Colon cancer Father    Hypertension Brother    Diabetes Paternal Aunt    Stroke Paternal Aunt    Mental illness Paternal Aunt    Diabetes Paternal Uncle    Breast cancer Maternal Grandmother  Heart disease Maternal Grandfather    Heart attack Maternal Grandfather    Cancer Paternal Grandmother        liver    Social History   Socioeconomic History   Marital status: Married    Spouse name: Not on file   Number of children: Not on file   Years of education: Not on file   Highest education level: Not on file  Occupational History   Not on file  Tobacco Use   Smoking status: Never   Smokeless tobacco: Never  Vaping Use   Vaping Use: Never used  Substance and Sexual Activity   Alcohol use: No   Drug use: No   Sexual activity: Yes    Birth control/protection: Pill    Comment: azurette  Other Topics Concern   Not on file  Social History Narrative   Not on file   Social Determinants of Health   Financial Resource Strain: Not on file  Food Insecurity: Not on file  Transportation Needs: Not on file  Physical Activity: Not on file  Stress: Not on file  Social Connections: Not on file  Intimate Partner Violence: Not on file    Past Medical History, Surgical history, Social history, and Family history were reviewed and updated as appropriate.   Please see review of systems for further  details on the patient's review from today.   Objective:   Physical Exam:  There were no vitals taken for this visit.  Physical Exam Neurological:     Mental Status: She is alert and oriented to person, place, and time.     Cranial Nerves: No dysarthria.  Psychiatric:        Attention and Perception: Attention and perception normal.        Mood and Affect: Mood is not anxious or depressed. Affect is not tearful.        Speech: Speech normal.        Behavior: Behavior is cooperative.        Thought Content: Thought content normal. Thought content is not paranoid or delusional. Thought content does not include homicidal or suicidal ideation. Thought content does not include suicidal plan.        Cognition and Memory: Cognition and memory normal.        Judgment: Judgment normal.     Comments: Insight intact Minimal obsessions     Lab Review:     Component Value Date/Time   NA 140 12/23/2022 1612   K 4.5 12/23/2022 1612   CL 101 12/23/2022 1612   CO2 21 12/23/2022 1612   GLUCOSE 99 12/23/2022 1612   GLUCOSE 102 (H) 11/17/2021 1053   BUN 19 12/23/2022 1612   CREATININE 0.80 12/23/2022 1612   CREATININE 0.57 08/25/2014 0836   CALCIUM 9.3 12/23/2022 1612   PROT 6.3 (L) 11/17/2021 1053   PROT 6.5 12/11/2020 1122   ALBUMIN 3.6 11/17/2021 1053   ALBUMIN 4.2 12/11/2020 1122   AST 19 11/17/2021 1053   ALT 17 11/17/2021 1053   ALKPHOS 87 11/17/2021 1053   BILITOT 1.0 11/17/2021 1053   BILITOT 0.7 12/11/2020 1122   GFRNONAA >60 11/17/2021 1053   GFRAA 95 12/11/2020 1122       Component Value Date/Time   WBC 12.4 (H) 12/23/2022 1612   WBC 11.0 (H) 11/17/2021 1053   RBC 5.13 12/23/2022 1612   RBC 5.02 11/17/2021 1053   HGB 15.1 12/23/2022 1612   HGB 13.1 06/05/2014 0920   HCT 45.0 12/23/2022 1612  HCT 41.1 06/05/2014 0920   PLT 317 12/23/2022 1612   MCV 88 12/23/2022 1612   MCV 89.2 06/05/2014 0920   MCH 29.4 12/23/2022 1612   MCH 27.7 11/17/2021 1053   MCHC 33.6  12/23/2022 1612   MCHC 30.6 11/17/2021 1053   RDW 13.5 12/23/2022 1612   RDW 14.3 06/05/2014 0920   LYMPHSABS 1.4 01/01/2015 0621   LYMPHSABS 1.9 06/05/2014 0920   MONOABS 1.6 (H) 01/01/2015 0621   MONOABS 0.9 06/05/2014 0920   EOSABS 0.1 01/01/2015 0621   EOSABS 0.2 06/05/2014 0920   BASOSABS 0.0 01/01/2015 0621   BASOSABS 0.1 06/05/2014 0920    No results found for: "POCLITH", "LITHIUM"   No results found for: "PHENYTOIN", "PHENOBARB", "VALPROATE", "CBMZ"   .res Assessment: Plan:    Braileigh was seen today for follow-up, depression and anxiety.  Diagnoses and all orders for this visit:  Mixed obsessional thoughts and acts -     LORazepam (ATIVAN) 0.5 MG tablet; TAKE 1 TABLET(0.5 MG) BY MOUTH EVERY 8 HOURS AS NEEDED FOR ANXIETY -     FLUoxetine (PROZAC) 20 MG capsule; Take 3 capsules (60 mg total) by mouth daily.  Seasonal depression (HCC) -     FLUoxetine (PROZAC) 20 MG capsule; Take 3 capsules (60 mg total) by mouth daily.  Greater than 50% of 15 min video face to face time with patient was spent on counseling and coordination of care. We discussed OCD manageable.  Not much depression at present.  Tolerating meds.  Rare BZ. High relapse risk in OCD without SSRI .  She agrees to continue  Disc big transitions of only D to college and H retiring at same time but this has smoothed out and not a problem now.  No change indicated.  Continue fluoxetine 60 mg daily Rare lorzepam prn  We discussed the short-term risks associated with benzodiazepines including sedation and increased fall risk among others.  Discussed long-term side effect risk including dependence, potential withdrawal symptoms, and the potential eventual dose-related risk of dementia.  But recent studies from 2020 dispute this association between benzodiazepines and dementia risk. Newer studies in 2020 do not support an association with dementia.  No med changes  FU 6 mos  Meredith Staggers, MD, DFAPA   Please  see After Visit Summary for patient specific instructions.  Future Appointments  Date Time Provider Department Center  01/06/2023  2:45 PM Alver Sorrow, NP [facility]  04/07/2023 12:30 PM [facility] [place] [facility]    No orders of the defined types were placed in this encounter.     -------------------------------

## 2023-01-04 ENCOUNTER — Encounter: Payer: Self-pay | Admitting: Internal Medicine

## 2023-01-05 ENCOUNTER — Encounter (HOSPITAL_BASED_OUTPATIENT_CLINIC_OR_DEPARTMENT_OTHER): Payer: Self-pay | Admitting: Cardiology

## 2023-01-05 NOTE — Progress Notes (Signed)
Cardiology Office Note:    Date:  01/06/2023   ID:  [name], DOB 1966/11/09, MRN [id number]  PCP:  Marda Stalker, [place] Prov[facility] Cardiologist:  Skeet Latch, MD     Referring MD: Marda Stalker, PA-C   Chief Complaint  Patient presents with   follow up for atrial fibrillation    History of Present Illness:    [name] is a 57 y.o. female with a hx of hypertension, aortic stenosis, morbid obesity, fibrillation (anticoagulated on Eliquis).  She was last seen in our office on 12/23/2022, at that time she was presenting for her routine follow-up, had had an echo a few days prior for her aortic stenosis.  Echo revealed that she was in atrial fibrillation.  Upon arrival to our office twelve-lead confirmed she was in atrial fibrillation.  She had no associated symptoms and was unaware that she was in atrial fibrillation.  CHA2DS2-VASc score was 2, she was started on Eliquis 5 mg twice a day. [facility] home sleep study was arranged.  CBC, bmet, TSH, mag were checked and were unremarkable.  She presents today for follow up of her atrial fibrillation. Unfortunately, she is still in atrial fibrillation. She is feeling better, than her previous visit. She has been taking her Eliquis twice daily, without any missed doses. She denies chest pain, palpitations, dyspnea, pnd, orthopnea, n, v, dizziness, syncope, edema, weight gain, or early satiety. Denies hematuria, hematochezia, hemoptysis. She does wear a pad for overnight incontinence, she has noticed scant blood over the last few days in the am. She feels it could be an abscess of some type.   Past Medical History:  Diagnosis Date   Anemia    Anxiety    Aortic stenosis 11/04/2021   Arthritis    knees    Atrial fibrillation (HCC)    Depression    Headache    hx of migraines    History of hiatal hernia    Hypertension    LVH (left ventricular hypertrophy) due to hypertensive disease 11/2022    Morbid obesity with BMI of 60.0-69.9, adult ([place])    BMI 62   Murmur 08/15/2017   Pneumonia 07/18/2011   Preoperative evaluation of a medical condition to rule out surgical contraindications (TAR required) 11/04/2021    Past Surgical History:  Procedure Laterality Date   APPENDECTOMY     BREAST BIOPSY Left 09/14/2020   BREAST BIOPSY Right 10/14/2022   [place] RT BREAST BX W LOC DEV 1ST LESION IMG BX SPEC US GUIDE 10/14/2022 [facility]   CARPAL TUNNEL RELEASE Right 11/23/2021   Procedure: CARPAL TUNNEL RELEASE;  Surgeon: Earlie Server, MD;  Location: [facility];  Service: Orthopedics;  Laterality: Right;   CESAREAN SECTION     CHOLECYSTECTOMY     LAPAROSCOPIC ROUX-EN-Y GASTRIC BYPASS WITH HIATAL HERNIA REPAIR N/A 12/30/2014   Procedure: LAPAROSCOPIC ROUX-EN-Y GASTRIC BYPASS WITH HIATAL HERNIA REPAIR AND UPPER ENDOSCOPY;  Surgeon: Gayland Curry, MD;  Location: [facility];  Service: General;  Laterality: N/A;   TONSILLECTOMY      Current Medications: Current Meds  Medication Sig   apixaban (ELIQUIS) 5 MG TABS tablet Take 1 tablet (5 mg total) by mouth 2 (two) times daily.   Cholecalciferol (VITAMIN D3) 5000 UNITS TABS Take 1 tablet by mouth in the morning.   Cyanocobalamin (VITAMIN B-12 PO) Take 1 tablet by mouth in the morning.   diphenhydramine-acetaminophen (TYLENOL PM) 25-500 MG TABS Take 1 tablet by mouth at  bedtime as needed (sleep).   FLUoxetine (PROZAC) 20 MG capsule Take 3 capsules (60 mg total) by mouth daily.   LORazepam (ATIVAN) 0.5 MG tablet TAKE 1 TABLET(0.5 MG) BY MOUTH EVERY 8 HOURS AS NEEDED FOR ANXIETY   metoprolol tartrate (LOPRESSOR) 50 MG tablet Take 1 tablet (50 mg total) by mouth 2 (two) times daily.   Multiple Vitamin (MULTIVITAMIN WITH MINERALS) TABS tablet Take 1 tablet by mouth in the morning and at bedtime.   NORLYDA 0.35 MG tablet Take 1 tablet by mouth every evening.   triamterene-hydrochlorothiazide (MAXZIDE-25) 37.5-25 MG tablet TAKE 1 TABLET BY MOUTH DAILY      Allergies:   Darvocet [propoxyphene n-acetaminophen] and Penicillins   Social History   Socioeconomic History   Marital status: Married    Spouse name: Not on file   Number of children: Not on file   Years of education: Not on file   Highest education level: Not on file  Occupational History   Not on file  Tobacco Use   Smoking status: Never   Smokeless tobacco: Never  Vaping Use   Vaping Use: Never used  Substance and Sexual Activity   Alcohol use: No   Drug use: No   Sexual activity: Yes    Birth control/protection: Pill    Comment: azurette  Other Topics Concern   Not on file  Social History Narrative   Not on file   Social Determinants of Health   Financial Resource Strain: Not on file  Food Insecurity: Not on file  Transportation Needs: Not on file  Physical Activity: Not on file  Stress: Not on file  Social Connections: Not on file     Family History: The patient's family history includes Breast cancer in her maternal grandmother; Cancer in her father and paternal grandmother; Colon cancer in her father; Diabetes in her father, paternal aunt, and paternal uncle; Heart attack in her maternal grandfather; Heart disease in her maternal grandfather; Hypertension in her brother, father, and mother; Mental illness in her paternal aunt; Stroke in her paternal aunt.  ROS:   Please see the history of present illness.    All other systems reviewed and are negative.  EKGs/Labs/Other Studies Reviewed:    The following studies were reviewed today:   EKG:  EKG is  ordered today.  The ekg ordered today demonstrates Atrial fibrillation, incomplete RBBB, HR 94 bpm.   Recent Labs: 12/23/2022: BUN 19; Creatinine, Ser 0.80; Hemoglobin 15.1; Magnesium 2.3; Platelets 317; Potassium 4.5; Sodium 140; TSH 1.430  Recent Lipid Panel    Component Value Date/Time   CHOL 181 11/17/2021 1054   CHOL 202 (H) 12/11/2020 1122   TRIG 79 11/17/2021 1054   HDL 60 11/17/2021 1054    HDL 54 12/11/2020 1122   CHOLHDL 3.0 11/17/2021 1054   VLDL 16 11/17/2021 1054   LDLCALC 105 (H) 11/17/2021 1054   LDLCALC 125 (H) 12/11/2020 1122     Risk Assessment/Calculations:    CHA2DS2-VASc Score = 2   This indicates a 2.2% annual risk of stroke. The patient's score is based upon: CHF History: 0 HTN History: 1 Diabetes History: 0 Stroke History: 0 Vascular Disease History: 0 Age Score: 0 Gender Score: 1           STOP-Bang Score:  4       Physical Exam:    VS:  BP 122/78   Pulse 94   Ht '5\' 9"'$  (1.753 m)   Wt (!) 413 lb (187.3 kg)  BMI 60.99 kg/m     Wt Readings from Last 3 Encounters:  01/06/23 (!) 413 lb (187.3 kg)  12/23/22 (!) 417 lb 3.2 oz (189.2 kg)  11/23/21 (!) 420 lb (190.5 kg)     GEN:  Well nourished, well developed in no acute distress HEENT: Normal NECK: No JVD; No carotid bruits LYMPHATICS: No lymphadenopathy CARDIAC: irregular rhythm, no murmurs, rubs, gallops RESPIRATORY:  Clear to auscultation without rales, wheezing or rhonchi  ABDOMEN: Soft, non-tender, non-distended MUSCULOSKELETAL:  No edema; No deformity  SKIN: Warm and dry NEUROLOGIC:  Alert and oriented x 3 PSYCHIATRIC:  Normal affect   ASSESSMENT:    1. Atrial fibrillation, unspecified type ([place])   2. Aortic valve stenosis, etiology of cardiac valve disease unspecified   3. Essential hypertension    PLAN:    In order of problems listed above:  Atrial fibrillation - AF rate controlled on EKG today. Discussed DCCV, she would like to proceed with cardioversion. Continue Eliquis 5 mg BID (no indication for dose reduction)--stressed importance of not missing any doses and reporting to the office if she inadvertently misses any doses. Continue metoprolol 50 mg BID. CHA2DS2-VASc Score = 2 [CHF History: 0, HTN History: 1, Diabetes History: 0, Stroke History: 0, Vascular Disease History: 0, Age Score: 0, Gender Score: 1].  Therefore, the patient's annual risk of stroke is 2.2 %.  She has noticed scant blood on her pad in the am, no overt bleeding. Will check CBC in preparation for her DCCV, also to determine if any anemia is present. Check BMET.  Aortic Valve Stenosis - Mild on most recent echo on 12/21/22, continue optimal BP control.  HTN - BP today 122/78, well controlled, continue current antihypertensive medication regimen.   Disposition - check CBC, BMET in preparation for DCCV, scheduled for 01/25/23. Return 2 weeks after cardioversion.   Shared Decision Making/Informed Consent The risks (stroke, cardiac arrhythmias rarely resulting in the need for a temporary or permanent pacemaker, skin irritation or burns and complications associated with conscious sedation including aspiration, arrhythmia, respiratory failure and death), benefits (restoration of normal sinus rhythm) and alternatives of a direct current cardioversion were explained in detail to Ms. Deitrich and she agrees to proceed.      Medication Adjustments/Labs and Tests Ordered: Current medicines are reviewed at length with the patient today.  Concerns regarding medicines are outlined above.  Orders Placed This Encounter  Procedures   Basic metabolic panel   CBC   EKG 12-Lead   No orders of the defined types were placed in this encounter.   Patient Instructions  Medication Instructions:  Your Physician recommend you continue on your current medication as directed.    *If you need a refill on your cardiac medications before your next appointment, please call your pharmacy*   Lab Work: Please return for Lab work the week of 2/19 for BMP, CBC . You may come to the...   [facility] (3rd floor) [address] [place], [place] [address]  Open: 8am-Noon and 1pm-4:30pm  Please ring the doorbell on the small table when you exit the elevator and the Lab Tech will come get you  [facility] [address] [place], [place], [place] [address] Open:  8am-1pm, then 2pm-4:30pm   [place]- Please see attached locations sheet stapled to your lab work with address and hours.    If you have labs (blood work) drawn today and your tests are completely normal, you will receive your  results only by: MyChart Message (if you have MyChart) OR A paper copy in the mail If you have any lab test that is abnormal or we need to change your treatment, we will call you to review the results.   Testing/Procedures: You are scheduled for a Cardioversion on Wednesday, February 28 with Dr. Gilman Schmidt.  Please arrive at the [facility] (Main Entrance A) at [facility]: [address] [place], [place] [address] at 7:00 AM.   DIET:  Nothing to eat or drink after midnight except a sip of water with medications (see medication instructions below)  MEDICATION INSTRUCTIONS: Continue taking your anticoagulant (blood thinner): Apixaban (Eliquis).  You will need to continue this after your procedure until you are told by your provider that it is safe to stop.    LABS: Week of 2/19 for BMP and CBC   FYI:  For your safety, and to allow [place] to monitor your vital signs accurately during the surgery/procedure we request: If you have artificial nails, gel coating, SNS etc, please have those removed prior to your surgery/procedure. Not having the nail coverings /polish removed may result in cancellation or delay of your surgery/procedure.  You must have a responsible person to drive you home and stay in the waiting area during your procedure. Failure to do so could result in cancellation.  Bring your insurance cards.  *Special Note: Every effort is made to have your procedure done on time. Occasionally there are emergencies that occur at the hospital that may cause delays. Please be patient if a delay does occur.   Follow-Up: At [facility], you and your health needs are our priority.  As part of our continuing mission to provide you with exceptional heart  care, we have created designated Provider Care Teams.  These Care Teams include your primary Cardiologist (physician) and Advanced Practice Providers (APPs -  Physician Assistants and Nurse Practitioners) who all work together to provide you with the care you need, when you need it.  We recommend signing up for the patient portal called "MyChart".  Sign up information is provided on this After Visit Summary.  MyChart is used to connect with patients for Virtual Visits (Telemedicine).  Patients are able to view lab/test results, encounter notes, upcoming appointments, etc.  Non-urgent messages can be sent to your provider as well.   To learn more about what you can do with MyChart, go to [email or web address].    Your next appointment:   2 week(s) post cardioversion   Provider:   Skeet Latch, MD or Laurann Montana, NP    Other Instructions Heart Healthy Diet Recommendations: A low-salt diet is recommended. Meats should be grilled, baked, or boiled. Avoid fried foods. Focus on lean protein sources like fish or chicken with vegetables and fruits. The American Heart Association is a Microbiologist!  American Heart Association Diet and Lifeystyle Recommendations   Exercise recommendations: The American Heart Association recommends 150 minutes of moderate intensity exercise weekly. Try 30 minutes of moderate intensity exercise 4-5 times per week. This could include walking, jogging, or swimming.     Signed, Trudi Ida, NP  01/06/2023 4:20 PM    [place]

## 2023-01-05 NOTE — H&P (View-Only) (Signed)
Cardiology Office Note:    Date:  01/06/2023   ID:  Ivonne Andrew, DOB 1966/11/09, MRN [id number]  PCP:  Marda Stalker, [place] Providers Cardiologist:  Skeet Latch, MD     Referring MD: Marda Stalker, PA-C   Chief Complaint  Patient presents with   follow up for atrial fibrillation    History of Present Illness:    [name] is a 57 y.o. female with a hx of hypertension, aortic stenosis, morbid obesity, fibrillation (anticoagulated on Eliquis).  She was last seen in our office on 12/23/2022, at that time she was presenting for her routine follow-up, had had an echo a few days prior for her aortic stenosis.  Echo revealed that she was in atrial fibrillation.  Upon arrival to our office twelve-lead confirmed she was in atrial fibrillation.  She had no associated symptoms and was unaware that she was in atrial fibrillation.  CHA2DS2-VASc score was 2, she was started on Eliquis 5 mg twice a day. [facility] home sleep study was arranged.  CBC, bmet, TSH, mag were checked and were unremarkable.  She presents today for follow up of her atrial fibrillation. Unfortunately, she is still in atrial fibrillation. She is feeling better, than her previous visit. She has been taking her Eliquis twice daily, without any missed doses. She denies chest pain, palpitations, dyspnea, pnd, orthopnea, n, v, dizziness, syncope, edema, weight gain, or early satiety. Denies hematuria, hematochezia, hemoptysis. She does wear a pad for overnight incontinence, she has noticed scant blood over the last few days in the am. She feels it could be an abscess of some type.   Past Medical History:  Diagnosis Date   Anemia    Anxiety    Aortic stenosis 11/04/2021   Arthritis    knees    Atrial fibrillation (HCC)    Depression    Headache    hx of migraines    History of hiatal hernia    Hypertension    LVH (left ventricular hypertrophy) due to hypertensive disease 11/2022    Morbid obesity with BMI of 60.0-69.9, adult ([place])    BMI 62   Murmur 08/15/2017   Pneumonia 07/18/2011   Preoperative evaluation of a medical condition to rule out surgical contraindications (TAR required) 11/04/2021    Past Surgical History:  Procedure Laterality Date   APPENDECTOMY     BREAST BIOPSY Left 09/14/2020   BREAST BIOPSY Right 10/14/2022   [place] RT BREAST BX W LOC DEV 1ST LESION IMG BX SPEC US GUIDE 10/14/2022 [facility]   CARPAL TUNNEL RELEASE Right 11/23/2021   Procedure: CARPAL TUNNEL RELEASE;  Surgeon: Earlie Server, MD;  Location: [facility];  Service: Orthopedics;  Laterality: Right;   CESAREAN SECTION     CHOLECYSTECTOMY     LAPAROSCOPIC ROUX-EN-Y GASTRIC BYPASS WITH HIATAL HERNIA REPAIR N/A 12/30/2014   Procedure: LAPAROSCOPIC ROUX-EN-Y GASTRIC BYPASS WITH HIATAL HERNIA REPAIR AND UPPER ENDOSCOPY;  Surgeon: Gayland Curry, MD;  Location: [facility];  Service: General;  Laterality: N/A;   TONSILLECTOMY      Current Medications: Current Meds  Medication Sig   apixaban (ELIQUIS) 5 MG TABS tablet Take 1 tablet (5 mg total) by mouth 2 (two) times daily.   Cholecalciferol (VITAMIN D3) 5000 UNITS TABS Take 1 tablet by mouth in the morning.   Cyanocobalamin (VITAMIN B-12 PO) Take 1 tablet by mouth in the morning.   diphenhydramine-acetaminophen (TYLENOL PM) 25-500 MG TABS Take 1 tablet by mouth at  bedtime as needed (sleep).   FLUoxetine (PROZAC) 20 MG capsule Take 3 capsules (60 mg total) by mouth daily.   LORazepam (ATIVAN) 0.5 MG tablet TAKE 1 TABLET(0.5 MG) BY MOUTH EVERY 8 HOURS AS NEEDED FOR ANXIETY   metoprolol tartrate (LOPRESSOR) 50 MG tablet Take 1 tablet (50 mg total) by mouth 2 (two) times daily.   Multiple Vitamin (MULTIVITAMIN WITH MINERALS) TABS tablet Take 1 tablet by mouth in the morning and at bedtime.   NORLYDA 0.35 MG tablet Take 1 tablet by mouth every evening.   triamterene-hydrochlorothiazide (MAXZIDE-25) 37.5-25 MG tablet TAKE 1 TABLET BY MOUTH DAILY      Allergies:   Darvocet [propoxyphene n-acetaminophen] and Penicillins   Social History   Socioeconomic History   Marital status: Married    Spouse name: Not on file   Number of children: Not on file   Years of education: Not on file   Highest education level: Not on file  Occupational History   Not on file  Tobacco Use   Smoking status: Never   Smokeless tobacco: Never  Vaping Use   Vaping Use: Never used  Substance and Sexual Activity   Alcohol use: No   Drug use: No   Sexual activity: Yes    Birth control/protection: Pill    Comment: azurette  Other Topics Concern   Not on file  Social History Narrative   Not on file   Social Determinants of Health   Financial Resource Strain: Not on file  Food Insecurity: Not on file  Transportation Needs: Not on file  Physical Activity: Not on file  Stress: Not on file  Social Connections: Not on file     Family History: The patient's family history includes Breast cancer in her maternal grandmother; Cancer in her father and paternal grandmother; Colon cancer in her father; Diabetes in her father, paternal aunt, and paternal uncle; Heart attack in her maternal grandfather; Heart disease in her maternal grandfather; Hypertension in her brother, father, and mother; Mental illness in her paternal aunt; Stroke in her paternal aunt.  ROS:   Please see the history of present illness.    All other systems reviewed and are negative.  EKGs/Labs/Other Studies Reviewed:    The following studies were reviewed today:   EKG:  EKG is  ordered today.  The ekg ordered today demonstrates Atrial fibrillation, incomplete RBBB, HR 94 bpm.   Recent Labs: 12/23/2022: BUN 19; Creatinine, Ser 0.80; Hemoglobin 15.1; Magnesium 2.3; Platelets 317; Potassium 4.5; Sodium 140; TSH 1.430  Recent Lipid Panel    Component Value Date/Time   CHOL 181 11/17/2021 1054   CHOL 202 (H) 12/11/2020 1122   TRIG 79 11/17/2021 1054   HDL 60 11/17/2021 1054    HDL 54 12/11/2020 1122   CHOLHDL 3.0 11/17/2021 1054   VLDL 16 11/17/2021 1054   LDLCALC 105 (H) 11/17/2021 1054   LDLCALC 125 (H) 12/11/2020 1122     Risk Assessment/Calculations:    CHA2DS2-VASc Score = 2   This indicates a 2.2% annual risk of stroke. The patient's score is based upon: CHF History: 0 HTN History: 1 Diabetes History: 0 Stroke History: 0 Vascular Disease History: 0 Age Score: 0 Gender Score: 1           STOP-Bang Score:  4       Physical Exam:    VS:  BP 122/78   Pulse 94   Ht '5\' 9"'$  (1.753 m)   Wt (!) 413 lb (187.3 kg)  BMI 60.99 kg/m     Wt Readings from Last 3 Encounters:  01/06/23 (!) 413 lb (187.3 kg)  12/23/22 (!) 417 lb 3.2 oz (189.2 kg)  11/23/21 (!) 420 lb (190.5 kg)     GEN:  Well nourished, well developed in no acute distress HEENT: Normal NECK: No JVD; No carotid bruits LYMPHATICS: No lymphadenopathy CARDIAC: irregular rhythm, no murmurs, rubs, gallops RESPIRATORY:  Clear to auscultation without rales, wheezing or rhonchi  ABDOMEN: Soft, non-tender, non-distended MUSCULOSKELETAL:  No edema; No deformity  SKIN: Warm and dry NEUROLOGIC:  Alert and oriented x 3 PSYCHIATRIC:  Normal affect   ASSESSMENT:    1. Atrial fibrillation, unspecified type ([place])   2. Aortic valve stenosis, etiology of cardiac valve disease unspecified   3. Essential hypertension    PLAN:    In order of problems listed above:  Atrial fibrillation - AF rate controlled on EKG today. Discussed DCCV, she would like to proceed with cardioversion. Continue Eliquis 5 mg BID (no indication for dose reduction)--stressed importance of not missing any doses and reporting to the office if she inadvertently misses any doses. Continue metoprolol 50 mg BID. CHA2DS2-VASc Score = 2 [CHF History: 0, HTN History: 1, Diabetes History: 0, Stroke History: 0, Vascular Disease History: 0, Age Score: 0, Gender Score: 1].  Therefore, the patient's annual risk of stroke is 2.2 %.  She has noticed scant blood on her pad in the am, no overt bleeding. Will check CBC in preparation for her DCCV, also to determine if any anemia is present. Check BMET.  Aortic Valve Stenosis - Mild on most recent echo on 12/21/22, continue optimal BP control.  HTN - BP today 122/78, well controlled, continue current antihypertensive medication regimen.   Disposition - check CBC, BMET in preparation for DCCV, scheduled for 01/25/23. Return 2 weeks after cardioversion.   Shared Decision Making/Informed Consent The risks (stroke, cardiac arrhythmias rarely resulting in the need for a temporary or permanent pacemaker, skin irritation or burns and complications associated with conscious sedation including aspiration, arrhythmia, respiratory failure and death), benefits (restoration of normal sinus rhythm) and alternatives of a direct current cardioversion were explained in detail to [name] and she agrees to proceed.      Medication Adjustments/Labs and Tests Ordered: Current medicines are reviewed at length with the patient today.  Concerns regarding medicines are outlined above.  Orders Placed This Encounter  Procedures   Basic metabolic panel   CBC   EKG 12-Lead   No orders of the defined types were placed in this encounter.   Patient Instructions  Medication Instructions:  Your Physician recommend you continue on your current medication as directed.    *If you need a refill on your cardiac medications before your next appointment, please call your pharmacy*   Lab Work: Please return for Lab work the week of 2/19 for BMP, CBC . You may come to the...   [facility] (3rd floor) [address] [place], [place] [address]  Open: 8am-Noon and 1pm-4:30pm  Please ring the doorbell on the small table when you exit the elevator and the Lab Tech will come get you  [facility] at [facility] [address] [place], [place], [place] [address] Open:  8am-1pm, then 2pm-4:30pm   [place]- Please see attached locations sheet stapled to your lab work with address and hours.    If you have labs (blood work) drawn today and your tests are completely normal, you will receive your  results only by: MyChart Message (if you have MyChart) OR A paper copy in the mail If you have any lab test that is abnormal or we need to change your treatment, we will call you to review the results.   Testing/Procedures: You are scheduled for a Cardioversion on Wednesday, February 28 with Dr. Gilman Schmidt.  Please arrive at the [facility] (Main Entrance A) at [facility]: [address] [place], [place] [address] at 7:00 AM.   DIET:  Nothing to eat or drink after midnight except a sip of water with medications (see medication instructions below)  MEDICATION INSTRUCTIONS: Continue taking your anticoagulant (blood thinner): Apixaban (Eliquis).  You will need to continue this after your procedure until you are told by your provider that it is safe to stop.    LABS: Week of 2/19 for BMP and CBC   FYI:  For your safety, and to allow [place] to monitor your vital signs accurately during the surgery/procedure we request: If you have artificial nails, gel coating, SNS etc, please have those removed prior to your surgery/procedure. Not having the nail coverings /polish removed may result in cancellation or delay of your surgery/procedure.  You must have a responsible person to drive you home and stay in the waiting area during your procedure. Failure to do so could result in cancellation.  Bring your insurance cards.  *Special Note: Every effort is made to have your procedure done on time. Occasionally there are emergencies that occur at the hospital that may cause delays. Please be patient if a delay does occur.   Follow-Up: At [facility], you and your health needs are our priority.  As part of our continuing mission to provide you with exceptional heart  care, we have created designated Provider Care Teams.  These Care Teams include your primary Cardiologist (physician) and Advanced Practice Providers (APPs -  Physician Assistants and Nurse Practitioners) who all work together to provide you with the care you need, when you need it.  We recommend signing up for the patient portal called "MyChart".  Sign up information is provided on this After Visit Summary.  MyChart is used to connect with patients for Virtual Visits (Telemedicine).  Patients are able to view lab/test results, encounter notes, upcoming appointments, etc.  Non-urgent messages can be sent to your provider as well.   To learn more about what you can do with MyChart, go to [email or web address].    Your next appointment:   2 week(s) post cardioversion   Provider:   Skeet Latch, MD or Laurann Montana, NP    Other Instructions Heart Healthy Diet Recommendations: A low-salt diet is recommended. Meats should be grilled, baked, or boiled. Avoid fried foods. Focus on lean protein sources like fish or chicken with vegetables and fruits. The American Heart Association is a Microbiologist!  American Heart Association Diet and Lifeystyle Recommendations   Exercise recommendations: The American Heart Association recommends 150 minutes of moderate intensity exercise weekly. Try 30 minutes of moderate intensity exercise 4-5 times per week. This could include walking, jogging, or swimming.     Signed, Trudi Ida, NP  01/06/2023 4:20 PM    [place]

## 2023-01-06 ENCOUNTER — Ambulatory Visit (HOSPITAL_BASED_OUTPATIENT_CLINIC_OR_DEPARTMENT_OTHER): Payer: Federal, State, Local not specified - PPO | Admitting: Cardiology

## 2023-01-06 ENCOUNTER — Encounter (HOSPITAL_BASED_OUTPATIENT_CLINIC_OR_DEPARTMENT_OTHER): Payer: Self-pay | Admitting: Cardiology

## 2023-01-06 VITALS — BP 122/78 | HR 94 | Ht 69.0 in | Wt >= 6400 oz

## 2023-01-06 DIAGNOSIS — I4891 Unspecified atrial fibrillation: Secondary | ICD-10-CM | POA: Diagnosis not present

## 2023-01-06 DIAGNOSIS — I10 Essential (primary) hypertension: Secondary | ICD-10-CM | POA: Diagnosis not present

## 2023-01-06 DIAGNOSIS — I350 Nonrheumatic aortic (valve) stenosis: Secondary | ICD-10-CM | POA: Diagnosis not present

## 2023-01-06 NOTE — Patient Instructions (Addendum)
Medication Instructions:  Your Physician recommend you continue on your current medication as directed.    *If you need a refill on your cardiac medications before your next appointment, please call your pharmacy*   Lab Work: Please return for Lab work the week of 2/19 for BMP, CBC . You may come to the...   [facility] (3rd floor) [address], [place], [place] [address]  Open: 8am-Noon and 1pm-4:30pm  Please ring the doorbell on the small table when you exit the elevator and the Lab Tech will come get you  [facility] at [facility] [address] [place], [place], [place] [address] Open: 8am-1pm, then 2pm-4:30pm   [facility]- Please see attached locations sheet stapled to your lab work with address and hours.    If you have labs (blood work) drawn today and your tests are completely normal, you will receive your results only by: [place] (if you have MyChart) OR A paper copy in the mail If you have any lab test that is abnormal or we need to change your treatment, we will call you to review the results.   Testing/Procedures: You are scheduled for a Cardioversion on Wednesday, February 28 with Dr. Gilman Schmidt.  Please arrive at the [facility] (Main Entrance A) at [facility]: [address] [place],  [address] at 7:00 AM.   DIET:  Nothing to eat or drink after midnight except a sip of water with medications (see medication instructions below)  MEDICATION INSTRUCTIONS: Continue taking your anticoagulant (blood thinner): Apixaban (Eliquis).  You will need to continue this after your procedure until you are told by your provider that it is safe to stop.    LABS: Week of 2/19 for BMP and CBC   FYI:  For your safety, and to allow [place] to monitor your vital signs accurately during the surgery/procedure we request: If you have artificial nails, gel coating, SNS etc, please have those removed prior to your surgery/procedure.  Not having the nail coverings /polish removed may result in cancellation or delay of your surgery/procedure.  You must have a responsible person to drive you home and stay in the waiting area during your procedure. Failure to do so could result in cancellation.  Bring your insurance cards.  *Special Note: Every effort is made to have your procedure done on time. Occasionally there are emergencies that occur at the hospital that may cause delays. Please be patient if a delay does occur.   Follow-Up: At [facility], you and your health needs are our priority.  As part of our continuing mission to provide you with exceptional heart care, we have created designated Provider Care Teams.  These Care Teams include your primary Cardiologist (physician) and Advanced Practice Providers (APPs -  Physician Assistants and Nurse Practitioners) who all work together to provide you with the care you need, when you need it.  We recommend signing up for the patient portal called "MyChart".  Sign up information is provided on this After Visit Summary.  MyChart is used to connect with patients for Virtual Visits (Telemedicine).  Patients are able to view lab/test results, encounter notes, upcoming appointments, etc.  Non-urgent messages can be sent to your provider as well.   To learn more about what you can do with MyChart, go to [email or web address].    Your next appointment:   2 week(s) post cardioversion   Provider:   Skeet Latch, MD or Laurann Montana, NP    Other  Instructions Heart Healthy Diet Recommendations: A low-salt diet is recommended. Meats should be grilled, baked, or boiled. Avoid fried foods. Focus on lean protein sources like fish or chicken with vegetables and fruits. The American Heart Association is a Microbiologist!  American Heart Association Diet and Lifeystyle Recommendations   Exercise recommendations: The American Heart Association recommends 150 minutes of moderate  intensity exercise weekly. Try 30 minutes of moderate intensity exercise 4-5 times per week. This could include walking, jogging, or swimming.

## 2023-01-08 ENCOUNTER — Encounter (INDEPENDENT_AMBULATORY_CARE_PROVIDER_SITE_OTHER): Payer: Federal, State, Local not specified - PPO | Admitting: Cardiology

## 2023-01-08 DIAGNOSIS — G4733 Obstructive sleep apnea (adult) (pediatric): Secondary | ICD-10-CM

## 2023-01-09 ENCOUNTER — Ambulatory Visit: Payer: Federal, State, Local not specified - PPO | Attending: Cardiology

## 2023-01-09 DIAGNOSIS — R0683 Snoring: Secondary | ICD-10-CM

## 2023-01-09 DIAGNOSIS — I4891 Unspecified atrial fibrillation: Secondary | ICD-10-CM

## 2023-01-09 DIAGNOSIS — G4730 Sleep apnea, unspecified: Secondary | ICD-10-CM

## 2023-01-09 NOTE — Procedures (Signed)
SLEEP STUDY REPORT Patient Information Study Date: 01/09/2023 Patient Name: [name] Patient ID: [id number] Birth Date: Aug 08, 1966 Age: 57 Gender: Female BMI: 61.7 (W=416 lb, H=5' 9'') Referring Physician: Laurann Montana, NP  TEST DESCRIPTION: Home sleep apnea testing was completed using the WatchPat, a Type 1 device, utilizing peripheral arterial tonometry (PAT), chest movement, actigraphy, pulse oximetry, pulse rate, body position and snore.  AHI was calculated with apnea and hypopnea using valid sleep time as the denominator. RDI includes apneas, hypopneas, and RERAs.  The data acquired and the scoring of sleep and all associated events were performed in accordance with the recommended standards and specifications as outlined in the AASM Manual for the Scoring of Sleep and Associated Events 2.2.0 (2015).  FINDINGS:  1.  Severe Obstructive Sleep Apnea with AHI 32.7/hr.   2.  No Central Sleep Apnea with pAHIc 1.8/hr.  3.  Oxygen desaturations as low as 87%.  4.  Severe snoring was present. O2 sats were < 88% for 0.9 min.  5.  Total sleep time was 7 hrs and 18 min.  6.  22.2% of total sleep time was spent in REM sleep.   7.  Normal sleep onset latency at 17 min.   8.  Shortened REM sleep onset latency at 73 min.   9.  Total awakenings were 10.  10. Arrhythmia detection:  Suggestive of possible brief atrial fibrillation lasting 7hrs 23 min and 51 seconds.  This is not diagnostic and further testing with outpatient telemetry monitoring is recommended.   DIAGNOSIS:   Severe Obstructive Sleep Apnea (G47.33)  RECOMMENDATIONS:   1.  Clinical correlation of these findings is necessary.  The decision to treat obstructive sleep apnea (OSA) is usually based on the presence of apnea symptoms or the presence of associated medical conditions such as Hypertension, Congestive Heart Failure, Atrial Fibrillation or Obesity.  The most common symptoms of OSA are snoring, gasping for breath while  sleeping, daytime sleepiness and fatigue.   2.  Initiating apnea therapy is recommended given the presence of symptoms and/or associated conditions. Recommend proceeding with one of the following:     a.  Auto-CPAP therapy with a pressure range of 5-20cm H2O.     b.  An oral appliance (OA) that can be obtained from certain dentists with expertise in sleep medicine.  These are primarily of use in non-obese patients with mild and moderate disease.     c.  An ENT consultation which may be useful to look for specific causes of obstruction and possible treatment options.     d.  If patient is intolerant to PAP therapy, consider referral to ENT for evaluation for hypoglossal nerve stimulator.   3.  Close follow-up is necessary to ensure success with CPAP or oral appliance therapy for maximum benefit.  4.  A follow-up oximetry study on CPAP is recommended to assess the adequacy of therapy and determine the need for supplemental oxygen or the potential need for Bi-level therapy.  An arterial blood gas to determine the adequacy of baseline ventilation and oxygenation should also be considered.  5.  Healthy sleep recommendations include:  adequate nightly sleep (normal 7-9 hrs/night), avoidance of caffeine after noon and alcohol near bedtime, and maintaining a sleep environment that is cool, dark and quiet.  6.  Weight loss for overweight patients is recommended.  Even modest amounts of weight loss can significantly improve the severity of sleep apnea.  7.  Snoring recommendations include:  weight loss where appropriate, side  sleeping, and avoidance of alcohol before bed.  8.  Operation of motor vehicle should be avoided when sleepy.  9.  Consider outpatient heart monitor to assess for atrial fibrillation if clinically indicated.  Signature: Fransico Him, MD; [facility]; [place], [place] Board of Sleep Medicine Electronically Signed: 01/09/2023

## 2023-01-16 ENCOUNTER — Encounter (HOSPITAL_BASED_OUTPATIENT_CLINIC_OR_DEPARTMENT_OTHER): Payer: Self-pay

## 2023-01-16 DIAGNOSIS — I4891 Unspecified atrial fibrillation: Secondary | ICD-10-CM | POA: Diagnosis not present

## 2023-01-16 DIAGNOSIS — I350 Nonrheumatic aortic (valve) stenosis: Secondary | ICD-10-CM | POA: Diagnosis not present

## 2023-01-16 DIAGNOSIS — M170 Bilateral primary osteoarthritis of knee: Secondary | ICD-10-CM | POA: Diagnosis not present

## 2023-01-16 LAB — CBC
Hematocrit: 47.4 % — ABNORMAL HIGH (ref 34.0–46.6)
Hemoglobin: 15 g/dL (ref 11.1–15.9)
MCH: 28.1 pg (ref 26.6–33.0)
MCHC: 31.6 g/dL (ref 31.5–35.7)
MCV: 89 fL (ref 79–97)
Platelets: 341 x10E3/uL (ref 150–450)
RBC: 5.33 x10E6/uL — ABNORMAL HIGH (ref 3.77–5.28)
RDW: 13.3 % (ref 11.7–15.4)
WBC: 11.1 x10E3/uL — ABNORMAL HIGH (ref 3.4–10.8)

## 2023-01-16 LAB — BASIC METABOLIC PANEL WITH GFR
BUN/Creatinine Ratio: 25 — ABNORMAL HIGH (ref 9–23)
BUN: 21 mg/dL (ref 6–24)
CO2: 21 mmol/L (ref 20–29)
Calcium: 9.4 mg/dL (ref 8.7–10.2)
Chloride: 101 mmol/L (ref 96–106)
Creatinine, Ser: 0.83 mg/dL (ref 0.57–1.00)
Glucose: 86 mg/dL (ref 70–99)
Potassium: 4 mmol/L (ref 3.5–5.2)
Sodium: 140 mmol/L (ref 134–144)
eGFR: 82 mL/min/1.73

## 2023-01-16 NOTE — Telephone Encounter (Signed)
Please advise 

## 2023-01-16 NOTE — Telephone Encounter (Signed)
Addressed via separate MyChart encounter.   Loel Dubonnet, NP

## 2023-01-19 ENCOUNTER — Telehealth: Payer: Self-pay | Admitting: *Deleted

## 2023-01-19 DIAGNOSIS — G4730 Sleep apnea, unspecified: Secondary | ICD-10-CM

## 2023-01-19 DIAGNOSIS — R0683 Snoring: Secondary | ICD-10-CM

## 2023-01-19 DIAGNOSIS — G4733 Obstructive sleep apnea (adult) (pediatric): Secondary | ICD-10-CM

## 2023-01-19 NOTE — Telephone Encounter (Signed)
-----   Message from Lauralee Evener, [place] sent at 01/09/2023 11:03 AM EST -----  ----- Message ----- From: Sueanne Margarita, MD Sent: 01/09/2023  10:47 AM EST To: [facility] Sleep Studies  Please let patient know that they have sleep apnea.  Recommend therapeutic CPAP titration for treatment of patient's sleep disordered breathing.  If unable to perform an in lab titration then initiate ResMed auto CPAP from 4 to 15cm H2O with heated humidity and mask of choice and overnight pulse ox on CPAP.

## 2023-01-19 NOTE — Telephone Encounter (Signed)
The patient has been notified of the result and verbalized understanding.  All questions (if any) were answered. Marolyn Hammock, CMA 01/19/2023 [id number] AM    Will precert titration

## 2023-01-20 ENCOUNTER — Other Ambulatory Visit (HOSPITAL_BASED_OUTPATIENT_CLINIC_OR_DEPARTMENT_OTHER): Payer: Self-pay | Admitting: Family Medicine

## 2023-01-20 DIAGNOSIS — R310 Gross hematuria: Secondary | ICD-10-CM

## 2023-01-22 ENCOUNTER — Ambulatory Visit (HOSPITAL_BASED_OUTPATIENT_CLINIC_OR_DEPARTMENT_OTHER)
Admission: RE | Admit: 2023-01-22 | Discharge: 2023-01-22 | Disposition: A | Discharge status: Home / Self Care | Payer: Federal, State, Local not specified - PPO | Source: Ambulatory Visit | Attending: Family Medicine | Admitting: Family Medicine

## 2023-01-22 DIAGNOSIS — R310 Gross hematuria: Secondary | ICD-10-CM | POA: Insufficient documentation

## 2023-01-22 DIAGNOSIS — N200 Calculus of kidney: Secondary | ICD-10-CM | POA: Diagnosis not present

## 2023-01-22 DIAGNOSIS — I700 Atherosclerosis of aorta: Secondary | ICD-10-CM | POA: Diagnosis not present

## 2023-01-22 MED ORDER — IOHEXOL 300 MG/ML  SOLN
125.0000 mL | Freq: Once | INTRAMUSCULAR | Status: AC | PRN
  Administered 2023-01-22: 125 mL via INTRAVENOUS

## 2023-01-25 ENCOUNTER — Other Ambulatory Visit: Payer: Self-pay

## 2023-01-25 ENCOUNTER — Encounter (HOSPITAL_COMMUNITY)
Admission: RE | Disposition: A | Discharge status: Home / Self Care | Payer: Self-pay | Source: Home / Self Care | Attending: Cardiology

## 2023-01-25 ENCOUNTER — Ambulatory Visit (HOSPITAL_COMMUNITY)
Admission: RE | Admit: 2023-01-25 | Discharge: 2023-01-25 | Disposition: A | Discharge status: Home / Self Care | Payer: Federal, State, Local not specified - PPO | Attending: Cardiology | Admitting: Cardiology

## 2023-01-25 ENCOUNTER — Ambulatory Visit (HOSPITAL_COMMUNITY): Payer: Federal, State, Local not specified - PPO | Admitting: Certified Registered"

## 2023-01-25 DIAGNOSIS — I350 Nonrheumatic aortic (valve) stenosis: Secondary | ICD-10-CM | POA: Insufficient documentation

## 2023-01-25 DIAGNOSIS — K449 Diaphragmatic hernia without obstruction or gangrene: Secondary | ICD-10-CM | POA: Diagnosis not present

## 2023-01-25 DIAGNOSIS — I4510 Unspecified right bundle-branch block: Secondary | ICD-10-CM | POA: Insufficient documentation

## 2023-01-25 DIAGNOSIS — M1990 Unspecified osteoarthritis, unspecified site: Secondary | ICD-10-CM | POA: Insufficient documentation

## 2023-01-25 DIAGNOSIS — I10 Essential (primary) hypertension: Secondary | ICD-10-CM | POA: Insufficient documentation

## 2023-01-25 DIAGNOSIS — Z6841 Body Mass Index (BMI) 40.0 and over, adult: Secondary | ICD-10-CM | POA: Insufficient documentation

## 2023-01-25 DIAGNOSIS — F419 Anxiety disorder, unspecified: Secondary | ICD-10-CM | POA: Diagnosis not present

## 2023-01-25 DIAGNOSIS — I517 Cardiomegaly: Secondary | ICD-10-CM | POA: Insufficient documentation

## 2023-01-25 DIAGNOSIS — I4891 Unspecified atrial fibrillation: Secondary | ICD-10-CM | POA: Diagnosis not present

## 2023-01-25 DIAGNOSIS — Z7901 Long term (current) use of anticoagulants: Secondary | ICD-10-CM | POA: Insufficient documentation

## 2023-01-25 DIAGNOSIS — I3481 Nonrheumatic mitral (valve) annulus calcification: Secondary | ICD-10-CM | POA: Diagnosis not present

## 2023-01-25 HISTORY — PX: CARDIOVERSION: SHX1299

## 2023-01-25 SURGERY — CARDIOVERSION
Anesthesia: General

## 2023-01-25 MED ORDER — SODIUM CHLORIDE 0.9 % IV SOLN: INTRAVENOUS | Status: DC

## 2023-01-25 MED ORDER — LIDOCAINE 2% (20 MG/ML) 5 ML SYRINGE
INTRAMUSCULAR | Status: DC | PRN
  Administered 2023-01-25: 40 mg via INTRAVENOUS

## 2023-01-25 MED ORDER — PROPOFOL 10 MG/ML IV BOLUS
INTRAVENOUS | Status: DC | PRN
  Administered 2023-01-25: 70 mg via INTRAVENOUS

## 2023-01-25 NOTE — Anesthesia Preprocedure Evaluation (Signed)
Anesthesia Evaluation  Patient identified by MRN, date of birth, ID band Patient awake    Reviewed: Allergy & Precautions, NPO status , Patient's Chart, lab work & pertinent test results, reviewed documented beta blocker date and time   Airway Mallampati: II  TM Distance: >3 FB Neck ROM: Full    Dental  (+) Teeth Intact, Dental Advisory Given   Pulmonary    breath sounds clear to auscultation       Cardiovascular hypertension, Pt. on home beta blockers and Pt. on medications + dysrhythmias Atrial Fibrillation + Valvular Problems/Murmurs AS  Rhythm:Regular Rate:Normal  Echo:  1. Appears to be in Afib during study. Left ventricular ejection  fraction, by estimation, is 60 to 65%. The left ventricle has normal  function. The left ventricle has no regional wall motion abnormalities.  There is moderate left ventricular hypertrophy.   Left ventricular diastolic parameters are indeterminate.   2. Right ventricular systolic function is normal. The right ventricular  size is mildly enlarged. The estimated right ventricular systolic pressure  is 0000000 mmHg.   3. The mitral valve is degenerative. Trivial mitral valve regurgitation.  Severe mitral annular calcification.   4. The aortic valve was not well visualized. Aortic valve regurgitation  is not visualized. Mild aortic valve stenosis.   5. The inferior vena cava is normal in size with greater than 50%  respiratory variability, suggesting right atrial pressure of 3 mmHg.     Neuro/Psych    GI/Hepatic Neg liver ROS, hiatal hernia,,,  Endo/Other  negative endocrine ROS    Renal/GU negative Renal ROS     Musculoskeletal  (+) Arthritis ,    Abdominal  (+) + obese  Peds  Hematology negative hematology ROS (+)   Anesthesia Other Findings   Reproductive/Obstetrics                             Anesthesia Physical Anesthesia Plan  ASA: 4  Anesthesia  Plan: General   Post-op Pain Management: Minimal or no pain anticipated   Induction: Intravenous  PONV Risk Score and Plan: 0  Airway Management Planned: Natural Airway and Simple Face Mask  Additional Equipment:   Intra-op Plan:   Post-operative Plan:   Informed Consent: I have reviewed the patients History and Physical, chart, labs and discussed the procedure including the risks, benefits and alternatives for the proposed anesthesia with the patient or authorized representative who has indicated his/her understanding and acceptance.       Plan Discussed with: CRNA  Anesthesia Plan Comments:        Anesthesia Quick Evaluation

## 2023-01-25 NOTE — CV Procedure (Signed)
    Electrical Cardioversion Procedure Note [name] [id number] [birth year]  Procedure: Electrical Cardioversion Indications:  Atrial Fibrillation  Time Out: Verified patient identification, verified procedure,medications/allergies/relevent history reviewed, required imaging and test results available.  Performed  Procedure Details  The patient was NPO after midnight. Anesthesia was administered at the beside  by Dr.Hollis with  propofol.  Cardioversion was performed with synchronized biphasic defibrillation via AP pads with 200 joules.  1 attempt(s) were performed.  The patient converted to normal sinus rhythm. The patient tolerated the procedure well   IMPRESSION:  Successful cardioversion of atrial fibrillation    [name] 01/25/2023, 8:21 AM

## 2023-01-25 NOTE — Anesthesia Postprocedure Evaluation (Signed)
Anesthesia Post Note  Patient: [name]  Procedure(s) Performed: CARDIOVERSION     Patient location during evaluation: PACU Anesthesia Type: General Level of consciousness: awake and alert Pain management: pain level controlled Vital Signs Assessment: post-procedure vital signs reviewed and stable Respiratory status: spontaneous breathing, nonlabored ventilation, respiratory function stable and patient connected to nasal cannula oxygen Cardiovascular status: blood pressure returned to baseline and stable Postop Assessment: no apparent nausea or vomiting Anesthetic complications: no  No notable events documented.  Last Vitals:  Vitals:   01/25/23 0840 01/25/23 0851  BP: (!) 142/77 (!) 150/84  Pulse: (!) 54 (!) 54  Resp: 17 20  Temp:    SpO2: 93% 93%    Last Pain:  Vitals:   01/25/23 0851  TempSrc:   PainSc: 0-No pain                 Effie Berkshire

## 2023-01-25 NOTE — Interval H&P Note (Signed)
History and Physical Interval Note:  01/25/2023 8:02 AM  [name]  has presented today for surgery, with the diagnosis of afib.  The various methods of treatment have been discussed with the patient and family. After consideration of risks, benefits and other options for treatment, the patient has consented to  Procedure(s): CARDIOVERSION (N/A) as a surgical intervention.  The patient's history has been reviewed, patient examined, no change in status, stable for surgery.  I have reviewed the patient's chart and labs.  Questions were answered to the patient's satisfaction.     [employer]

## 2023-01-25 NOTE — Transfer of Care (Signed)
Immediate Anesthesia Transfer of Care Note  Patient: [name]  Procedure(s) Performed: CARDIOVERSION  Patient Location: PACU  Anesthesia Type:General  Level of Consciousness: drowsy  Airway & Oxygen Therapy: Patient Spontanous Breathing  Post-op Assessment: Report given to RN and Post -op Vital signs reviewed and stable  Post vital signs: Reviewed and stable  Last Vitals:  Vitals Value Taken Time  BP    Temp    Pulse    Resp    SpO2      Last Pain:  Vitals:   01/25/23 0732  TempSrc: Temporal  PainSc: 3          Complications: No notable events documented.

## 2023-01-25 NOTE — Discharge Instructions (Signed)

## 2023-01-26 ENCOUNTER — Encounter (HOSPITAL_COMMUNITY): Payer: Self-pay | Admitting: Cardiology

## 2023-02-07 ENCOUNTER — Encounter (HOSPITAL_BASED_OUTPATIENT_CLINIC_OR_DEPARTMENT_OTHER): Payer: Self-pay

## 2023-02-07 NOTE — Telephone Encounter (Signed)
Please help patient.

## 2023-02-09 NOTE — Telephone Encounter (Signed)
Pt. Following up with you  

## 2023-02-10 ENCOUNTER — Ambulatory Visit (HOSPITAL_BASED_OUTPATIENT_CLINIC_OR_DEPARTMENT_OTHER): Payer: Federal, State, Local not specified - PPO | Admitting: Family

## 2023-02-10 ENCOUNTER — Other Ambulatory Visit (INDEPENDENT_AMBULATORY_CARE_PROVIDER_SITE_OTHER): Payer: Federal, State, Local not specified - PPO

## 2023-02-10 ENCOUNTER — Encounter (HOSPITAL_BASED_OUTPATIENT_CLINIC_OR_DEPARTMENT_OTHER): Payer: Self-pay | Admitting: Family

## 2023-02-10 VITALS — BP 106/56 | HR 100 | Ht 69.0 in | Wt >= 6400 oz

## 2023-02-10 DIAGNOSIS — I4819 Other persistent atrial fibrillation: Secondary | ICD-10-CM | POA: Diagnosis not present

## 2023-02-10 DIAGNOSIS — D6859 Other primary thrombophilia: Secondary | ICD-10-CM | POA: Diagnosis not present

## 2023-02-10 DIAGNOSIS — I350 Nonrheumatic aortic (valve) stenosis: Secondary | ICD-10-CM

## 2023-02-10 DIAGNOSIS — I10 Essential (primary) hypertension: Secondary | ICD-10-CM

## 2023-02-10 DIAGNOSIS — G4733 Obstructive sleep apnea (adult) (pediatric): Secondary | ICD-10-CM

## 2023-02-10 NOTE — Progress Notes (Signed)
Office Visit    Patient Name: [name] Date of Encounter: 02/10/2023  PCP:  Marda Stalker, [place]  Cardiologist:  Skeet Latch, MD  Advanced Practice Provider:  No care team member to display Electrophysiologist:  None      Chief Complaint    [name] is a 57 y.o. female presents today for follow up after cardioversion  Past Medical History    Past Medical History:  Diagnosis Date   Anemia    Anxiety    Aortic stenosis 11/04/2021   Arthritis    knees    Atrial fibrillation ([place])    Depression    Headache    hx of migraines    History of hiatal hernia    Hypertension    LVH (left ventricular hypertrophy) due to hypertensive disease 11/2022   Morbid obesity with BMI of 60.0-69.9, adult ([place])    BMI 62   Murmur 08/15/2017   Pneumonia 07/18/2011   Preoperative evaluation of a medical condition to rule out surgical contraindications (TAR required) 11/04/2021   Past Surgical History:  Procedure Laterality Date   APPENDECTOMY     BREAST BIOPSY Left 09/14/2020   BREAST BIOPSY Right 10/14/2022   [place] RT BREAST BX W LOC DEV 1ST LESION IMG BX SPEC US GUIDE 10/14/2022 [facility]   CARDIOVERSION N/A 01/25/2023   Procedure: CARDIOVERSION;  Surgeon: Jerline Pain, MD;  Location: [facility];  Service: Cardiovascular;  Laterality: N/A;   CARPAL TUNNEL RELEASE Right 11/23/2021   Procedure: CARPAL TUNNEL RELEASE;  Surgeon: Earlie Server, MD;  Location: [facility];  Service: Orthopedics;  Laterality: Right;   CESAREAN SECTION     CHOLECYSTECTOMY     LAPAROSCOPIC ROUX-EN-Y GASTRIC BYPASS WITH HIATAL HERNIA REPAIR N/A 12/30/2014   Procedure: LAPAROSCOPIC ROUX-EN-Y GASTRIC BYPASS WITH HIATAL HERNIA REPAIR AND UPPER ENDOSCOPY;  Surgeon: Gayland Curry, MD;  Location: [facility];  Service: General;  Laterality: N/A;   TONSILLECTOMY      Allergies  Allergies  Allergen Reactions   Darvocet [Propoxyphene  N-Acetaminophen] Rash   Penicillins Swelling    History of Present Illness    [name] is a 57 y.o. female with a hx of hypertension, OSA aortic stenosis, morbid obesity, atrial fibrillation last seen 01/25/23 for DCCV with Dr. Marlou Porch.  Echocardiogram 12/21/2022 with new finding of atrial fibrillation.  Her echocardiogram showed normal LVEF and stable mild aortic stenosis.  Seen in clinic 12/23/2022 with asymptomatic rate controlled atrial fibrillation.  Due to CHA2DS2-VASc of 2 she was started on Eliquis 5 mg twice daily.  It [place] home sleep study arranged.  CBC, BMP, TSH, magnesium unremarkable.  At follow-up 01/06/2023 she remained in atrial fibrillation and was set up for cardioversion which was performed 01/25/2023.  Her sleep study did reveal sleep apnea and she was recommended for therapeutic CPAP titration or if not approved to proceed with auto CPAP.  She presents today for follow-up.  Recently enjoyed a trip to a museum dedicated to [employer]. Reports feeling well since her cardioversion. Reports rare palpitations with sensation of hearing her heart beat in her left ear. When discussing atrial fibrillation, notes both her father and mother had it which she was previously unaware of. Her father has been able to return to NSR but her mother has had more persistent issues and is considering PPM. She wonders if her untreated sleep apnea is contributory to atrial fibrillation. Awaiting insurance for CPAP titration  from insurance.   Was having scant amount of blood on her pad. She saw PCP and CT scan revealed kidney stones. She was treated with medication to "flush them out" and bleeding was better but scant amount of blood the past couple of days. Plans to discuss with primary care provider. Recent CBCs have shown no anemia. Has been working on weight loss and focusing on her diet. Has adjusted to more low carbohydrate diet.  Reports no shortness of breath nor dyspnea on exertion.  Reports no chest pain, pressure, or tightness. No edema, orthopnea, PND.   EKGs/Labs/Other Studies Reviewed:   The following studies were reviewed today: Cardiac Studies & Procedures       ECHOCARDIOGRAM  ECHOCARDIOGRAM COMPLETE 12/21/2022  Narrative ECHOCARDIOGRAM REPORT    Patient Name:   ANNIKKA GAZA Date of Exam: 12/21/2022 Medical Rec #:  [id number]          Height:       69.0 in Accession #:    [id number]         Weight:       420.0 lb Date of Birth:  October 13, 1966           BSA:          2.832 m Patient Age:    39 years           BP:           136/76 mmHg Patient Gender: F                  HR:           91 bpm. Exam Location:  Outpatient  Procedure: 2D Echo, 3D Echo, Color Doppler, Cardiac Doppler and Strain Analysis  Indications:    I35.0 Nonrheumatic aortic (valve) stenosis  History:        Patient has prior history of Echocardiogram examinations, most recent 12/17/2021. Signs/Symptoms:Murmur; Risk Factors:Hypertension and Non-Smoker. Pneumonia.  Sonographer:    Leavy Cella RDCS Referring Phys: [id number] [place]  IMPRESSIONS   1. Appears to be in Afib during study. Left ventricular ejection fraction, by estimation, is 60 to 65%. The left ventricle has normal function. The left ventricle has no regional wall motion abnormalities. There is moderate left ventricular hypertrophy. Left ventricular diastolic parameters are indeterminate. 2. Right ventricular systolic function is normal. The right ventricular size is mildly enlarged. The estimated right ventricular systolic pressure is 0000000 mmHg. 3. The mitral valve is degenerative. Trivial mitral valve regurgitation. Severe mitral annular calcification. 4. The aortic valve was not well visualized. Aortic valve regurgitation is not visualized. Mild aortic valve stenosis. 5. The inferior vena cava is normal in size with greater than 50% respiratory variability, suggesting right atrial pressure of 3  mmHg.  FINDINGS Left Ventricle: Left ventricular ejection fraction, by estimation, is 60 to 65%. The left ventricle has normal function. The left ventricle has no regional wall motion abnormalities. The left ventricular internal cavity size was normal in size. There is moderate left ventricular hypertrophy. Left ventricular diastolic parameters are indeterminate.  Right Ventricle: The right ventricular size is mildly enlarged. No increase in right ventricular wall thickness. Right ventricular systolic function is normal. The tricuspid regurgitant velocity is 2.58 m/s, and with an assumed right atrial pressure of 3 mmHg, the estimated right ventricular systolic pressure is 0000000 mmHg.  Left Atrium: Left atrial size was normal in size.  Right Atrium: Right atrial size was normal in size.  Pericardium: There is no evidence of pericardial  effusion.  Mitral Valve: The mitral valve is degenerative in appearance. Severe mitral annular calcification. Trivial mitral valve regurgitation.  Tricuspid Valve: The tricuspid valve is normal in structure. Tricuspid valve regurgitation is trivial.  Aortic Valve: The aortic valve was not well visualized. Aortic valve regurgitation is not visualized. Mild aortic stenosis is present. Aortic valve mean gradient measures 10.0 mmHg. Aortic valve peak gradient measures 17.5 mmHg. Aortic valve area, by VTI measures 1.30 cm.  Pulmonic Valve: The pulmonic valve was not well visualized. Pulmonic valve regurgitation is not visualized.  Aorta: The aortic root and ascending aorta are structurally normal, with no evidence of dilitation.  Venous: The inferior vena cava is normal in size with greater than 50% respiratory variability, suggesting right atrial pressure of 3 mmHg.  IAS/Shunts: The interatrial septum was not well visualized.   LEFT VENTRICLE PLAX 2D LVIDd:         5.07 cm   Diastology LVIDs:         2.17 cm   LV e' medial:    9.57 cm/s LV PW:          1.65 cm   LV E/e' medial:  14.0 LV IVS:        1.02 cm   LV e' lateral:   9.57 cm/s LVOT diam:     2.00 cm   LV E/e' lateral: 14.0 LV SV:         46 LV SV Index:   16 LVOT Area:     3.14 cm  3D Volume EF: 3D EF:        47 % LV EDV:       162 ml LV ESV:       87 ml LV SV:        76 ml  RIGHT VENTRICLE RV Basal diam:  4.29 cm RV Mid diam:    4.65 cm RV S prime:     14.40 cm/s TAPSE (M-mode): 3.1 cm  LEFT ATRIUM             Index        RIGHT ATRIUM           Index LA diam:        5.50 cm 1.94 cm/m   RA Area:     12.80 cm LA Vol (A2C):   81.3 ml 28.71 ml/m  RA Volume:   28.80 ml  10.17 ml/m LA Vol (A4C):   62.0 ml 21.89 ml/m LA Biplane Vol: 77.3 ml 27.30 ml/m AORTIC VALVE AV Area (Vmax):    1.45 cm AV Area (Vmean):   1.33 cm AV Area (VTI):     1.30 cm AV Vmax:           209.00 cm/s AV Vmean:          146.000 cm/s AV VTI:            0.354 m AV Peak Grad:      17.5 mmHg AV Mean Grad:      10.0 mmHg LVOT Vmax:         96.60 cm/s LVOT Vmean:        61.900 cm/s LVOT VTI:          0.147 m LVOT/AV VTI ratio: 0.42  AORTA Ao Root diam: 2.60 cm Ao Asc diam:  3.10 cm  MITRAL VALVE                TRICUSPID VALVE MV Area (PHT): 2.56 cm  TR Peak grad:   26.6 mmHg MV Decel Time: 296 msec     TR Vmax:        258.00 cm/s MV E velocity: 134.00 cm/s SHUNTS Systemic VTI:  0.15 m Systemic Diam: 2.00 cm  Oswaldo Milian MD Electronically signed by Oswaldo Milian MD Signature Date/Time: 12/21/2022/6:16:59 PM    Final              EKG:  EKG is  ordered today.  The ekg ordered today demonstrates atrial fibrillation 100 bpm with no acute ST/T wave changes.   Recent Labs: 12/23/2022: Magnesium 2.3; TSH 1.430 01/16/2023: BUN 21; Creatinine, Ser 0.83; Hemoglobin 15.0; Platelets 341; Potassium 4.0; Sodium 140  Recent Lipid Panel    Component Value Date/Time   CHOL 181 11/17/2021 1054   CHOL 202 (H) 12/11/2020 1122   TRIG 79 11/17/2021 1054   HDL 60  11/17/2021 1054   HDL 54 12/11/2020 1122   CHOLHDL 3.0 11/17/2021 1054   VLDL 16 11/17/2021 1054   LDLCALC 105 (H) 11/17/2021 1054   LDLCALC 125 (H) 12/11/2020 1122    Risk Assessment/Calculations:   CHA2DS2-VASc Score = 2   This indicates a 2.2% annual risk of stroke. The patient's score is based upon: CHF History: 0 HTN History: 1 Diabetes History: 0 Stroke History: 0 Vascular Disease History: 0 Age Score: 0 Gender Score: 1    Home Medications   Current Meds  Medication Sig   apixaban (ELIQUIS) 5 MG TABS tablet Take 1 tablet (5 mg total) by mouth 2 (two) times daily.   Cholecalciferol (VITAMIN D3) 5000 UNITS TABS Take 5,000 Units by mouth in the morning.   cyanocobalamin (VITAMIN B12) 1000 MCG tablet Take 1,000 mcg by mouth in the morning.   diphenhydramine-acetaminophen (TYLENOL PM) 25-500 MG TABS Take 2 tablets by mouth at bedtime.   FLUoxetine (PROZAC) 20 MG capsule Take 3 capsules (60 mg total) by mouth daily.   LORazepam (ATIVAN) 0.5 MG tablet TAKE 1 TABLET(0.5 MG) BY MOUTH EVERY 8 HOURS AS NEEDED FOR ANXIETY   metoprolol tartrate (LOPRESSOR) 50 MG tablet Take 1 tablet (50 mg total) by mouth 2 (two) times daily.   Multiple Vitamin (MULTIVITAMIN WITH MINERALS) TABS tablet Take 1 tablet by mouth in the morning and at bedtime.   nitrofurantoin, macrocrystal-monohydrate, (MACROBID) 100 MG capsule Take 100 mg by mouth 2 (two) times daily. Med should be finished 01/26/23   NORLYDA 0.35 MG tablet Take 1 tablet by mouth every evening.   tamsulosin (FLOMAX) 0.4 MG CAPS capsule Take 0.4 mg by mouth daily.   triamterene-hydrochlorothiazide (MAXZIDE-25) 37.5-25 MG tablet TAKE 1 TABLET BY MOUTH DAILY     Review of Systems     All other systems reviewed and are otherwise negative except as noted above.  Physical Exam    VS:  BP (!) 106/56 (BP Location: Right Arm, Patient Position: Sitting, Cuff Size: Large)   Pulse 100   Ht 5\' 9"  (1.753 m)   Wt (!) 406 lb 4.8 oz (184.3 kg)    BMI 60.00 kg/m  , BMI Body mass index is 60 kg/m.  Wt Readings from Last 3 Encounters:  02/10/23 (!) 406 lb 4.8 oz (184.3 kg)  01/25/23 (!) 403 lb (182.8 kg)  01/06/23 (!) 413 lb (187.3 kg)    GEN: Well nourished, overweight, well developed, in no acute distress. HEENT: normal. Neck: Supple, no JVD, carotid bruits, or masses. Cardiac: IRIR, no murmurs, rubs, or gallops. No clubbing, cyanosis, edema.  Radials/PT 2+  and equal bilaterally.  Respiratory:  Respirations regular and unlabored, clear to auscultation bilaterally. GI: Soft, nontender, nondistended. MS: No deformity or atrophy. Skin: Warm and dry, no rash. Neuro:  Strength and sensation are intact. Psych: Normal affect.  Assessment & Plan    Atrial fibrillation/hypercoagulable state - status post DCCV 01/25/23. EKG today shows she has returned to atrial fibrillation. She is overall asymptomatic with no palpitations. Notes occasionally will sense fater heart rate and heart beating in her ears but overall asymptomatic in regards to atrial fibrillation.  14-day ZIO monitor placed today to assess burden.  We will plan to treat her sleep apnea, work on weight loss prior to referral to EP team per her request.  Could consider rate control strategy. CHA2DS2-VASc Score = 2 [CHF History: 0, HTN History: 1, Diabetes History: 0, Stroke History: 0, Vascular Disease History: 0, Age Score: 0, Gender Score: 1].  Therefore, the patient's annual risk of stroke is 2.2 %.    Continue Eliquis 5 mg twice daily.  Does not meet dose reduction criteria.  OSA- Awaiting determination on CPAP titration study from insurance.  Anticipate contributory to atrial fibrillation.  Obesity - Weight loss via diet and exercise encouraged. Discussed the impact being overweight would have on cardiovascular risk. Weight down 7 pounds since last clinic visit.   HTN- BP well controlled. Continue current antihypertensive regimen.    Aortic stenosis-mild by echo 11/2022.  We  plan to repeat echo in 1 year.  Continue optimal blood pressure control.       Disposition: Follow up in 3 month(s) with Loel Dubonnet, NP and in 6-9 months with Skeet Latch, MD or APP.  Signed, Loel Dubonnet, NP 02/10/2023, 2:19 PM [facility]

## 2023-02-10 NOTE — Patient Instructions (Signed)
Medication Instructions:  Your physician recommends that you continue on your current medications as directed. Please refer to the Current Medication list given to you today.  *If you need a refill on your cardiac medications before your next appointment, please call your pharmacy*  Testing/Procedures: Your physician has recommended that you wear a Zio monitor.   This monitor is a medical device that records the heart's electrical activity. Doctors most often use these monitors to diagnose arrhythmias. Arrhythmias are problems with the speed or rhythm of the heartbeat. The monitor is a small device applied to your chest. You can wear one while you do your normal daily activities. While wearing this monitor if you have any symptoms to push the button and record what you felt. Once you have worn this monitor for the period of time provider prescribed (Usually 14 days), you will return the monitor device in the postage paid box. Once it is returned they will download the data collected and provide [place] with a report which the provider will then review and we will call you with those results. Important tips:  Avoid showering during the first 24 hours of wearing the monitor. Avoid excessive sweating to help maximize wear time. Do not submerge the device, no hot tubs, and no swimming pools. Keep any lotions or oils away from the patch. After 24 hours you may shower with the patch on. Take brief showers with your back facing the shower head.  Do not remove patch once it has been placed because that will interrupt data and decrease adhesive wear time. Push the button when you have any symptoms and write down what you were feeling. Once you have completed wearing your monitor, remove and place into box which has postage paid and place in your outgoing mailbox.  If for some reason you have misplaced your box then call our office and we can provide another box and/or mail it off for you.   Follow-Up: At  [facility], you and your health needs are our priority.  As part of our continuing mission to provide you with exceptional heart care, we have created designated Provider Care Teams.  These Care Teams include your primary Cardiologist (physician) and Advanced Practice Providers (APPs -  Physician Assistants and Nurse Practitioners) who all work together to provide you with the care you need, when you need it.  We recommend signing up for the patient portal called "MyChart".  Sign up information is provided on this After Visit Summary.  MyChart is used to connect with patients for Virtual Visits (Telemedicine).  Patients are able to view lab/test results, encounter notes, upcoming appointments, etc.  Non-urgent messages can be sent to your provider as well.   To learn more about what you can do with MyChart, go to [email or web address].    Your next appointment:   3 months with Laurann Montana, NP   &   6-9 months with Dr. Oval Linsey

## 2023-02-27 ENCOUNTER — Encounter (HOSPITAL_BASED_OUTPATIENT_CLINIC_OR_DEPARTMENT_OTHER): Payer: Self-pay

## 2023-02-27 NOTE — Telephone Encounter (Signed)
Please review

## 2023-03-09 ENCOUNTER — Other Ambulatory Visit: Payer: Self-pay | Admitting: Cardiovascular Disease

## 2023-03-15 ENCOUNTER — Ambulatory Visit (HOSPITAL_BASED_OUTPATIENT_CLINIC_OR_DEPARTMENT_OTHER): Payer: Federal, State, Local not specified - PPO | Attending: Cardiology | Admitting: Cardiology

## 2023-03-15 VITALS — Ht 69.0 in | Wt >= 6400 oz

## 2023-03-15 DIAGNOSIS — I4891 Unspecified atrial fibrillation: Secondary | ICD-10-CM | POA: Insufficient documentation

## 2023-03-15 DIAGNOSIS — G4733 Obstructive sleep apnea (adult) (pediatric): Secondary | ICD-10-CM | POA: Insufficient documentation

## 2023-03-15 DIAGNOSIS — R0683 Snoring: Secondary | ICD-10-CM

## 2023-03-19 NOTE — Procedures (Signed)
   Patient Name: [name], [name] Date: 03/15/2023 Gender: Female D.O.B: 1966-10-09 Age (years): 52 Referring Provider: Armanda Magic MD, ABSM Height (inches): 69 Interpreting Physician: Armanda Magic MD, ABSM Weight (lbs): 400 RPSGT: Lowry Ram BMI: 59 MRN: [id number] Neck Size: 18.00  CLINICAL INFORMATION The patient is referred for a CPAP titration to treat sleep apnea.  SLEEP STUDY TECHNIQUE As per the AASM Manual for the Scoring of Sleep and Associated Events v2.3 (April 2016) with a hypopnea requiring 4% desaturations.  The channels recorded and monitored were frontal, central and occipital EEG, electrooculogram (EOG), submentalis EMG (chin), nasal and oral airflow, thoracic and abdominal wall motion, anterior tibialis EMG, snore microphone, electrocardiogram, and pulse oximetry. Continuous positive airway pressure (CPAP) was initiated at the beginning of the study and titrated to treat sleep-disordered breathing.  MEDICATIONS Medications self-administered by patient taken the night of the study : TYLENOL PM  TECHNICIAN COMMENTS Comments added by technician: Patient had difficulty initiating sleep. Patient was restless all through the night. Comments added by scorer: N/A  RESPIRATORY PARAMETERS Optimal PAP Pressure (cm): 13  AHI at Optimal Pressure (/hr):0 Overall Minimal O2 (%):87.0  Supine % at Optimal Pressure (%):N/A Minimal O2 at Optimal Pressure (%): 91.0   SLEEP ARCHITECTURE The study was initiated at 10:28:14 PM and ended at 4:57:10 AM.  Sleep onset time was 49.9 minutes and the sleep efficiency was 52.2%. The total sleep time was 203 minutes.  The patient spent 14.0% of the night in stage N1 sleep, 73.2% in stage N2 sleep, 0.0% in stage N3 and 12.8% in REM.Stage REM latency was 276.5 minutes  Wake after sleep onset was 136.0. Alpha intrusion was absent. Supine sleep was 0.00%.  CARDIAC DATA The 2 lead EKG demonstrated Atrial Fibrillation. The mean  heart rate was 74.6 beats per minute. Other EKG findings include: PVCs.  LEG MOVEMENT DATA The total Periodic Limb Movements of Sleep (PLMS) were 0. The PLMS index was 0.0. A PLMS index of <15 is considered normal in adults.  IMPRESSIONS - An optimal PAP pressure of 13cm H2O was obtained based on the available study data. - Central sleep apnea was not noted during this titration (CAI = 1.2/h). - Mild oxygen desaturations were observed during this titration (min O2 = 87.0%). - The patient snored with soft snoring volume during this titration study. - 2-lead EKG demonstrated: PVCs, atrial fibrillation - Clinically significant periodic limb movements were not noted during this study. Arousals associated with PLMs were significant.  DIAGNOSIS - Obstructive Sleep Apnea (G47.33) - Atrial Fibrillation  RECOMMENDATIONS - Recommend a trial of ResMed CPAP at 13cm H2O with heated humidty and mask of choice. - Avoid alcohol, sedatives and other CNS depressants that may worsen sleep apnea and disrupt normal sleep architecture. - Sleep hygiene should be reviewed to assess factors that may improve sleep quality. - Weight management and regular exercise should be initiated or continued. - Return to Sleep Center for re-evaluation after 4 weeks of therapy  [Electronically signed] 03/19/2023 07:37 PM  Armanda Magic MD, ABSM Diplomate, American Board of Sleep Medicine

## 2023-03-24 DIAGNOSIS — N200 Calculus of kidney: Secondary | ICD-10-CM | POA: Diagnosis not present

## 2023-03-24 DIAGNOSIS — R310 Gross hematuria: Secondary | ICD-10-CM | POA: Diagnosis not present

## 2023-03-27 ENCOUNTER — Telehealth (HOSPITAL_BASED_OUTPATIENT_CLINIC_OR_DEPARTMENT_OTHER): Payer: Self-pay

## 2023-03-27 ENCOUNTER — Telehealth: Payer: Self-pay | Admitting: *Deleted

## 2023-03-27 NOTE — Telephone Encounter (Signed)
-----   Message from Gaynelle Cage, [place] sent at 03/20/2023  8:05 AM EDT -----  ----- Message ----- From: Quintella Reichert, MD Sent: 03/19/2023   7:40 PM EDT To: Cv Div Sleep Studies  Please let patient know that they had a successful PAP titration and let DME know that orders are in EPIC.  Please set up 6 week OV with me.

## 2023-03-27 NOTE — Telephone Encounter (Signed)
Able to complete >4 METS. Per AHA/ACC guidelines, she is deemed acceptable risk for the planned procedure without additional cardiovascular testing.   Alver Sorrow, NP

## 2023-03-27 NOTE — Telephone Encounter (Signed)
Hi Caitlain,  You recently saw Ms. Monrroy in the office on 02/10/2023.  Do you feel she would be at acceptable risk to complete upcoming cystoscopy with stent placement for kidney stones.  Please forward your comments to P CV DIV PREOP.  Thank you, Robin Searing, NP

## 2023-03-27 NOTE — Telephone Encounter (Signed)
Pharmacy please advise on holding Eliquis prior to  2 stage Cysto, Bilateral URS, LL, Stent B/L Kidney Stones  scheduled for TBD. Thank you.

## 2023-03-27 NOTE — Telephone Encounter (Signed)
The patient has been notified of the result. Left detailed message on voicemail and informed patient to call back..Nasif Bos Green, CMA   

## 2023-03-27 NOTE — Telephone Encounter (Signed)
   Patient Name: [name]  DOB: [birth year] MRN: [id number]  Primary Cardiologist: Chilton Si, MD  Chart reviewed as part of pre-operative protocol coverage. Given past medical history and time since last visit, based on ACC/AHA guidelines, [name] is at acceptable risk for the planned procedure without further cardiovascular testing.  She was last seen in office on 02/10/2023 and is able to complete greater than 4 METS of activity per Gillian Shields, NP.  The patient was advised that if she develops new symptoms prior to surgery to contact our office to arrange for a follow-up visit, and she verbalized understanding.  Per protocol, pt can hold Eliquis for 2 days prior to procedure.   I will route this recommendation to the requesting party via Epic fax function and remove from pre-op pool.  Please call with questions.  Napoleon Form, Leodis Rains, NP 03/27/2023, 4:19 PM

## 2023-03-27 NOTE — Telephone Encounter (Signed)
Patient with diagnosis of afib on Eliquis for anticoagulation.    Procedure: 2 stage Cysto, Bilateral URS, LL, Stent B/L Kidney Stones  Date of procedure: TBD  CHA2DS2-VASc Score = 2  This indicates a 2.2% annual risk of stroke. The patient's score is based upon: CHF History: 0 HTN History: 1 Diabetes History: 0 Stroke History: 0 Vascular Disease History: 0 Age Score: 0 Gender Score: 1  Underwent DCCV 01/25/23.  CrCl >116mL/min Platelet count 341K  No procedure requires 5 day Eliquis hold, may have been thinking of warfarin. Per protocol, pt can hold Eliquis for 2 days prior to procedure.  **This guidance is not considered finalized until pre-operative APP has relayed final recommendations.**

## 2023-03-27 NOTE — Telephone Encounter (Signed)
   Pre-operative Risk Assessment    Patient Name: [name]  DOB: 1965/12/22 MRN: [id number]      Request for Surgical Clearance    Procedure:   2 stage Cysto, Bilateral URS, LL, Stent B/L Kidney Stones  Date of Surgery:  Clearance TBD                                 Surgeon:  Dr. Estrellita Ludwig Surgeon's Group or Practice Name:  [facility] Phone number:  [phone] Fax number:  [phone]   Type of Clearance Requested:   - Medical  - Pharmacy:  Hold Apixaban (Eliquis) 5 days   Type of Anesthesia:   Not specified   Additional requests/questions:   None  Lorella Nimrod   03/27/2023, 1:27 PM

## 2023-03-28 NOTE — Telephone Encounter (Signed)
Return Call: The patient has been notified of the result and verbalized understanding.  All questions (if any) were answered. [name], CMA 03/28/2023 10:33 AM    Upon patient request DME selection is [facility]. Patient understands he will be contacted by [facility] to set up his cpap. Patient understands to call if [facility] does not contact him with new setup in a timely manner. Patient understands they will be called once confirmation has been received from [facility]/ that they have received their new machine to schedule 10 week follow up appointment.   [facility] notified of new cpap order  Please add to airview Patient was grateful for the call and thanked me.

## 2023-03-31 ENCOUNTER — Other Ambulatory Visit: Payer: Self-pay | Admitting: Urology

## 2023-04-04 NOTE — Progress Notes (Addendum)
Anesthesia Review:  PCP: Jarrett Soho  Cardiologist : Chilton Si  Clearance 02/2923 in telephone encounter Alden Server DickNP  LOV Lily Kocher 02/10/23  Chest x-ray : EKG : 02/10/23  Echo : 12/21/22  Stress test: Cardiac Cath :  Activity level: cannot do a flgiht of stairs without difficutly  Sleep Study/ CPAP : has cpap - to bring mask and tubing day of surgery per MD  Fasting Blood Sugar :      / Checks Blood Sugar -- times a day:   Blood Thinner/ Instructions /Last Dose: ASA / Instructions/ Last Dose :    Eliquis - last dose on 04/08/2023  Cardioversion- 01/25/23    Hx of pneumonia after Gastric Bypass per pt    Take blood pressure lower arm  Weight approx 400 lbs

## 2023-04-05 NOTE — Patient Instructions (Addendum)
SURGICAL WAITING ROOM VISITATION  Patients having surgery or a procedure may have no more than 2 support people in the waiting area - these visitors may rotate.    Children under the age of 33 must have an adult with them who is not the patient.  Due to an increase in RSV and influenza rates and associated hospitalizations, children ages 70 and under may not visit patients in [facility] hospitals.  If the patient needs to stay at the hospital during part of their recovery, the visitor guidelines for inpatient rooms apply. Pre-op nurse will coordinate an appropriate time for 1 support person to accompany patient in pre-op.  This support person may not rotate.    Please refer to the [facility] website for the visitor guidelines for Inpatients (after your surgery is over and you are in a regular room).       Your procedure is scheduled on: 04/11/2023    Report to [facility] Main Entrance    Report to admitting at  0515 AM   Call this number if you have problems the morning of surgery [phone]   Do not eat food or dirnk liquids  :After Midnight.                         Oral Hygiene is also important to reduce your risk of infection.                                    Remember - BRUSH YOUR TEETH THE MORNING OF SURGERY WITH YOUR REGULAR TOOTHPASTE  DENTURES WILL BE REMOVED PRIOR TO SURGERY PLEASE DO NOT APPLY "Poly grip" OR ADHESIVES!!!   Do NOT smoke after Midnight   Take these medicines the morning of surgery with A SIP OF WATER:  prozac,, metoprolol   DO NOT TAKE ANY ORAL DIABETIC MEDICATIONS DAY OF YOUR SURGERY  Bring CPAP mask and tubing day of surgery.                              You may not have any metal on your body including hair pins, jewelry, and body piercing             Do not wear make-up, lotions, powders, perfumes/cologne, or deodorant  Do not wear nail polish including gel and S&S, artificial/acrylic nails, or any other type of covering on  natural nails including finger and toenails. If you have artificial nails, gel coating, etc. that needs to be removed by a nail salon please have this removed prior to surgery or surgery may need to be canceled/ delayed if the surgeon/ anesthesia feels like they are unable to be safely monitored.   Do not shave  48 hours prior to surgery.               Men may shave face and neck.   Do not bring valuables to the hospital. [facility] IS NOT             RESPONSIBLE   FOR VALUABLES.   Contacts, glasses, dentures or bridgework may not be worn into surgery.   Bring small overnight bag day of surgery.   DO NOT BRING YOUR HOME MEDICATIONS TO THE HOSPITAL. PHARMACY WILL DISPENSE MEDICATIONS LISTED ON YOUR MEDICATION LIST TO YOU DURING YOUR ADMISSION IN THE HOSPITAL!  Patients discharged on the day of surgery will not be allowed to drive home.  Someone NEEDS to stay with you for the first 24 hours after anesthesia.   Special Instructions: Bring a copy of your healthcare power of attorney and living will documents the day of surgery if you haven't scanned them before.              Please read over the following fact sheets you were given: IF YOU HAVE QUESTIONS ABOUT YOUR PRE-OP INSTRUCTIONS PLEASE CALL [phone]   If you received a COVID test during your pre-op visit  it is requested that you wear a mask when out in public, stay away from anyone that may not be feeling well and notify your surgeon if you develop symptoms. If you test positive for Covid or have been in contact with anyone that has tested positive in the last 10 days please notify you surgeon.    [facility] - Preparing for Surgery Before surgery, you can play an important role.  Because skin is not sterile, your skin needs to be as free of germs as possible.  You can reduce the number of germs on your skin by washing with CHG (chlorahexidine gluconate) soap before surgery.  CHG is an antiseptic cleaner which kills germs and  bonds with the skin to continue killing germs even after washing. Please DO NOT use if you have an allergy to CHG or antibacterial soaps.  If your skin becomes reddened/irritated stop using the CHG and inform your nurse when you arrive at Short Stay. Do not shave (including legs and underarms) for at least 48 hours prior to the first CHG shower.  You may shave your face/neck. Please follow these instructions carefully:  1.  Shower with CHG Soap the night before surgery and the  morning of Surgery.  2.  If you choose to wash your hair, wash your hair first as usual with your  normal  shampoo.  3.  After you shampoo, rinse your hair and body thoroughly to remove the  shampoo.                           4.  Use CHG as you would any other liquid soap.  You can apply chg directly  to the skin and wash                       Gently with a scrungie or clean washcloth.  5.  Apply the CHG Soap to your body ONLY FROM THE NECK DOWN.   Do not use on face/ open                           Wound or open sores. Avoid contact with eyes, ears mouth and genitals (private parts).                       Wash face,  Genitals (private parts) with your normal soap.             6.  Wash thoroughly, paying special attention to the area where your surgery  will be performed.  7.  Thoroughly rinse your body with warm water from the neck down.  8.  DO NOT shower/wash with your normal soap after using and rinsing off  the CHG Soap.  9.  Pat yourself dry with a clean towel.            10.  Wear clean pajamas.            11.  Place clean sheets on your bed the night of your first shower and do not  sleep with pets. Day of Surgery : Do not apply any lotions/deodorants the morning of surgery.  Please wear clean clothes to the hospital/surgery center.  FAILURE TO FOLLOW THESE INSTRUCTIONS MAY RESULT IN THE CANCELLATION OF YOUR SURGERY PATIENT SIGNATURE_________________________________  NURSE  SIGNATURE__________________________________  ________________________________________________________________________

## 2023-04-05 NOTE — H&P (Signed)
CC/HPI: cc: urolithiasis   03/24/23: 37 woman who was recently diagnosed with bilateral urolithiasis on imaging. She developed gross hematuria after starting Eliquis and subsequent CT showed bilateral nonobstructing renal calculi. Patient is never passed stone before. She is not experiencing any flank pain but continues to experience intermittent gross hematuria. She has a history of morbid obesity had a gastric bypass in 2015.     ALLERGIES:  Penicillin    MEDICATIONS: Metoprolol Succinate 50 mg tablet, extended release 24 hr  Tamsulosin Hcl 0.4 mg capsule  Eliquis 5 mg tablet  Fluoxetine Hcl 20 mg tablet  Lorazepam 0.5 mg tablet  Norethindrone 0.35 mg tablet  Triamterene-Hydrochlorothiazid 37.5 mg-25 mg tablet     GU PSH: None   NON-GU PSH: Appendectomy C-Section Gastric bypass Remove Gallbladder - 2003     GU PMH: No GU PMH    NON-GU PMH: No Non-GU PMH    FAMILY HISTORY: No Family History    SOCIAL HISTORY: Marital Status: Married Preferred Language: English; Race: White Has never drank.  Drinks 2 caffeinated drinks per day.    REVIEW OF SYSTEMS:    GU Review Female:   Patient reports hard to postpone urination, get up at night to urinate, and leakage of urine. Patient denies frequent urination, burning /pain with urination, stream starts and stops, trouble starting your stream, have to strain to urinate, and being pregnant.  Gastrointestinal (Upper):   Patient denies nausea, vomiting, and indigestion/ heartburn.  Gastrointestinal (Lower):   Patient denies diarrhea and constipation.  Constitutional:   Patient denies fever, night sweats, weight loss, and fatigue.  Skin:   Patient denies skin rash/ lesion and itching.  Eyes:   Patient denies blurred vision and double vision.  Ears/ Nose/ Throat:   Patient denies sore throat and sinus problems.  Hematologic/Lymphatic:   Patient denies swollen glands and easy bruising.  Cardiovascular:   Patient denies leg swelling and  chest pains.  Respiratory:   Patient denies cough and shortness of breath.  Endocrine:   Patient denies excessive thirst.  Musculoskeletal:   Patient reports joint pain. Patient denies back pain.  Neurological:   Patient denies headaches and dizziness.  Psychologic:   Patient reports anxiety. Patient denies depression.   Notes: hematuria    VITAL SIGNS: None   MULTI-SYSTEM PHYSICAL EXAMINATION:    Constitutional: Well-nourished. No physical deformities. Normally developed. Good grooming.  Neck: Neck symmetrical, not swollen. Normal tracheal position.  Respiratory: No labored breathing, no use of accessory muscles.   Skin: No paleness, no jaundice, no cyanosis. No lesion, no ulcer, no rash.  Neurologic / Psychiatric: Oriented to time, oriented to place, oriented to person. No depression, no anxiety, no agitation.  Eyes: Normal conjunctivae. Normal eyelids.  Ears, Nose, Mouth, and Throat: Left ear no scars, no lesions, no masses. Right ear no scars, no lesions, no masses. Nose no scars, no lesions, no masses. Normal hearing. Normal lips.  Musculoskeletal: Normal gait and station of head and neck.     Complexity of Data:  Urine Test Review:   Urinalysis  X-Ray Review: C.T. Abdomen/Pelvis: Reviewed Films. Reviewed Report. Discussed With Patient.  IMPRESSION:  Bilateral nephrolithiasis. No evidence of ureteral calculi,  hydronephrosis, or other acute findings.   No radiographic evidence of urinary tract neoplasm.   Moderate epigastric ventral hernia containing a loop of transverse  colon. No evidence of obstruction strangulation.   Previous gastric bypass surgery, with small hiatal hernia.   Aortic Atherosclerosis (ICD10-I70.0).    Electronically Signed  By: Danae Orleans M.D.  On: 01/22/2023 11:25    PROCEDURES:          Urinalysis w/Scope Dipstick Dipstick Cont'd Micro  Color: Yellow Bilirubin: Neg mg/dL WBC/hpf: >16/XWR  Appearance: Slightly Cloudy Ketones: Neg mg/dL  RBC/hpf: 20 - 60/AVW  Specific Gravity: 1.020 Blood: 3+ ery/uL Bacteria: Many (>50/hpf)  pH: 7.0 Protein: 1+ mg/dL Cystals: NS (Not Seen)  Glucose: Neg mg/dL Urobilinogen: 0.2 mg/dL Casts: NS (Not Seen)    Nitrites: Positive Trichomonas: Not Present    Leukocyte Esterase: 3+ leu/uL Mucous: Not Present      Epithelial Cells: NS (Not Seen)      Yeast: NS (Not Seen)      Sperm: Not Present    Notes: Micro performed on unspun urine    ASSESSMENT:      ICD-10 Details  1 GU:   Renal calculus - N20.0 Chronic, Stable  2   Gross hematuria - R31.0 Undiagnosed New Problem   PLAN:           Document Letter(s):  Created for Patient: Clinical Summary         Notes:   Gross hematuria/urolithiasis:  -Patient with intermittent gross hematuria that needs a diagnostic cystoscopy  -She also has a large stone burden greater than 1 cm bilaterally  -I would like to obtain cardiology risk assessment prior to general anesthesia given patient's comorbidities including morbid obesity and A-fib. If okay to schedule surgery would recommend diagnostic cystoscopy with possible staged bilateral ureteroscopy.  -Risks and benefits of the procedure discussed with the patient in detail including but Alem to pain, bleeding, infection, stent discomfort, need for staged procedure, demonstrating structures, ureteral stricture, inability to clear stone, need for additional treatment   Tentatively schedule diagnostic cystoscopy and possible staged ureteroscopy pending risk assessment. Overall I am concerned about general anesthesia with her BMI.

## 2023-04-05 NOTE — H&P (View-Only) (Signed)
CC/HPI: cc: urolithiasis   03/24/23: 56 woman who was recently diagnosed with bilateral urolithiasis on imaging. She developed gross hematuria after starting Eliquis and subsequent CT showed bilateral nonobstructing renal calculi. Patient is never passed stone before. She is not experiencing any flank pain but continues to experience intermittent gross hematuria. She has a history of morbid obesity had a gastric bypass in 2015.     ALLERGIES:  Penicillin    MEDICATIONS: Metoprolol Succinate 50 mg tablet, extended release 24 hr  Tamsulosin Hcl 0.4 mg capsule  Eliquis 5 mg tablet  Fluoxetine Hcl 20 mg tablet  Lorazepam 0.5 mg tablet  Norethindrone 0.35 mg tablet  Triamterene-Hydrochlorothiazid 37.5 mg-25 mg tablet     GU PSH: None   NON-GU PSH: Appendectomy C-Section Gastric bypass Remove Gallbladder - 2003     GU PMH: No GU PMH    NON-GU PMH: No Non-GU PMH    FAMILY HISTORY: No Family History    SOCIAL HISTORY: Marital Status: Married Preferred Language: English; Race: White Has never drank.  Drinks 2 caffeinated drinks per day.    REVIEW OF SYSTEMS:    GU Review Female:   Patient reports hard to postpone urination, get up at night to urinate, and leakage of urine. Patient denies frequent urination, burning /pain with urination, stream starts and stops, trouble starting your stream, have to strain to urinate, and being pregnant.  Gastrointestinal (Upper):   Patient denies nausea, vomiting, and indigestion/ heartburn.  Gastrointestinal (Lower):   Patient denies diarrhea and constipation.  Constitutional:   Patient denies fever, night sweats, weight loss, and fatigue.  Skin:   Patient denies skin rash/ lesion and itching.  Eyes:   Patient denies blurred vision and double vision.  Ears/ Nose/ Throat:   Patient denies sore throat and sinus problems.  Hematologic/Lymphatic:   Patient denies swollen glands and easy bruising.  Cardiovascular:   Patient denies leg swelling and  chest pains.  Respiratory:   Patient denies cough and shortness of breath.  Endocrine:   Patient denies excessive thirst.  Musculoskeletal:   Patient reports joint pain. Patient denies back pain.  Neurological:   Patient denies headaches and dizziness.  Psychologic:   Patient reports anxiety. Patient denies depression.   Notes: hematuria    VITAL SIGNS: None   MULTI-SYSTEM PHYSICAL EXAMINATION:    Constitutional: Well-nourished. No physical deformities. Normally developed. Good grooming.  Neck: Neck symmetrical, not swollen. Normal tracheal position.  Respiratory: No labored breathing, no use of accessory muscles.   Skin: No paleness, no jaundice, no cyanosis. No lesion, no ulcer, no rash.  Neurologic / Psychiatric: Oriented to time, oriented to place, oriented to person. No depression, no anxiety, no agitation.  Eyes: Normal conjunctivae. Normal eyelids.  Ears, Nose, Mouth, and Throat: Left ear no scars, no lesions, no masses. Right ear no scars, no lesions, no masses. Nose no scars, no lesions, no masses. Normal hearing. Normal lips.  Musculoskeletal: Normal gait and station of head and neck.     Complexity of Data:  Urine Test Review:   Urinalysis  X-Ray Review: C.T. Abdomen/Pelvis: Reviewed Films. Reviewed Report. Discussed With Patient.  IMPRESSION:  Bilateral nephrolithiasis. No evidence of ureteral calculi,  hydronephrosis, or other acute findings.   No radiographic evidence of urinary tract neoplasm.   Moderate epigastric ventral hernia containing a loop of transverse  colon. No evidence of obstruction strangulation.   Previous gastric bypass surgery, with small hiatal hernia.   Aortic Atherosclerosis (ICD10-I70.0).    Electronically Signed    By: John A Stahl M.D.  On: 01/22/2023 11:25    PROCEDURES:          Urinalysis w/Scope Dipstick Dipstick Cont'd Micro  Color: Yellow Bilirubin: Neg mg/dL WBC/hpf: >60/hpf  Appearance: Slightly Cloudy Ketones: Neg mg/dL  RBC/hpf: 20 - 40/hpf  Specific Gravity: 1.020 Blood: 3+ ery/uL Bacteria: Many (>50/hpf)  pH: 7.0 Protein: 1+ mg/dL Cystals: NS (Not Seen)  Glucose: Neg mg/dL Urobilinogen: 0.2 mg/dL Casts: NS (Not Seen)    Nitrites: Positive Trichomonas: Not Present    Leukocyte Esterase: 3+ leu/uL Mucous: Not Present      Epithelial Cells: NS (Not Seen)      Yeast: NS (Not Seen)      Sperm: Not Present    Notes: Micro performed on unspun urine    ASSESSMENT:      ICD-10 Details  1 GU:   Renal calculus - N20.0 Chronic, Stable  2   Gross hematuria - R31.0 Undiagnosed New Problem   PLAN:           Document Letter(s):  Created for Patient: Clinical Summary         Notes:   Gross hematuria/urolithiasis:  -Patient with intermittent gross hematuria that needs a diagnostic cystoscopy  -She also has a large stone burden greater than 1 cm bilaterally  -I would like to obtain cardiology risk assessment prior to general anesthesia given patient's comorbidities including morbid obesity and A-fib. If okay to schedule surgery would recommend diagnostic cystoscopy with possible staged bilateral ureteroscopy.  -Risks and benefits of the procedure discussed with the patient in detail including but A[place] to pain, bleeding, infection, stent discomfort, need for staged procedure, demonstrating structures, ureteral stricture, inability to clear stone, need for additional treatment   Tentatively schedule diagnostic cystoscopy and possible staged ureteroscopy pending risk assessment. Overall I am concerned about general anesthesia with her BMI.      

## 2023-04-07 ENCOUNTER — Other Ambulatory Visit: Payer: Self-pay | Admitting: Obstetrics and Gynecology

## 2023-04-07 ENCOUNTER — Other Ambulatory Visit: Payer: Self-pay

## 2023-04-07 ENCOUNTER — Ambulatory Visit
Admission: RE | Admit: 2023-04-07 | Discharge: 2023-04-07 | Disposition: A | Discharge status: Home / Self Care | Payer: Federal, State, Local not specified - PPO | Source: Ambulatory Visit | Attending: Obstetrics and Gynecology | Admitting: Obstetrics and Gynecology

## 2023-04-07 ENCOUNTER — Encounter (HOSPITAL_COMMUNITY): Payer: Self-pay

## 2023-04-07 ENCOUNTER — Encounter (HOSPITAL_COMMUNITY)
Admission: RE | Admit: 2023-04-07 | Discharge: 2023-04-07 | Disposition: A | Discharge status: Home / Self Care | Payer: Federal, State, Local not specified - PPO | Source: Ambulatory Visit | Attending: Urology | Admitting: Urology

## 2023-04-07 VITALS — BP 118/80 | HR 72 | Temp 98.2°F | Resp 16 | Ht 69.0 in

## 2023-04-07 DIAGNOSIS — R310 Gross hematuria: Secondary | ICD-10-CM | POA: Insufficient documentation

## 2023-04-07 DIAGNOSIS — G4733 Obstructive sleep apnea (adult) (pediatric): Secondary | ICD-10-CM | POA: Diagnosis not present

## 2023-04-07 DIAGNOSIS — G4730 Sleep apnea, unspecified: Secondary | ICD-10-CM | POA: Diagnosis not present

## 2023-04-07 DIAGNOSIS — I10 Essential (primary) hypertension: Secondary | ICD-10-CM | POA: Insufficient documentation

## 2023-04-07 DIAGNOSIS — N200 Calculus of kidney: Secondary | ICD-10-CM | POA: Diagnosis not present

## 2023-04-07 DIAGNOSIS — I4891 Unspecified atrial fibrillation: Secondary | ICD-10-CM | POA: Insufficient documentation

## 2023-04-07 DIAGNOSIS — N6489 Other specified disorders of breast: Secondary | ICD-10-CM

## 2023-04-07 DIAGNOSIS — N6320 Unspecified lump in the left breast, unspecified quadrant: Secondary | ICD-10-CM

## 2023-04-07 DIAGNOSIS — N63 Unspecified lump in unspecified breast: Secondary | ICD-10-CM | POA: Diagnosis not present

## 2023-04-07 DIAGNOSIS — Z01818 Encounter for other preprocedural examination: Secondary | ICD-10-CM

## 2023-04-07 HISTORY — DX: Personal history of urinary calculi: Z87.442

## 2023-04-07 HISTORY — DX: Sleep apnea, unspecified: G47.30

## 2023-04-07 LAB — BASIC METABOLIC PANEL
Anion gap: 11 (ref 5–15)
BUN: 23 mg/dL — ABNORMAL HIGH (ref 6–20)
CO2: 25 mmol/L (ref 22–32)
Calcium: 9 mg/dL (ref 8.9–10.3)
Chloride: 106 mmol/L (ref 98–111)
Creatinine, Ser: 0.84 mg/dL (ref 0.44–1.00)
GFR, Estimated: >60 mL/min (ref >60)
Glucose, Bld: 85 mg/dL (ref 70–99)
Potassium: 3.4 mmol/L — ABNORMAL LOW (ref 3.5–5.1)
Sodium: 142 mmol/L (ref 135–145)

## 2023-04-07 LAB — CBC
HCT: 48.8 % — ABNORMAL HIGH (ref 36.0–46.0)
Hemoglobin: 15.1 g/dL — ABNORMAL HIGH (ref 12.0–15.0)
MCH: 29 pg (ref 26.0–34.0)
MCHC: 30.9 g/dL (ref 30.0–36.0)
MCV: 93.8 fL (ref 80.0–100.0)
Platelets: 296 10*3/uL (ref 150–400)
RBC: 5.2 MIL/uL — ABNORMAL HIGH (ref 3.87–5.11)
RDW: 14.7 % (ref 11.5–15.5)
WBC: 11.1 10*3/uL — ABNORMAL HIGH (ref 4.0–10.5)
nRBC: 0 % (ref 0.0–0.2)

## 2023-04-07 NOTE — Progress Notes (Signed)
Anesthesia Chart Review   Case: [id number] Date/Time: 04/11/23 0715   Procedure: DIAGNOSTIC CYSTOSCOPY, BILATERAL URETEROSCOPY, HOLMIUM LASER LITHOTRIPSY, AND BILATERAL URETERAL STENT PLACEMENT (Bilateral) - 90 MINUTES   Anesthesia type: General   Pre-op diagnosis: GROSS HEMATURIA, BILATERAL RENAL CALCULUS   Location: [facility] / [facility]   Surgeons: Noel Christmas, MD       DISCUSSION: 57 year old never smoker with history of HTN, sleep apnea with CPAP, atrial fibrillation, mild AS echo 12/17/2022, aggressive nocturia, bilateral renal calculi scheduled for above procedure 04/11/2023 with Dr. Roselee Nova pace.  Per cardiology preoperative evaluation 03/27/2023, "Chart reviewed as part of pre-operative protocol coverage. Given past medical history and time since last visit, based on ACC/AHA guidelines, [name] is at acceptable risk for the planned procedure without further cardiovascular testing.  She was last seen in office on 02/10/2023 and is able to complete greater than 4 METS of activity per Gillian Shields, NP.   The patient was advised that if she develops new symptoms prior to surgery to contact our office to arrange for a follow-up visit, and she verbalized understanding.   Per protocol, pt can hold Eliquis for 2 days prior to procedure."  Anticipate pt can proceed with planned procedure barring acute status change.   VS: BP 118/80   Pulse 72   Temp 36.8 C (Oral)   Resp 16   Ht 5\' 9"  (1.753 m)   SpO2 97%   BMI 59.07 kg/m   PROVIDERS: Jarrett Soho, PA-C is PCP  Primary Cardiologist: Chilton Si, MD  LABS: Labs reviewed: Acceptable for surgery. (all labs ordered are listed, but only abnormal results are displayed)  Labs Reviewed  CBC - Abnormal; Notable for the following components:      Result Value   WBC 11.1 (*)    RBC 5.20 (*)    Hemoglobin 15.1 (*)    HCT 48.8 (*)    All other components within normal limits  BASIC METABOLIC PANEL -  Abnormal; Notable for the following components:   Potassium 3.4 (*)    BUN 23 (*)    All other components within normal limits     IMAGES:   EKG:   CV: Echo 12/21/2022 1. Appears to be in Afib during study. Left ventricular ejection  fraction, by estimation, is 60 to 65%. The left ventricle has normal  function. The left ventricle has no regional wall motion abnormalities.  There is moderate left ventricular hypertrophy.   Left ventricular diastolic parameters are indeterminate.   2. Right ventricular systolic function is normal. The right ventricular  size is mildly enlarged. The estimated right ventricular systolic pressure  is 29.6 mmHg.   3. The mitral valve is degenerative. Trivial mitral valve regurgitation.  Severe mitral annular calcification.   4. The aortic valve was not well visualized. Aortic valve regurgitation  is not visualized. Mild aortic valve stenosis.   5. The inferior vena cava is normal in size with greater than 50%  respiratory variability, suggesting right atrial pressure of 3 mmHg.  Past Medical History:  Diagnosis Date   Anemia    Anxiety    Aortic stenosis 11/04/2021   Arthritis    knees    Atrial fibrillation (HCC)    Headache    hx of migraines    History of hiatal hernia    History of kidney stones    Hypertension    LVH (left ventricular hypertrophy) due to hypertensive disease 11/2022   Morbid obesity with BMI  of 60.0-69.9, adult (HCC)    BMI 62   Murmur 08/15/2017   Pneumonia 07/18/2011   hx of pneumonia after gastric bypass in 2015   Preoperative evaluation of a medical condition to rule out surgical contraindications (TAR required) 11/04/2021   Sleep apnea    cpap    Past Surgical History:  Procedure Laterality Date   APPENDECTOMY     BREAST BIOPSY Left 09/14/2020   BREAST BIOPSY Right 10/14/2022   [place] RT BREAST BX W LOC DEV 1ST LESION IMG BX SPEC US GUIDE 10/14/2022 [facility]   CARDIOVERSION N/A 01/25/2023    Procedure: CARDIOVERSION;  Surgeon: Jake Bathe, MD;  Location: [facility];  Service: Cardiovascular;  Laterality: N/A;   CARPAL TUNNEL RELEASE Right 11/23/2021   Procedure: CARPAL TUNNEL RELEASE;  Surgeon: Frederico Hamman, MD;  Location: [facility];  Service: Orthopedics;  Laterality: Right;   CESAREAN SECTION     CHOLECYSTECTOMY     LAPAROSCOPIC ROUX-EN-Y GASTRIC BYPASS WITH HIATAL HERNIA REPAIR N/A 12/30/2014   Procedure: LAPAROSCOPIC ROUX-EN-Y GASTRIC BYPASS WITH HIATAL HERNIA REPAIR AND UPPER ENDOSCOPY;  Surgeon: [name], MD;  Location: [facility];  Service: General;  Laterality: N/A;   TONSILLECTOMY      MEDICATIONS:  apixaban (ELIQUIS) 5 MG TABS tablet   Cyanocobalamin (VITAMIN B-12) 5000 MCG TBDP   diphenhydramine-acetaminophen (TYLENOL PM) 25-500 MG TABS   ferrous sulfate 325 (65 FE) MG tablet   FLUoxetine (PROZAC) 20 MG capsule   LORazepam (ATIVAN) 0.5 MG tablet   metoprolol tartrate (LOPRESSOR) 50 MG tablet   Multiple Vitamin (MULTIVITAMIN WITH MINERALS) TABS tablet   NORLYDA 0.35 MG tablet   triamterene-hydrochlorothiazide (MAXZIDE-25) 37.5-25 MG tablet   No current facility-administered medications for this encounter.   Jodell Cipro Ward, PA-C [facility] [phone]

## 2023-04-07 NOTE — Anesthesia Preprocedure Evaluation (Signed)
Anesthesia Evaluation  Patient identified by MRN, date of birth, ID band Patient awake    Reviewed: Allergy & Precautions, NPO status , Patient's Chart, lab work & pertinent test results  History of Anesthesia Complications Negative for: history of anesthetic complications  Airway Mallampati: IV  TM Distance: >3 FB Neck ROM: Full    Dental  (+) Teeth Intact, Dental Advisory Given,    Pulmonary sleep apnea and Continuous Positive Airway Pressure Ventilation , neg COPD, neg recent URI   breath sounds clear to auscultation       Cardiovascular hypertension, Pt. on medications and Pt. on home beta blockers (-) angina (-) Past MI  Rhythm:Irregular   1. Appears to be in Afib during study. Left ventricular ejection  fraction, by estimation, is 60 to 65%. The left ventricle has normal  function. The left ventricle has no regional wall motion abnormalities.  There is moderate left ventricular hypertrophy.   Left ventricular diastolic parameters are indeterminate.   2. Right ventricular systolic function is normal. The right ventricular  size is mildly enlarged. The estimated right ventricular systolic pressure  is 29.6 mmHg.   3. The mitral valve is degenerative. Trivial mitral valve regurgitation.  Severe mitral annular calcification.   4. The aortic valve was not well visualized. Aortic valve regurgitation  is not visualized. Mild aortic valve stenosis.   5. The inferior vena cava is normal in size with greater than 50%  respiratory variability, suggesting right atrial pressure of 3 mmHg.     Neuro/Psych  Headaches PSYCHIATRIC DISORDERS Anxiety        GI/Hepatic Neg liver ROS, hiatal hernia,,,  Endo/Other    Morbid obesityLab Results      Component                Value               Date                      HGBA1C                   5.8 (H)             12/23/2022             Renal/GU GROSS HEMATURIA, BILATERAL RENAL CALCULUS      Musculoskeletal  (+) Arthritis ,    Abdominal   Peds  Hematology negative hematology ROS (+) Lab Results      Component                Value               Date                      WBC                      11.1 (H)            04/07/2023                HGB                      15.1 (H)            04/07/2023                HCT  48.8 (H)            04/07/2023                MCV                      93.8                04/07/2023                PLT                      296                 04/07/2023              Anesthesia Other Findings   Reproductive/Obstetrics                             Anesthesia Physical Anesthesia Plan  ASA: 3  Anesthesia Plan: General   Post-op Pain Management: Minimal or no pain anticipated   Induction: Intravenous  PONV Risk Score and Plan: 3 and Ondansetron and Dexamethasone  Airway Management Planned: Oral ETT  Additional Equipment: None  Intra-op Plan:   Post-operative Plan: Extubation in OR  Informed Consent: I have reviewed the patients History and Physical, chart, labs and discussed the procedure including the risks, benefits and alternatives for the proposed anesthesia with the patient or authorized representative who has indicated his/her understanding and acceptance.     Dental advisory given  Plan Discussed with: CRNA  Anesthesia Plan Comments: (See PAT note 04/07/2023)       Anesthesia Quick Evaluation

## 2023-04-11 ENCOUNTER — Ambulatory Visit (HOSPITAL_COMMUNITY): Payer: Federal, State, Local not specified - PPO

## 2023-04-11 ENCOUNTER — Encounter (HOSPITAL_COMMUNITY)
Admission: RE | Disposition: A | Discharge status: Home / Self Care | Payer: Self-pay | Source: Ambulatory Visit | Attending: Urology

## 2023-04-11 ENCOUNTER — Ambulatory Visit (HOSPITAL_COMMUNITY): Payer: Federal, State, Local not specified - PPO | Admitting: Anesthesiology

## 2023-04-11 ENCOUNTER — Encounter (HOSPITAL_COMMUNITY): Payer: Self-pay | Admitting: Urology

## 2023-04-11 ENCOUNTER — Ambulatory Visit (HOSPITAL_COMMUNITY): Payer: Federal, State, Local not specified - PPO | Admitting: Physician Assistant

## 2023-04-11 ENCOUNTER — Ambulatory Visit (HOSPITAL_COMMUNITY)
Admission: RE | Admit: 2023-04-11 | Discharge: 2023-04-11 | Disposition: A | Discharge status: Home / Self Care | Payer: Federal, State, Local not specified - PPO | Source: Ambulatory Visit | Attending: Urology | Admitting: Urology

## 2023-04-11 DIAGNOSIS — Z79899 Other long term (current) drug therapy: Secondary | ICD-10-CM | POA: Diagnosis not present

## 2023-04-11 DIAGNOSIS — I700 Atherosclerosis of aorta: Secondary | ICD-10-CM | POA: Insufficient documentation

## 2023-04-11 DIAGNOSIS — Z9989 Dependence on other enabling machines and devices: Secondary | ICD-10-CM | POA: Diagnosis not present

## 2023-04-11 DIAGNOSIS — I3481 Nonrheumatic mitral (valve) annulus calcification: Secondary | ICD-10-CM | POA: Diagnosis not present

## 2023-04-11 DIAGNOSIS — I10 Essential (primary) hypertension: Secondary | ICD-10-CM | POA: Diagnosis not present

## 2023-04-11 DIAGNOSIS — Z9884 Bariatric surgery status: Secondary | ICD-10-CM | POA: Insufficient documentation

## 2023-04-11 DIAGNOSIS — Z01818 Encounter for other preprocedural examination: Secondary | ICD-10-CM

## 2023-04-11 DIAGNOSIS — N200 Calculus of kidney: Secondary | ICD-10-CM | POA: Diagnosis not present

## 2023-04-11 DIAGNOSIS — Z7901 Long term (current) use of anticoagulants: Secondary | ICD-10-CM | POA: Insufficient documentation

## 2023-04-11 DIAGNOSIS — G4730 Sleep apnea, unspecified: Secondary | ICD-10-CM | POA: Diagnosis not present

## 2023-04-11 DIAGNOSIS — R310 Gross hematuria: Secondary | ICD-10-CM | POA: Diagnosis not present

## 2023-04-11 DIAGNOSIS — K439 Ventral hernia without obstruction or gangrene: Secondary | ICD-10-CM | POA: Diagnosis not present

## 2023-04-11 DIAGNOSIS — M1990 Unspecified osteoarthritis, unspecified site: Secondary | ICD-10-CM | POA: Insufficient documentation

## 2023-04-11 DIAGNOSIS — I119 Hypertensive heart disease without heart failure: Secondary | ICD-10-CM | POA: Diagnosis not present

## 2023-04-11 DIAGNOSIS — Z6841 Body Mass Index (BMI) 40.0 and over, adult: Secondary | ICD-10-CM | POA: Insufficient documentation

## 2023-04-11 DIAGNOSIS — G4733 Obstructive sleep apnea (adult) (pediatric): Secondary | ICD-10-CM | POA: Diagnosis not present

## 2023-04-11 HISTORY — PX: CYSTOSCOPY/URETEROSCOPY/HOLMIUM LASER/STENT PLACEMENT: SHX6546

## 2023-04-11 SURGERY — CYSTOSCOPY/URETEROSCOPY/HOLMIUM LASER/STENT PLACEMENT
Anesthesia: General | Laterality: Bilateral

## 2023-04-11 MED ORDER — SODIUM CHLORIDE 0.9 % IR SOLN
Status: DC | PRN
  Administered 2023-04-11: 6000 mL via INTRAVESICAL

## 2023-04-11 MED ORDER — TAMSULOSIN HCL 0.4 MG PO CAPS: 0.4000 mg | ORAL_CAPSULE | Freq: Every day | ORAL | 0 refills | Status: DC

## 2023-04-11 MED ORDER — OXYCODONE HCL 5 MG PO TABS: 5.0000 mg | ORAL_TABLET | Freq: Once | ORAL | Status: DC | PRN

## 2023-04-11 MED ORDER — ACETAMINOPHEN 10 MG/ML IV SOLN: 1000.0000 mg | Freq: Once | INTRAVENOUS | Status: DC | PRN

## 2023-04-11 MED ORDER — PROPOFOL 10 MG/ML IV BOLUS
INTRAVENOUS | Status: AC
  Filled 2023-04-11: qty 20

## 2023-04-11 MED ORDER — FENTANYL CITRATE PF 50 MCG/ML IJ SOSY: 25.0000 ug | PREFILLED_SYRINGE | INTRAMUSCULAR | Status: DC | PRN

## 2023-04-11 MED ORDER — ACETAMINOPHEN 160 MG/5ML PO SOLN: 1000.0000 mg | Freq: Once | ORAL | Status: DC | PRN

## 2023-04-11 MED ORDER — ROCURONIUM BROMIDE 10 MG/ML (PF) SYRINGE
PREFILLED_SYRINGE | INTRAVENOUS | Status: AC
  Filled 2023-04-11: qty 10

## 2023-04-11 MED ORDER — OXYCODONE HCL 5 MG PO TABS: 5.0000 mg | ORAL_TABLET | Freq: Three times a day (TID) | ORAL | 0 refills | Status: DC | PRN

## 2023-04-11 MED ORDER — LACTATED RINGERS IV SOLN: INTRAVENOUS | Status: DC

## 2023-04-11 MED ORDER — CIPROFLOXACIN HCL 250 MG PO TABS: 250.0000 mg | ORAL_TABLET | Freq: Every day | ORAL | 0 refills | Status: DC

## 2023-04-11 MED ORDER — ONDANSETRON HCL 4 MG/2ML IJ SOLN
INTRAMUSCULAR | Status: AC
  Filled 2023-04-11: qty 8

## 2023-04-11 MED ORDER — SUGAMMADEX SODIUM 200 MG/2ML IV SOLN
INTRAVENOUS | Status: DC | PRN
  Administered 2023-04-11: 400 mg via INTRAVENOUS

## 2023-04-11 MED ORDER — PROPOFOL 10 MG/ML IV BOLUS
INTRAVENOUS | Status: DC | PRN
  Administered 2023-04-11: 160 mg via INTRAVENOUS

## 2023-04-11 MED ORDER — SUCCINYLCHOLINE CHLORIDE 200 MG/10ML IV SOSY
PREFILLED_SYRINGE | INTRAVENOUS | Status: AC
  Filled 2023-04-11: qty 10

## 2023-04-11 MED ORDER — LIDOCAINE 2% (20 MG/ML) 5 ML SYRINGE
INTRAMUSCULAR | Status: DC | PRN
  Administered 2023-04-11: 80 mg via INTRAVENOUS

## 2023-04-11 MED ORDER — ONDANSETRON HCL 4 MG/2ML IJ SOLN
INTRAMUSCULAR | Status: DC | PRN
  Administered 2023-04-11: 4 mg via INTRAVENOUS

## 2023-04-11 MED ORDER — IOHEXOL 300 MG/ML  SOLN
INTRAMUSCULAR | Status: DC | PRN
  Administered 2023-04-11: 20 mL via URETHRAL

## 2023-04-11 MED ORDER — PHENYLEPHRINE 80 MCG/ML (10ML) SYRINGE FOR IV PUSH (FOR BLOOD PRESSURE SUPPORT)
PREFILLED_SYRINGE | INTRAVENOUS | Status: AC
  Filled 2023-04-11: qty 10

## 2023-04-11 MED ORDER — TOLTERODINE TARTRATE ER 4 MG PO CP24: 4.0000 mg | ORAL_CAPSULE | Freq: Every day | ORAL | 0 refills | Status: DC | PRN

## 2023-04-11 MED ORDER — ONDANSETRON HCL 4 MG PO TABS: 4.0000 mg | ORAL_TABLET | Freq: Every day | ORAL | 1 refills | Status: DC | PRN

## 2023-04-11 MED ORDER — SUCCINYLCHOLINE CHLORIDE 200 MG/10ML IV SOSY
PREFILLED_SYRINGE | INTRAVENOUS | Status: DC | PRN
  Administered 2023-04-11: 160 mg via INTRAVENOUS

## 2023-04-11 MED ORDER — LIDOCAINE HCL (PF) 2 % IJ SOLN
INTRAMUSCULAR | Status: AC
  Filled 2023-04-11: qty 20

## 2023-04-11 MED ORDER — ROCURONIUM BROMIDE 10 MG/ML (PF) SYRINGE
PREFILLED_SYRINGE | INTRAVENOUS | Status: DC | PRN
  Administered 2023-04-11: 70 mg via INTRAVENOUS
  Administered 2023-04-11: 20 mg via INTRAVENOUS

## 2023-04-11 MED ORDER — DEXAMETHASONE SODIUM PHOSPHATE 10 MG/ML IJ SOLN
INTRAMUSCULAR | Status: DC | PRN
  Administered 2023-04-11: 10 mg via INTRAVENOUS

## 2023-04-11 MED ORDER — ACETAMINOPHEN 500 MG PO TABS: 1000.0000 mg | ORAL_TABLET | Freq: Once | ORAL | Status: DC | PRN

## 2023-04-11 MED ORDER — AMISULPRIDE (ANTIEMETIC) 5 MG/2ML IV SOLN: 10.0000 mg | Freq: Once | INTRAVENOUS | Status: AC

## 2023-04-11 MED ORDER — CIPROFLOXACIN IN D5W 400 MG/200ML IV SOLN
400.0000 mg | INTRAVENOUS | Status: AC
  Administered 2023-04-11: 400 mg via INTRAVENOUS
  Filled 2023-04-11: qty 200

## 2023-04-11 MED ORDER — PHENYLEPHRINE HCL (PRESSORS) 10 MG/ML IV SOLN
INTRAVENOUS | Status: AC
  Filled 2023-04-11: qty 1

## 2023-04-11 MED ORDER — EPHEDRINE SULFATE-NACL 50-0.9 MG/10ML-% IV SOSY
PREFILLED_SYRINGE | INTRAVENOUS | Status: DC | PRN
  Administered 2023-04-11: 5 mg via INTRAVENOUS

## 2023-04-11 MED ORDER — SODIUM CHLORIDE 0.9 % IR SOLN
Status: DC | PRN
  Administered 2023-04-11: 1000 mL

## 2023-04-11 MED ORDER — MIDAZOLAM HCL 5 MG/5ML IJ SOLN
INTRAMUSCULAR | Status: DC | PRN
  Administered 2023-04-11: 2 mg via INTRAVENOUS

## 2023-04-11 MED ORDER — EPHEDRINE 5 MG/ML INJ
INTRAVENOUS | Status: AC
  Filled 2023-04-11: qty 5

## 2023-04-11 MED ORDER — AMISULPRIDE (ANTIEMETIC) 5 MG/2ML IV SOLN
INTRAVENOUS | Status: AC
  Administered 2023-04-11: 10 mg via INTRAVENOUS
  Filled 2023-04-11: qty 4

## 2023-04-11 MED ORDER — OXYCODONE HCL 5 MG/5ML PO SOLN: 5.0000 mg | Freq: Once | ORAL | Status: DC | PRN

## 2023-04-11 MED ORDER — MIDAZOLAM HCL 2 MG/2ML IJ SOLN
INTRAMUSCULAR | Status: AC
  Filled 2023-04-11: qty 2

## 2023-04-11 MED ORDER — CHLORHEXIDINE GLUCONATE 0.12 % MT SOLN
15.0000 mL | Freq: Once | OROMUCOSAL | Status: AC
  Administered 2023-04-11: 15 mL via OROMUCOSAL

## 2023-04-11 MED ORDER — DEXAMETHASONE SODIUM PHOSPHATE 10 MG/ML IJ SOLN
INTRAMUSCULAR | Status: AC
  Filled 2023-04-11: qty 4

## 2023-04-11 MED ORDER — FENTANYL CITRATE (PF) 100 MCG/2ML IJ SOLN
INTRAMUSCULAR | Status: DC | PRN
  Administered 2023-04-11 (×2): 50 ug via INTRAVENOUS

## 2023-04-11 MED ORDER — ORAL CARE MOUTH RINSE: 15.0000 mL | Freq: Once | OROMUCOSAL | Status: AC

## 2023-04-11 MED ORDER — FENTANYL CITRATE (PF) 100 MCG/2ML IJ SOLN
INTRAMUSCULAR | Status: AC
  Filled 2023-04-11: qty 2

## 2023-04-11 SURGICAL SUPPLY — 22 items
BAG URO CATCHER STRL LF (MISCELLANEOUS) ×1 IMPLANT
BASKET ZERO TIP NITINOL 2.4FR (BASKET) IMPLANT
BSKT STON RTRVL ZERO TP 2.4FR (BASKET) ×1
CATH URETL OPEN 5X70 (CATHETERS) ×1 IMPLANT
CLOTH BEACON ORANGE TIMEOUT ST (SAFETY) ×1 IMPLANT
DRSG TEGADERM 2-3/8X2-3/4 SM (GAUZE/BANDAGES/DRESSINGS) IMPLANT
EXTRACTOR STONE 1.7FRX115CM (UROLOGICAL SUPPLIES) IMPLANT
GLOVE BIO SURGEON STRL SZ 6.5 (GLOVE) ×1 IMPLANT
GOWN STRL REUS W/ TWL LRG LVL3 (GOWN DISPOSABLE) ×1 IMPLANT
GOWN STRL REUS W/TWL LRG LVL3 (GOWN DISPOSABLE) ×1
GUIDEWIRE STR DUAL SENSOR (WIRE) ×1 IMPLANT
KIT TURNOVER KIT A (KITS) IMPLANT
MANIFOLD NEPTUNE II (INSTRUMENTS) ×1 IMPLANT
MAT HALF PREVALON HALF STRYKER (MISCELLANEOUS) IMPLANT
PACK CYSTO (CUSTOM PROCEDURE TRAY) ×1 IMPLANT
SHEATH NAVIGATOR HD 11/13X28 (SHEATH) IMPLANT
SHEATH NAVIGATOR HD 11/13X36 (SHEATH) IMPLANT
STENT URET 6FRX26 CONTOUR (STENTS) IMPLANT
TRACTIP FLEXIVA PULS ID 200XHI (Laser) IMPLANT
TRACTIP FLEXIVA PULSE ID 200 (Laser) ×1
TUBING CONNECTING 10 (TUBING) ×1 IMPLANT
TUBING UROLOGY SET (TUBING) ×1 IMPLANT

## 2023-04-11 NOTE — Transfer of Care (Signed)
Immediate Anesthesia Transfer of Care Note  Patient: Keeanna Freise  Procedure(s) Performed: Procedure(s) with comments: DIAGNOSTIC CYSTOSCOPY; LEFT URETEROSCOPY AND HOLMIUM LASER LITHOTRIPSY; BILATERAL URETERAL STENT PLACEMENT (Bilateral) - 90 MINUTES  Patient Location: PACU  Anesthesia Type:General  Level of Consciousness:  sedated, patient cooperative and responds to stimulation  Airway & Oxygen Therapy:Patient Spontanous Breathing and Patient connected to face mask oxgen  Post-op Assessment:  Report given to PACU RN and Post -op Vital signs reviewed and stable  Post vital signs:  Reviewed and stable  Last Vitals:  Vitals:   04/11/23 0600  BP: 130/89  Pulse: 74  Resp: 16  Temp: 37.2 C  SpO2: 96%    Complications: No apparent anesthesia complications

## 2023-04-11 NOTE — Op Note (Signed)
Preoperative diagnosis: gross hematuria, bilateral renal calculi  Postoperative diagnosis: gross hematuria, bilateral renal calculi  Procedure:  Diagnostic cystoscopy left ureteroscopy, laser lithotripsy, basket stone extraction bilateral 51F x 26cm ureteral stent placement - no tether bilateral retrograde pyelography with interpretation  Surgeon: Kasandra Knudsen, MD  Anesthesia: General  Complications: None  Intraoperative findings:  Normal urethra Bilateral orthotropic ureteral orifices bilateral retrograde pyelography demonstrated a filling defect within the bilateral ureter consistent with the patient's known calculus without other abnormalities. Bladder mucosa with evidence of cystitis cystica. No masses. No trabeculation.  EBL: Minimal  Specimens: left renal calculus  Disposition of specimens: [facility] for stone analysis  Indication: [name] is a 57 y.o.   patient with gross hematuria found to have bilateral renal calculi.  After reviewing the management options for treatment, the patient elected to proceed with the above surgical procedure(s). We have discussed the potential benefits and risks of the procedure, side effects of the proposed treatment, the likelihood of the patient achieving the goals of the procedure, and any potential problems that might occur during the procedure or recuperation. Informed consent has been obtained.   Description of procedure:  The patient was taken to the operating room and general anesthesia was induced.  The patient was placed in the dorsal lithotomy position, prepped and draped in the usual sterile fashion, and preoperative antibiotics were administered. A preoperative time-out was performed.   Cystourethroscopy was performed.  The patient's urethra was examined and was normal.  The bladder was then systematically examined in its entirety. There was no evidence for any bladder tumors or stones.  There was  evidence of cystitis cystica.  Attention then turned to the left ureteral orifice and a ureteral catheter was used to intubate the ureteral orifice.  Omnipaque contrast was injected through the ureteral catheter and a retrograde pyelogram was performed with findings as dictated above.  A 0.38 sensor guidewire was then advanced up the left ureter into the renal pelvis under fluoroscopic guidance.  A second sensor wire was placed alongside the first sensor wire and advanced to the kidney with fluoroscopic guidance and secured as a safety wire.  The ureteral access sheath was then placed over the working wire and advanced to the proximal ureter with fluoroscopic guidance.  The inner sheath and wire were removed.  Flexible ureteroscopy then took place.  A large stone was encountered in the lower pole.  The stone was then dusted with the 242 micron holmium laser fiber.  Some of the larger fragments were removed with a 0 tip basket.  All remaining pieces appear to be less than 2 mm in size. Reinspection of the ureter revealed no remaining visible stones or fragments.   The ureteroscope was then removed and unison with the access sheath taking care to examine the ureter on the way out.  No trauma was noted to the ureter.    The wire was then backloaded through the cystoscope and a ureteral stent was advance over the wire using Seldinger technique.  The stent was positioned appropriately under fluoroscopic and cystoscopic guidance.  The wire was then removed with an adequate stent curl noted in the renal pelvis as well as in the bladder.  Attention turned to the right side.  An open-ended ureteral catheter was used to intubate the right ureteral orifice and a retrograde pyelogram was obtained.  There is no filling defect noted.  The stone was not clearly visible on Spot fluoroscopy either.  A sensor wire  was advanced through the open-ended ureteral catheter up to the kidney with fluoroscopy and the open-ended  catheter was removed.  A second sensor was placed alongside the for sensor wire and secured as a safety wire.  Attempts at placing the ureteral access sheath were only successful to distal to mid ureter.  The inner sheath went easily however the outer sheath met more resistance.  Given the size of the stone decision was made to leave a stent and return in 2 weeks for staged ureteroscopy.  The cystoscope was advanced over the wire.  A 6 French by 26 cm double-J ureteral stent without tether was placed in standard fashion. The bladder was then emptied and the procedure ended.  The patient appeared to tolerate the procedure well and without complications.  The patient was able to be awakened and transferred to the recovery unit in satisfactory condition.   Disposition: No tether was left on either stent.  Patient has staged ureteroscopy scheduled for 04/25/2023.

## 2023-04-11 NOTE — Discharge Instructions (Addendum)
DISCHARGE INSTRUCTIONS FOR KIDNEY STONE/URETERAL STENT   MEDICATIONS:  1. Resume all your other meds from home  2. AZO over the counter can help with the burning/stinging when you urinate. 3. Oxycodone is for moderate/severe pain, otherwise taking up to 1000 mg every 6 hours of plain Tylenol will help treat your pain.   4. Tamsulosin can help with stent discomfort 5. Detrol LA can help with overactive bladder due to the stent 6. Cipro 250mg  daily with help with infection prevention   ACTIVITY:  1. No strenuous activity x 1week  2. No driving while on narcotic pain medications  3. Drink plenty of water  4. Continue to walk at home - you can still get blood clots when you are at home, so keep active, but don't over do it.  5. May return to work/school tomorrow or when you feel ready   BATHING:  1. You can shower and we recommend daily showers     SIGNS/SYMPTOMS TO CALL:  Please call us if you have a fever greater than 101.5, uncontrolled nausea/vomiting, uncontrolled pain, dizziness, unable to urinate, bloody urine, chest pain, shortness of breath, leg swelling, leg pain, redness around wound, drainage from wound, or any other concerns or questions.   You can reach [place] at [phone].   FOLLOW-UP:  1. Your next surgery is scheduled for 04/25/23.

## 2023-04-11 NOTE — OR Nursing (Signed)
Stone taken by Dr. Arita Miss. Some stone sent with patient.

## 2023-04-11 NOTE — Anesthesia Procedure Notes (Signed)
Procedure Name: Intubation Date/Time: 04/11/2023 7:35 AM  Performed by: Theodosia Quay, CRNAPre-anesthesia Checklist: Patient identified, Emergency Drugs available, Suction available, Patient being monitored and Timeout performed Patient Re-evaluated:Patient Re-evaluated prior to induction Oxygen Delivery Method: Circle system utilized Preoxygenation: Pre-oxygenation with 100% oxygen Induction Type: IV induction Ventilation: Mask ventilation without difficulty Laryngoscope Size: Mac and 3 Grade View: Grade II Tube type: Oral Tube size: 7.0 mm Number of attempts: 1 Airway Equipment and Method: Stylet Placement Confirmation: ETT inserted through vocal cords under direct vision, positive ETCO2, CO2 detector and breath sounds checked- equal and bilateral Secured at: 21 cm Tube secured with: Tape Dental Injury: Teeth and Oropharynx as per pre-operative assessment  Difficulty Due To: Difficulty was anticipated Comments: ATOI

## 2023-04-11 NOTE — Progress Notes (Signed)
Patient requested nausea med for home use writer attempted page Dr.Pace no response so called [facility] talked with answering service they said would relay message to Md. No further changes noted.

## 2023-04-11 NOTE — Interval H&P Note (Signed)
History and Physical Interval Note:  04/11/2023 7:20 AM  [name]  has presented today for surgery, with the diagnosis of GROSS HEMATURIA, BILATERAL RENAL CALCULUS.  The various methods of treatment have been discussed with the patient and family. After consideration of risks, benefits and other options for treatment, the patient has consented to  Procedure(s) with comments: DIAGNOSTIC CYSTOSCOPY, BILATERAL URETEROSCOPY, HOLMIUM LASER LITHOTRIPSY, AND BILATERAL URETERAL STENT PLACEMENT (Bilateral) - 90 MINUTES as a surgical intervention.  The patient's history has been reviewed, patient examined, no change in status, stable for surgery.  I have reviewed the patient's chart and labs.  Questions were answered to the patient's satisfaction.     Eryn Krejci D Neill Jurewicz

## 2023-04-12 ENCOUNTER — Encounter (HOSPITAL_COMMUNITY): Payer: Self-pay | Admitting: Urology

## 2023-04-14 NOTE — Anesthesia Postprocedure Evaluation (Signed)
Anesthesia Post Note  Patient: [name]  Procedure(s) Performed: DIAGNOSTIC CYSTOSCOPY; LEFT URETEROSCOPY AND HOLMIUM LASER LITHOTRIPSY; BILATERAL URETERAL STENT PLACEMENT (Bilateral)     Patient location during evaluation: PACU Anesthesia Type: General Level of consciousness: awake and alert Pain management: pain level controlled Vital Signs Assessment: post-procedure vital signs reviewed and stable Respiratory status: spontaneous breathing, nonlabored ventilation and respiratory function stable Cardiovascular status: blood pressure returned to baseline and stable Postop Assessment: no apparent nausea or vomiting Anesthetic complications: yes   Encounter Notable Events  Notable Event Outcome Phase Comment  Difficult to intubate - expected  Intraprocedure Filed from anesthesia note documentation.    Last Vitals:  Vitals:   04/11/23 0926 04/11/23 1002  BP: (!) 150/103 (!) 163/98  Pulse: 80 81  Resp: (!) 21 20  Temp: (!) 36.4 C 36.8 C  SpO2: 94% 96%    Last Pain:  Vitals:   04/11/23 1002  TempSrc: Oral  PainSc: 0-No pain                 Laurna Shetley

## 2023-04-21 ENCOUNTER — Other Ambulatory Visit: Payer: Self-pay

## 2023-04-21 ENCOUNTER — Encounter (HOSPITAL_COMMUNITY): Payer: Self-pay | Admitting: Urology

## 2023-04-21 NOTE — Progress Notes (Signed)
For Anesthesia: PCP - Jarrett Soho, PA-C  Cardiologist - Chilton Si, MD    Chest x-ray - greater than 1 year EKG - 02/10/23 in [facility] Stress Test - N/A ECHO - 12/21/22 in [facility] Cardiac Cath - N/A Pacemaker/ICD device last checked: N/A Pacemaker orders received: N/A Device Rep notified: N/A  Spinal Cord Stimulator: N/A  Sleep Study - 03/21/23 in [facility] CPAP -   Fasting Blood Sugar - N/A Checks Blood Sugar __N/A___ times a day Date and result of last Hgb A1c-N/A  Last dose of GLP1 agonist- N/A GLP1 instructions: N/A  Last dose of SGLT-2 inhibitors- N/A SGLT-2 instructions:N/A  Blood Thinner Instructions: Eliquis last dose 04/22/23 at 11 PM Aspirin Instructions: N/A Last Dose:N/A  Activity level: Unable to go up a flight of stairs without chest pain and/or shortness of breath     Anesthesia review: see note from Shanda Bumps 04/07/23  Patient denies shortness of breath, fever, cough and chest pain during pre op phone call.   Patient verbalized understanding of instructions reviewed via telephone.

## 2023-04-25 ENCOUNTER — Ambulatory Visit (HOSPITAL_COMMUNITY)
Admission: RE | Admit: 2023-04-25 | Discharge: 2023-04-25 | Disposition: A | Discharge status: Home / Self Care | Payer: Federal, State, Local not specified - PPO | Attending: Urology | Admitting: Urology

## 2023-04-25 ENCOUNTER — Ambulatory Visit (HOSPITAL_COMMUNITY): Payer: Federal, State, Local not specified - PPO | Admitting: Certified Registered Nurse Anesthetist

## 2023-04-25 ENCOUNTER — Encounter (HOSPITAL_COMMUNITY)
Admission: RE | Disposition: A | Discharge status: Home / Self Care | Payer: Self-pay | Source: Home / Self Care | Attending: Urology

## 2023-04-25 ENCOUNTER — Ambulatory Visit (HOSPITAL_COMMUNITY): Payer: Federal, State, Local not specified - PPO

## 2023-04-25 ENCOUNTER — Encounter (HOSPITAL_COMMUNITY): Payer: Self-pay | Admitting: Urology

## 2023-04-25 DIAGNOSIS — K439 Ventral hernia without obstruction or gangrene: Secondary | ICD-10-CM | POA: Insufficient documentation

## 2023-04-25 DIAGNOSIS — I4891 Unspecified atrial fibrillation: Secondary | ICD-10-CM | POA: Diagnosis not present

## 2023-04-25 DIAGNOSIS — G4733 Obstructive sleep apnea (adult) (pediatric): Secondary | ICD-10-CM | POA: Diagnosis not present

## 2023-04-25 DIAGNOSIS — R310 Gross hematuria: Secondary | ICD-10-CM | POA: Diagnosis not present

## 2023-04-25 DIAGNOSIS — Z6841 Body Mass Index (BMI) 40.0 and over, adult: Secondary | ICD-10-CM | POA: Diagnosis not present

## 2023-04-25 DIAGNOSIS — D649 Anemia, unspecified: Secondary | ICD-10-CM

## 2023-04-25 DIAGNOSIS — I10 Essential (primary) hypertension: Secondary | ICD-10-CM | POA: Insufficient documentation

## 2023-04-25 DIAGNOSIS — I700 Atherosclerosis of aorta: Secondary | ICD-10-CM | POA: Insufficient documentation

## 2023-04-25 DIAGNOSIS — Z9989 Dependence on other enabling machines and devices: Secondary | ICD-10-CM | POA: Diagnosis not present

## 2023-04-25 DIAGNOSIS — Z79899 Other long term (current) drug therapy: Secondary | ICD-10-CM | POA: Insufficient documentation

## 2023-04-25 DIAGNOSIS — N132 Hydronephrosis with renal and ureteral calculous obstruction: Secondary | ICD-10-CM | POA: Diagnosis not present

## 2023-04-25 DIAGNOSIS — N200 Calculus of kidney: Secondary | ICD-10-CM | POA: Diagnosis not present

## 2023-04-25 DIAGNOSIS — Z9884 Bariatric surgery status: Secondary | ICD-10-CM | POA: Insufficient documentation

## 2023-04-25 DIAGNOSIS — I119 Hypertensive heart disease without heart failure: Secondary | ICD-10-CM | POA: Diagnosis not present

## 2023-04-25 HISTORY — PX: CYSTOSCOPY/URETEROSCOPY/HOLMIUM LASER/STENT PLACEMENT: SHX6546

## 2023-04-25 LAB — CBC
HCT: 44.3 % (ref 36.0–46.0)
Hemoglobin: 13.8 g/dL (ref 12.0–15.0)
MCH: 28.8 pg (ref 26.0–34.0)
MCHC: 31.2 g/dL (ref 30.0–36.0)
MCV: 92.5 fL (ref 80.0–100.0)
Platelets: 311 10*3/uL (ref 150–400)
RBC: 4.79 MIL/uL (ref 3.87–5.11)
RDW: 14.9 % (ref 11.5–15.5)
WBC: 11.9 10*3/uL — ABNORMAL HIGH (ref 4.0–10.5)
nRBC: 0 % (ref 0.0–0.2)

## 2023-04-25 LAB — BASIC METABOLIC PANEL
Anion gap: 10 (ref 5–15)
BUN: 17 mg/dL (ref 6–20)
CO2: 26 mmol/L (ref 22–32)
Calcium: 8.7 mg/dL — ABNORMAL LOW (ref 8.9–10.3)
Chloride: 104 mmol/L (ref 98–111)
Creatinine, Ser: 0.92 mg/dL (ref 0.44–1.00)
GFR, Estimated: >60 mL/min (ref >60)
Glucose, Bld: 100 mg/dL — ABNORMAL HIGH (ref 70–99)
Potassium: 3.4 mmol/L — ABNORMAL LOW (ref 3.5–5.1)
Sodium: 140 mmol/L (ref 135–145)

## 2023-04-25 SURGERY — CYSTOSCOPY/URETEROSCOPY/HOLMIUM LASER/STENT PLACEMENT
Anesthesia: General | Site: Ureter | Laterality: Bilateral

## 2023-04-25 MED ORDER — FENTANYL CITRATE (PF) 100 MCG/2ML IJ SOLN
INTRAMUSCULAR | Status: DC | PRN
  Administered 2023-04-25 (×2): 50 ug via INTRAVENOUS

## 2023-04-25 MED ORDER — SODIUM CHLORIDE 0.9 % IR SOLN
Status: DC | PRN
  Administered 2023-04-25: 6000 mL

## 2023-04-25 MED ORDER — ONDANSETRON HCL 4 MG/2ML IJ SOLN
INTRAMUSCULAR | Status: DC | PRN
  Administered 2023-04-25: 4 mg via INTRAVENOUS

## 2023-04-25 MED ORDER — DEXAMETHASONE SODIUM PHOSPHATE 10 MG/ML IJ SOLN
INTRAMUSCULAR | Status: AC
  Filled 2023-04-25: qty 1

## 2023-04-25 MED ORDER — MIDAZOLAM HCL 2 MG/2ML IJ SOLN
INTRAMUSCULAR | Status: AC
  Filled 2023-04-25: qty 2

## 2023-04-25 MED ORDER — PROMETHAZINE HCL 25 MG/ML IJ SOLN: 6.2500 mg | INTRAMUSCULAR | Status: DC | PRN

## 2023-04-25 MED ORDER — ONDANSETRON HCL 4 MG/2ML IJ SOLN
INTRAMUSCULAR | Status: AC
  Filled 2023-04-25: qty 2

## 2023-04-25 MED ORDER — LIDOCAINE 2% (20 MG/ML) 5 ML SYRINGE
INTRAMUSCULAR | Status: DC | PRN
  Administered 2023-04-25: 60 mg via INTRAVENOUS

## 2023-04-25 MED ORDER — HYDROMORPHONE HCL 1 MG/ML IJ SOLN
INTRAMUSCULAR | Status: AC
  Filled 2023-04-25: qty 1

## 2023-04-25 MED ORDER — KETOROLAC TROMETHAMINE 30 MG/ML IJ SOLN: 30.0000 mg | Freq: Once | INTRAMUSCULAR | Status: AC

## 2023-04-25 MED ORDER — LACTATED RINGERS IV SOLN: INTRAVENOUS | Status: DC

## 2023-04-25 MED ORDER — MIDAZOLAM HCL 5 MG/5ML IJ SOLN
INTRAMUSCULAR | Status: DC | PRN
  Administered 2023-04-25: 2 mg via INTRAVENOUS

## 2023-04-25 MED ORDER — AMISULPRIDE (ANTIEMETIC) 5 MG/2ML IV SOLN: 10.0000 mg | Freq: Once | INTRAVENOUS | Status: DC | PRN

## 2023-04-25 MED ORDER — CIPROFLOXACIN IN D5W 400 MG/200ML IV SOLN
400.0000 mg | INTRAVENOUS | Status: AC
  Administered 2023-04-25: 400 mg via INTRAVENOUS

## 2023-04-25 MED ORDER — ORAL CARE MOUTH RINSE: 15.0000 mL | Freq: Once | OROMUCOSAL | Status: AC

## 2023-04-25 MED ORDER — OXYCODONE HCL 5 MG PO TABS: 5.0000 mg | ORAL_TABLET | Freq: Three times a day (TID) | ORAL | 0 refills | Status: DC | PRN

## 2023-04-25 MED ORDER — PROPOFOL 10 MG/ML IV BOLUS
INTRAVENOUS | Status: DC | PRN
  Administered 2023-04-25: 200 mg via INTRAVENOUS

## 2023-04-25 MED ORDER — OXYCODONE HCL 5 MG/5ML PO SOLN: 5.0000 mg | Freq: Once | ORAL | Status: AC | PRN

## 2023-04-25 MED ORDER — PROPOFOL 10 MG/ML IV BOLUS
INTRAVENOUS | Status: AC
  Filled 2023-04-25: qty 20

## 2023-04-25 MED ORDER — KETOROLAC TROMETHAMINE 30 MG/ML IJ SOLN
INTRAMUSCULAR | Status: AC
  Administered 2023-04-25: 30 mg via INTRAVENOUS
  Filled 2023-04-25: qty 1

## 2023-04-25 MED ORDER — CIPROFLOXACIN HCL 500 MG PO TABS: 500.0000 mg | ORAL_TABLET | Freq: Once | ORAL | 0 refills | Status: AC

## 2023-04-25 MED ORDER — HYDROMORPHONE HCL 1 MG/ML IJ SOLN
INTRAMUSCULAR | Status: AC
  Administered 2023-04-25: 0.5 mg via INTRAVENOUS
  Filled 2023-04-25: qty 1

## 2023-04-25 MED ORDER — ACETAMINOPHEN 500 MG PO TABS: 1000.0000 mg | ORAL_TABLET | Freq: Once | ORAL | Status: AC

## 2023-04-25 MED ORDER — OXYCODONE HCL 5 MG PO TABS
ORAL_TABLET | ORAL | Status: AC
  Filled 2023-04-25: qty 1

## 2023-04-25 MED ORDER — DEXAMETHASONE SODIUM PHOSPHATE 10 MG/ML IJ SOLN
INTRAMUSCULAR | Status: DC | PRN
  Administered 2023-04-25: 4 mg via INTRAVENOUS

## 2023-04-25 MED ORDER — ACETAMINOPHEN 500 MG PO TABS
ORAL_TABLET | ORAL | Status: AC
  Administered 2023-04-25: 1000 mg via ORAL
  Filled 2023-04-25: qty 2

## 2023-04-25 MED ORDER — OXYCODONE HCL 5 MG PO TABS
5.0000 mg | ORAL_TABLET | Freq: Once | ORAL | Status: AC | PRN
  Administered 2023-04-25: 5 mg via ORAL

## 2023-04-25 MED ORDER — CHLORHEXIDINE GLUCONATE 0.12 % MT SOLN
15.0000 mL | Freq: Once | OROMUCOSAL | Status: AC
  Administered 2023-04-25: 15 mL via OROMUCOSAL

## 2023-04-25 MED ORDER — FENTANYL CITRATE (PF) 100 MCG/2ML IJ SOLN
INTRAMUSCULAR | Status: AC
  Filled 2023-04-25: qty 2

## 2023-04-25 MED ORDER — HYDROMORPHONE HCL 1 MG/ML IJ SOLN
0.2500 mg | INTRAMUSCULAR | Status: DC | PRN
  Administered 2023-04-25: 0.5 mg via INTRAVENOUS

## 2023-04-25 SURGICAL SUPPLY — 26 items
BAG URO CATCHER STRL LF (MISCELLANEOUS) ×1 IMPLANT
BASKET ZERO TIP NITINOL 2.4FR (BASKET) IMPLANT
BSKT STON RTRVL ZERO TP 2.4FR (BASKET) ×1
CATH URETL OPEN 5X70 (CATHETERS) ×1 IMPLANT
CLOTH BEACON ORANGE TIMEOUT ST (SAFETY) ×1 IMPLANT
DRSG TEGADERM 2-3/8X2-3/4 SM (GAUZE/BANDAGES/DRESSINGS) IMPLANT
EXTRACTOR STONE 1.7FRX115CM (UROLOGICAL SUPPLIES) IMPLANT
FIBER LASER MOSES 200 DFL (Laser) IMPLANT
FIBER LASER MOSES 365 DFL (Laser) IMPLANT
GLOVE BIO SURGEON STRL SZ 6.5 (GLOVE) ×1 IMPLANT
GOWN STRL REUS W/ TWL LRG LVL3 (GOWN DISPOSABLE) ×1 IMPLANT
GOWN STRL REUS W/TWL LRG LVL3 (GOWN DISPOSABLE) ×1
GUIDEWIRE STR DUAL SENSOR (WIRE) ×1 IMPLANT
KIT TURNOVER KIT A (KITS) IMPLANT
LASER FIB FLEXIVA PULSE ID 365 (Laser) IMPLANT
MANIFOLD NEPTUNE II (INSTRUMENTS) ×1 IMPLANT
MAT PREVALON FULL STRYKER (MISCELLANEOUS) IMPLANT
PACK CYSTO (CUSTOM PROCEDURE TRAY) ×1 IMPLANT
SHEATH NAVIGATOR HD 11/13X28 (SHEATH) IMPLANT
SHEATH NAVIGATOR HD 11/13X36 (SHEATH) IMPLANT
SHEATH NAVIGATOR HD 12/14X36 (SHEATH) IMPLANT
STENT URET 6FRX26 CONTOUR (STENTS) IMPLANT
TRACTIP FLEXIVA PULS ID 200XHI (Laser) IMPLANT
TRACTIP FLEXIVA PULSE ID 200 (Laser)
TUBING CONNECTING 10 (TUBING) ×1 IMPLANT
TUBING UROLOGY SET (TUBING) ×1 IMPLANT

## 2023-04-25 NOTE — Anesthesia Procedure Notes (Signed)
Procedure Name: LMA Insertion Date/Time: 04/25/2023 9:25 AM  Performed by: Wynonia Sours, CRNAPre-anesthesia Checklist: Patient identified, Emergency Drugs available, Suction available, Patient being monitored and Timeout performed Patient Re-evaluated:Patient Re-evaluated prior to induction Oxygen Delivery Method: Circle system utilized Preoxygenation: Pre-oxygenation with 100% oxygen Induction Type: IV induction LMA: LMA with gastric port inserted LMA Size: 4.0 Number of attempts: 1 Placement Confirmation: positive ETCO2 Tube secured with: Tape Dental Injury: Teeth and Oropharynx as per pre-operative assessment

## 2023-04-25 NOTE — Anesthesia Preprocedure Evaluation (Signed)
Anesthesia Evaluation  Patient identified by MRN, date of birth, ID band Patient awake    Reviewed: Allergy & Precautions, NPO status , Patient's Chart, lab work & pertinent test results  History of Anesthesia Complications Negative for: history of anesthetic complications  Airway Mallampati: IV  TM Distance: >3 FB Neck ROM: Full    Dental  (+) Teeth Intact, Dental Advisory Given,    Pulmonary sleep apnea and Continuous Positive Airway Pressure Ventilation , neg COPD, neg recent URI   breath sounds clear to auscultation       Cardiovascular hypertension, Pt. on medications and Pt. on home beta blockers (-) angina (-) Past MI  Rhythm:Irregular   1. Appears to be in Afib during study. Left ventricular ejection  fraction, by estimation, is 60 to 65%. The left ventricle has normal  function. The left ventricle has no regional wall motion abnormalities.  There is moderate left ventricular hypertrophy.   Left ventricular diastolic parameters are indeterminate.   2. Right ventricular systolic function is normal. The right ventricular  size is mildly enlarged. The estimated right ventricular systolic pressure  is 29.6 mmHg.   3. The mitral valve is degenerative. Trivial mitral valve regurgitation.  Severe mitral annular calcification.   4. The aortic valve was not well visualized. Aortic valve regurgitation  is not visualized. Mild aortic valve stenosis.   5. The inferior vena cava is normal in size with greater than 50%  respiratory variability, suggesting right atrial pressure of 3 mmHg.     Neuro/Psych  Headaches PSYCHIATRIC DISORDERS Anxiety        GI/Hepatic Neg liver ROS, hiatal hernia,,,  Endo/Other    Morbid obesityLab Results      Component                Value               Date                      HGBA1C                   5.8 (H)             12/23/2022             Renal/GU GROSS HEMATURIA, BILATERAL RENAL CALCULUS      Musculoskeletal  (+) Arthritis ,    Abdominal  (+) + obese  Peds  Hematology negative hematology ROS (+) Lab Results      Component                Value               Date                      WBC                      11.1 (H)            04/07/2023                HGB                      15.1 (H)            04/07/2023                HCT  48.8 (H)            04/07/2023                MCV                      93.8                04/07/2023                PLT                      296                 04/07/2023              Anesthesia Other Findings   Reproductive/Obstetrics                             Anesthesia Physical Anesthesia Plan  ASA: 3  Anesthesia Plan: General   Post-op Pain Management: Minimal or no pain anticipated   Induction: Intravenous  PONV Risk Score and Plan: 3 and Ondansetron, Dexamethasone and Midazolam  Airway Management Planned: LMA  Additional Equipment: None  Intra-op Plan:   Post-operative Plan: Extubation in OR  Informed Consent: I have reviewed the patients History and Physical, chart, labs and discussed the procedure including the risks, benefits and alternatives for the proposed anesthesia with the patient or authorized representative who has indicated his/her understanding and acceptance.     Dental advisory given  Plan Discussed with: CRNA  Anesthesia Plan Comments: (See PAT note 04/07/2023)       Anesthesia Quick Evaluation

## 2023-04-25 NOTE — Anesthesia Postprocedure Evaluation (Signed)
Anesthesia Post Note  Patient: [name]  Procedure(s) Performed: DIAGNOSTIC CYSTOSCOPY, RIGHT URETEROSCOPY, HOLMIUM LASER LITHOTRIPSY, AND RIGHT URETERAL STENT EXCHANGE, LEFT STENT REMOVAL (Bilateral: Ureter)     Patient location during evaluation: PACU Anesthesia Type: General Level of consciousness: awake and alert Pain management: pain level controlled Vital Signs Assessment: post-procedure vital signs reviewed and stable Respiratory status: spontaneous breathing, nonlabored ventilation and respiratory function stable Cardiovascular status: blood pressure returned to baseline and stable Postop Assessment: no apparent nausea or vomiting Anesthetic complications: no   No notable events documented.  Last Vitals:  Vitals:   04/25/23 1129 04/25/23 1228  BP: 136/81 122/77  Pulse:  84  Resp:  17  Temp: 36.9 C   SpO2:  97%    Last Pain:  Vitals:   04/25/23 1228  TempSrc:   PainSc: 2                  Lowella Curb

## 2023-04-25 NOTE — Transfer of Care (Signed)
Immediate Anesthesia Transfer of Care Note  Patient: [name]  Procedure(s) Performed: DIAGNOSTIC CYSTOSCOPY, RIGHT URETEROSCOPY, HOLMIUM LASER LITHOTRIPSY, AND RIGHT URETERAL STENT EXCHANGE, LEFT STENT REMOVAL (Bilateral: Ureter)  Patient Location: PACU  Anesthesia Type:General  Level of Consciousness: awake, alert , and patient cooperative  Airway & Oxygen Therapy: Patient Spontanous Breathing and Patient connected to face mask oxygen  Post-op Assessment: Report given to RN and Post -op Vital signs reviewed and stable  Post vital signs: Reviewed and stable  Last Vitals:  Vitals Value Taken Time  BP    Temp    Pulse 69 04/25/23 1037  Resp 19 04/25/23 1037  SpO2 100 % 04/25/23 1037  Vitals shown include unvalidated device data.  Last Pain:  Vitals:   04/25/23 0741  TempSrc: Oral      Patients Stated Pain Goal: 3 (04/21/23 1457)  Complications: No notable events documented.

## 2023-04-25 NOTE — Discharge Instructions (Signed)
DISCHARGE INSTRUCTIONS FOR KIDNEY STONE/URETERAL STENT   MEDICATIONS:  1. Resume all your other meds from home  2. AZO over the counter can help with the burning/stinging when you urinate. 3. Oxycodone is for moderate/severe pain, otherwise taking up to 1000 mg every 6 hours of plain Tylenol will help treat your pain.   4. Take Cipro one hour prior to removal of your stent.    ACTIVITY:  1. No strenuous activity x 1week  2. No driving while on narcotic pain medications  3. Drink plenty of water  4. Continue to walk at home - you can still get blood clots when you are at home, so keep active, but don't over do it.  5. May return to work/school tomorrow or when you feel ready   BATHING:  1. You can shower and we recommend daily showers  2. You have a string coming from your urethra: The stent string is attached to your ureteral stent. Do not pull on this.   SIGNS/SYMPTOMS TO CALL:  Please call us if you have a fever greater than 101.5, uncontrolled nausea/vomiting, uncontrolled pain, dizziness, unable to urinate, bloody urine, chest pain, shortness of breath, leg swelling, leg pain, redness around wound, drainage from wound, or any other concerns or questions.   You can reach [place] at [phone].   FOLLOW-UP:  1. You have a string attached to your stent, you may remove it on Friday, May 30. To do this, pull the stringsuntil the stent is completely removed. You may feel an odd sensation in your back.

## 2023-04-25 NOTE — Op Note (Signed)
Preoperative diagnosis: right renal calculus  Postoperative diagnosis: right renal calculus  Procedure:  Cystoscopy right ureteroscopy, laser lithotripsy, basket stone extraction right 10F x 26 ureteral stent exchange - with tether  Left ureteral stent removal  Surgeon: Kasandra Knudsen, MD  Anesthesia: General  Complications: None  Intraoperative findings:  Normal urethra Bilateral orthotropic ureteral orifices Bladder mucosa normal without masses  Large right lower pole calculus with infectious matrix  EBL: Minimal  Specimens: none  Disposition of specimens: [facility] for stone analysis  Indication: [name] is a 57 y.o.   patient with bilateral renal calculi who previously underwent left ureteroscopy, laser lithotripsy with stent placement and a right ureteral stent placement.  She is here today for staged procedure.  A 14mm right ureteral stone and associated right symptoms. After reviewing the management options for treatment, the patient elected to proceed with the above surgical procedure(s). We have discussed the potential benefits and risks of the procedure, side effects of the proposed treatment, the likelihood of the patient achieving the goals of the procedure, and any potential problems that might occur during the procedure or recuperation. Informed consent has been obtained.   Description of procedure:  The patient was taken to the operating room and general anesthesia was induced.  The patient was placed in the dorsal lithotomy position, prepped and draped in the usual sterile fashion, and preoperative antibiotics were administered. A preoperative time-out was performed.   Cystourethroscopy was performed.  The patient's urethra was examined and was normal.  he bladder was then systematically examined in its entirety. There was no evidence for any bladder tumors, stones, or other mucosal pathology.    Attention then turned to the right  ureteral orifice and graspers were used to bring the existing right ureteral stent to the urethral meatus.  A 0.38 sensor wire was then advanced through the ureteral stent and up to the kidney with fluoroscopic guidance.  The stent was removed.  It was examined and deemed to be intact.  A second sensor wire was placed alongside the first sensor wire through the cystoscope.  1 wire was secured as a safety wire.  Next the ureteral access sheath was placed over the second wire and advanced the proximal ureter with fluoroscopy.  The inner sheath and wire removed.  Flexible ureteroscopy then took place.    A large lower pole renal calculus consistent with imaging was then seen.  A 200 m Moses holmium laser fiber was then used to fragment the stone.  There is a significant amount of infectious matrix adherent to the stone.  Multiple attempts at basketing this matrix were unsuccessful due to lack of firmness.  Lithotripsy continued until there are no large remaining stone fragments.  The ureteral access sheath was then removed in unison with the ureteroscope taking care to examine the ureter on the way out.  No trauma or injury was noted to the ureter.  No remaining stone fragments were seen in the ureter.  The wire was then backloaded through the cystoscope and a ureteral stent was advance over the wire using Seldinger technique.  The stent was positioned appropriately under fluoroscopic and cystoscopic guidance.  The wire was then removed with an adequate stent curl noted in the renal pelvis as well as in the bladder.  The left ureteral stent was then removed with graspers.  The bladder was then emptied and the procedure ended.  The patient appeared to tolerate the procedure well and without complications.  The  patient was able to be awakened and transferred to the recovery unit in satisfactory condition.   Disposition: The tether of the right ureteral stent was left on and tucked inside the patient's vagina.   Instructions for removing the stent have been provided to the patient.

## 2023-04-25 NOTE — Interval H&P Note (Signed)
History and Physical Interval Note: Patient previously underwent left ureteroscopy and laser lithotripsy for left renal calculus as well as right ureteral stent placement. She now returns for right ureteroscopy with laser lithotripsy and stent exchange.   04/25/2023 8:15 AM  [name]  has presented today for surgery, with the diagnosis of GROSS HEMATURIA BILATERAL RENAL CALCULUS.  The various methods of treatment have been discussed with the patient and family. After consideration of risks, benefits and other options for treatment, the patient has consented to  Procedure(s) with comments: DIAGNOSTIC CYSTOSCOPY, BILATERAL URETEROSCOPY, HOLMIUM LASER LITHOTRIPSY, AND BILATERAL URETERAL STENT EXCHANGE (Bilateral) - 90 MINUTES as a surgical intervention.  The patient's history has been reviewed, patient examined, no change in status, stable for surgery.  I have reviewed the patient's chart and labs.  Questions were answered to the patient's satisfaction.     Marylouise Mallet D Sharicka Pogorzelski

## 2023-04-26 ENCOUNTER — Encounter (HOSPITAL_COMMUNITY): Payer: Self-pay | Admitting: Urology

## 2023-04-28 ENCOUNTER — Telehealth (HOSPITAL_BASED_OUTPATIENT_CLINIC_OR_DEPARTMENT_OTHER): Payer: Self-pay

## 2023-04-28 NOTE — Telephone Encounter (Signed)
Seen by patient [name] on 04/28/2023 10:27 AM; follow up mychart message sent to patient.

## 2023-04-28 NOTE — Telephone Encounter (Addendum)
Left message for patient to call back  ----- Message from Alver Sorrow, NP sent at 04/27/2023  8:36 PM EDT ----- Monitor with predominantly atrial fibrillation. No significant pauses. Average heart rate 78 bpm. Heart rate well controlled.  If she is interested in pursuing restoration of normal rhythm, recommend referral to electrophysiology.   If prefers to continue rate controlled atrial fibrillation, continue current medications.   follow up as scheduled.

## 2023-05-08 DIAGNOSIS — G4733 Obstructive sleep apnea (adult) (pediatric): Secondary | ICD-10-CM | POA: Diagnosis not present

## 2023-05-12 ENCOUNTER — Encounter (HOSPITAL_BASED_OUTPATIENT_CLINIC_OR_DEPARTMENT_OTHER): Payer: Self-pay | Admitting: Family

## 2023-05-12 ENCOUNTER — Ambulatory Visit (HOSPITAL_BASED_OUTPATIENT_CLINIC_OR_DEPARTMENT_OTHER): Payer: Federal, State, Local not specified - PPO | Admitting: Family

## 2023-05-12 VITALS — BP 132/86 | HR 79 | Ht 69.0 in | Wt 398.8 lb

## 2023-05-12 DIAGNOSIS — G4733 Obstructive sleep apnea (adult) (pediatric): Secondary | ICD-10-CM

## 2023-05-12 DIAGNOSIS — I4819 Other persistent atrial fibrillation: Secondary | ICD-10-CM | POA: Diagnosis not present

## 2023-05-12 DIAGNOSIS — I10 Essential (primary) hypertension: Secondary | ICD-10-CM

## 2023-05-12 DIAGNOSIS — I350 Nonrheumatic aortic (valve) stenosis: Secondary | ICD-10-CM

## 2023-05-12 DIAGNOSIS — D6859 Other primary thrombophilia: Secondary | ICD-10-CM | POA: Diagnosis not present

## 2023-05-12 NOTE — Patient Instructions (Addendum)
Medication Instructions:  Continue your current medications   *If you need a refill on your cardiac medications before your next appointment, please call your pharmacy*   Testing/Procedures: Your physician has requested that you have an echocardiogram in January for monitoring of aortic stenosis. Echocardiography is a painless test that uses sound waves to create images of your heart. It provides your doctor with information about the size and shape of your heart and how well your heart's chambers and valves are working. This procedure takes approximately one hour. There are no restrictions for this procedure. Please do NOT wear cologne, perfume, aftershave, or lotions (deodorant is allowed). Please arrive 15 minutes prior to your appointment time.    Follow-Up: At [facility], you and your health needs are our priority.  As part of our continuing mission to provide you with exceptional heart care, we have created designated Provider Care Teams.  These Care Teams include your primary Cardiologist (physician) and Advanced Practice Providers (APPs -  Physician Assistants and Nurse Practitioners) who all work together to provide you with the care you need, when you need it.  We recommend signing up for the patient portal called "MyChart".  Sign up information is provided on this After Visit Summary.  MyChart is used to connect with patients for Virtual Visits (Telemedicine).  Patients are able to view lab/test results, encounter notes, upcoming appointments, etc.  Non-urgent messages can be sent to your provider as well.   To learn more about what you can do with MyChart, go to [email or web address].    Your next appointment:   As scheduled with Dr. Duke Salvia in September  Other Instructions  If you decide you would like a referral to the PREP program, just let us know and we will place referral!

## 2023-05-12 NOTE — Progress Notes (Signed)
Cardiology Office Note:  .   Date:  05/12/2023  ID:  [name], DOB 1965/12/02, MRN [id number] PCP: Jarrett Soho, PA-C  [facility] Providers Cardiologist:  Chilton Si, MD    History of Present Illness: .   [name] is a 57 y.o. female  hx of hypertension, OSA aortic stenosis, morbid obesity, atrial fibrillation last seen 02/10/2023  Echocardiogram 12/21/2022 with new finding of atrial fibrillation.  Her echocardiogram showed normal LVEF and stable mild aortic stenosis.  Seen in clinic 12/23/2022 with asymptomatic rate controlled atrial fibrillation.  Due to CHA2DS2-VASc of 2 she was started on Eliquis 5 mg twice daily.  It [place] home sleep study arranged.  CBC, BMP, TSH, magnesium unremarkable.  At follow-up 01/06/2023 she remained in atrial fibrillation and was set up for cardioversion which was performed 01/25/2023.  Her sleep study did reveal sleep apnea and she was recommended for therapeutic CPAP titration or if not approved to proceed with auto CPAP.  Seen 02/10/2023 with recurrent atrial fibrillation.  14-day monitor placed to assess burden.  She requested to treat sleep apnea, work on weight loss prior to EP referral.  Could consider rate control as she was overall asymptomatic.  ZIO monitor resulted with predominant atrial fibrillation with average heart rate of 70 bpm.  04/11/2023 she underwent diagnostic cystoscopy, l laser lithotripsy and bilateral ureteral stent placement.  Repeat preprocedure 04/06/2023 with right ureteral stent exchange and left stent removal.  She presents today for follow-up.  Family history notable for atrial fibrillation in both of her parents. Since last urologic procedure has had no recurrent hematuria. She got her CPAP 04/12/23 right before her urologic procedures. Had to return CPAP as it was turning off by itself. She was able to get a new machine. Presently having issues with some dry mouth with CPAP. Has gotten up to 4 hours per  night and is working to increase. Biotene dry mouth rinse is coming in today. She is interested to see if CPAP restores NSR. Overall asymptomatic from atrial fibrillation. BP at home 130-135/80s. Workign on weight loss with diet and doing chair yoga at home.  Reports no shortness of breath nor dyspnea on exertion. Reports no chest pain, pressure, or tightness. No edema, orthopnea, PND. Reports no palpitations.    ROS: Please see the history of present illness.    All other systems reviewed and are negative.   Studies Reviewed: Marland Kitchen    EKG:  EKG is not ordered today.    Risk Assessment/Calculations:    CHA2DS2-VASc Score = 2   This indicates a 2.2% annual risk of stroke. The patient's score is based upon: CHF History: 0 HTN History: 1 Diabetes History: 0 Stroke History: 0 Vascular Disease History: 0 Age Score: 0 Gender Score: 1         STOP-Bang Score:  4      Physical Exam:   VS:  BP 132/86   Pulse 79   Ht 5\' 9"  (1.753 m)   Wt (!) 398 lb 12.8 oz (180.9 kg)   SpO2 97%   BMI 58.89 kg/m    Wt Readings from Last 3 Encounters:  05/12/23 (!) 398 lb 12.8 oz (180.9 kg)  04/25/23 (!) 395 lb (179.2 kg)  04/11/23 (!) 403 lb (182.8 kg)    GEN: Well nourished, overweight, well developed in no acute distress NECK: No JVD; No carotid bruits CARDIAC: IRIR, no murmurs, rubs, gallops RESPIRATORY:  Clear to auscultation without rales, wheezing or rhonchi  ABDOMEN: Soft, non-tender, non-distended EXTREMITIES:  No edema; No deformity   ASSESSMENT AND PLAN: .   Atrial fibrillation/hypercoagulable state - status post DCCV 01/25/23 at which time maintained NSR <2 weeks. Atrial fib in both her mom and dad. Monitor 02/2023 with 100% atrial fibrillation burden with rate controlled.  She is overall asymptomatic with no palpitations.  Continue rate control at this time. CHA2DS2-VASc Score = 2 [CHF History: 0, HTN History: 1, Diabetes History: 0, Stroke History: 0, Vascular Disease History: 0, Age  Score: 0, Gender Score: 1].  Therefore, the patient's annual risk of stroke is 2.2 %. Continue Eliquis 5 mg twice daily.  Does not meet dose reduction criteria.  OSA-CPAP compliance encouraged. She is wearing CPAP 4 hours per night and working to gradually increase.   Obesity - Weight loss via diet and exercise encouraged. Discussed the impact being overweight would have on cardiovascular risk. Congratulated on continued weight loss.  Offered referral to prep exercise program which she will consider.  HTN- BP reasonably well controlled. Continue current antihypertensive regimen. Discussed to monitor BP at home at least 2 hours after medications and sitting for 5-10 minutes.  If BP persistently greater than 130/80 consider addition of amlodipine versus ARB.  Aortic stenosis-mild by echo 11/2022.  We plan to repeat echo in 1 year, ordered today continue optimal blood pressure control.       Dispo: As scheduled with Dr. Duke Salvia in September   Signed, Alver Sorrow, NP

## 2023-05-18 DIAGNOSIS — M170 Bilateral primary osteoarthritis of knee: Secondary | ICD-10-CM | POA: Diagnosis not present

## 2023-06-07 DIAGNOSIS — G4733 Obstructive sleep apnea (adult) (pediatric): Secondary | ICD-10-CM | POA: Diagnosis not present

## 2023-06-09 ENCOUNTER — Other Ambulatory Visit: Payer: Self-pay | Admitting: Cardiovascular Disease

## 2023-06-09 NOTE — Telephone Encounter (Signed)
Rx request sent to pharmacy.  

## 2023-06-13 IMAGING — MG MM DIGITAL DIAGNOSTIC UNILAT*R* W/ TOMO W/ CAD
8 of 15 series · 8 of 40 positions shown · non-contrast
Comparison: Previous exam(s).

CLINICAL DATA: Patient recalled from screening for right breast
asymmetries. Additionally, recall for reassessment of previously
biopsied left breast mass which demonstrated hemangioma on
pathology.

EXAM:
DIGITAL DIAGNOSTIC UNILATERAL RIGHT MAMMOGRAM WITH TOMOSYNTHESIS AND
CAD; ULTRASOUND LEFT BREAST LIMITED; ULTRASOUND RIGHT BREAST LIMITED
TECHNIQUE: Right digital diagnostic mammography and breast tomosynthesis was
performed. The images were evaluated with computer-aided detection.;
Targeted ultrasound examination of the left breast was performed.;
Targeted ultrasound examination of the right breast was performed

[R MLO synth-2D (1 of 4)]
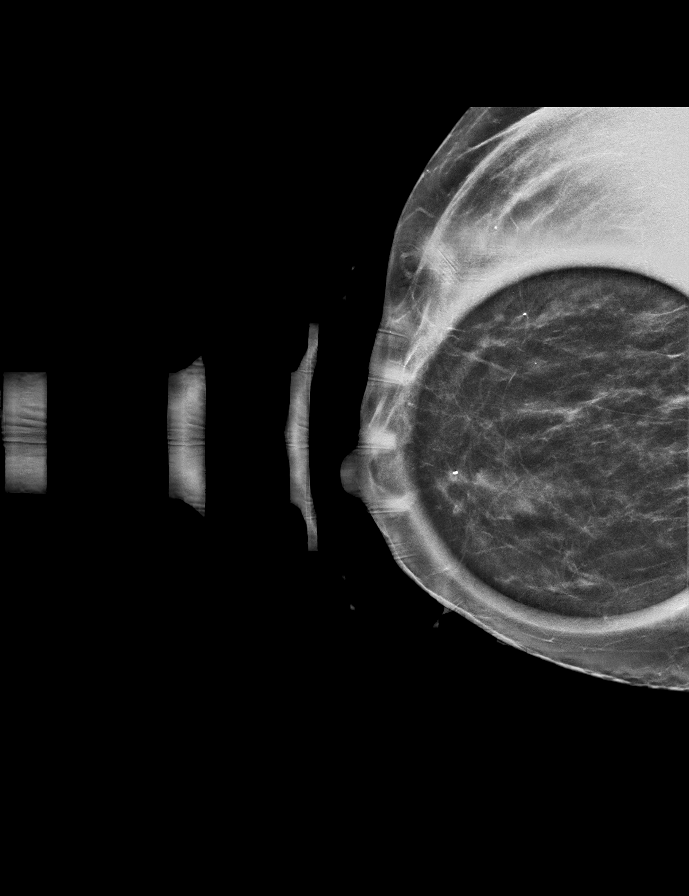

[R CC synth-2D (1 of 2)]
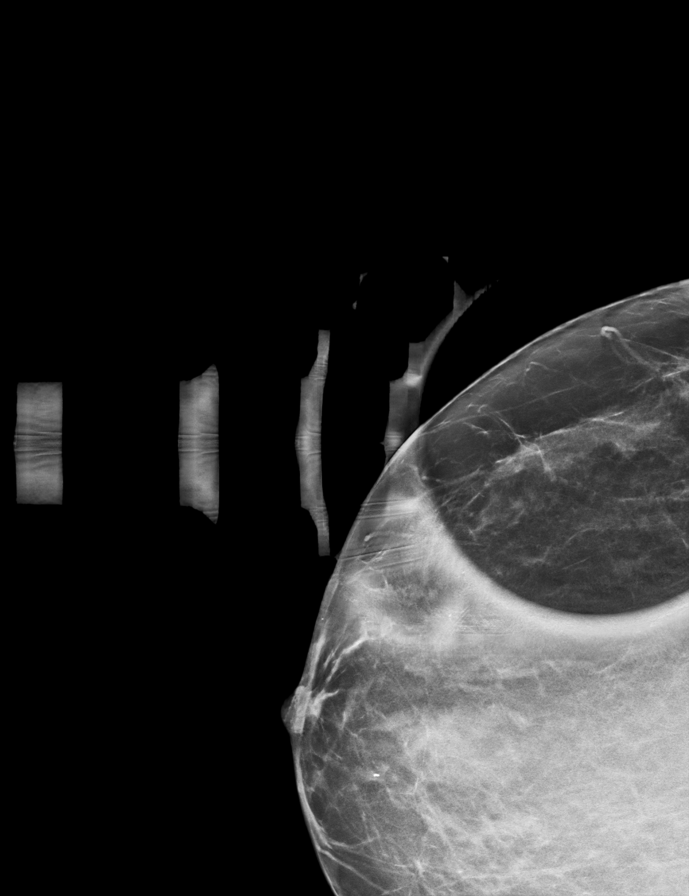

[R MLO synth-2D (2 of 4)]
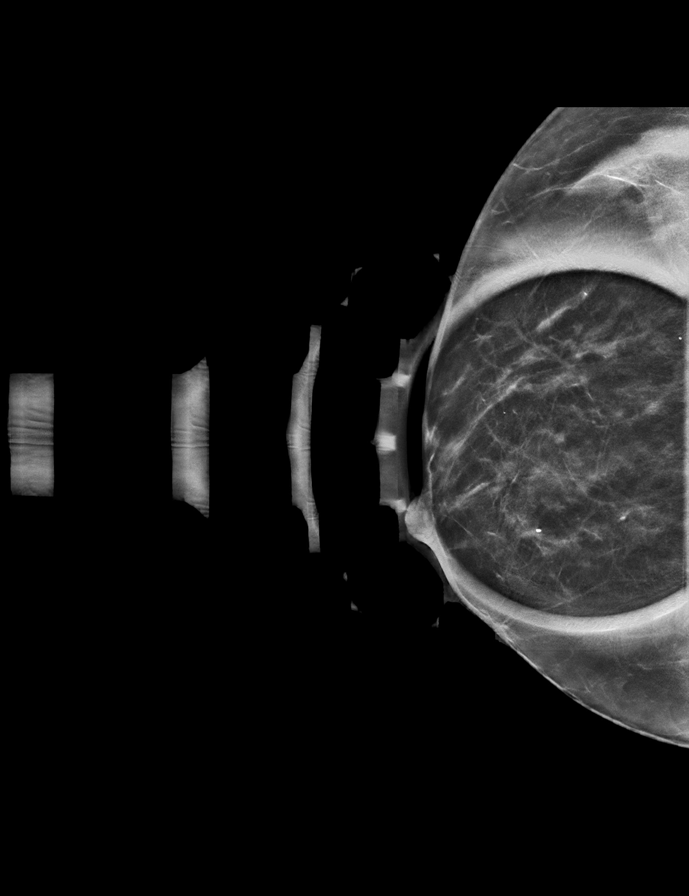

[R MLO synth-2D (3 of 4)]
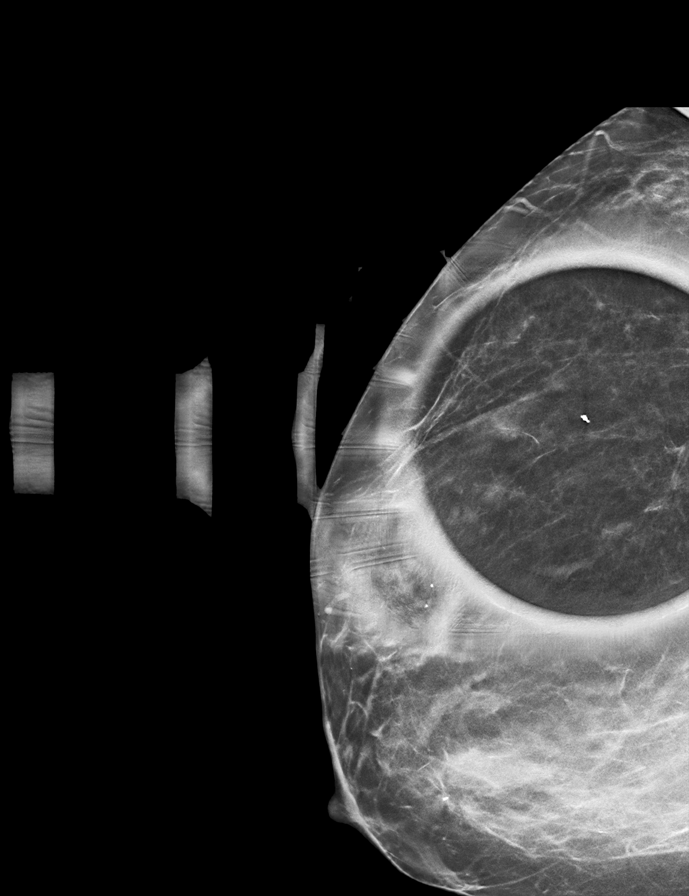

[R MLO synth-2D (4 of 4)]
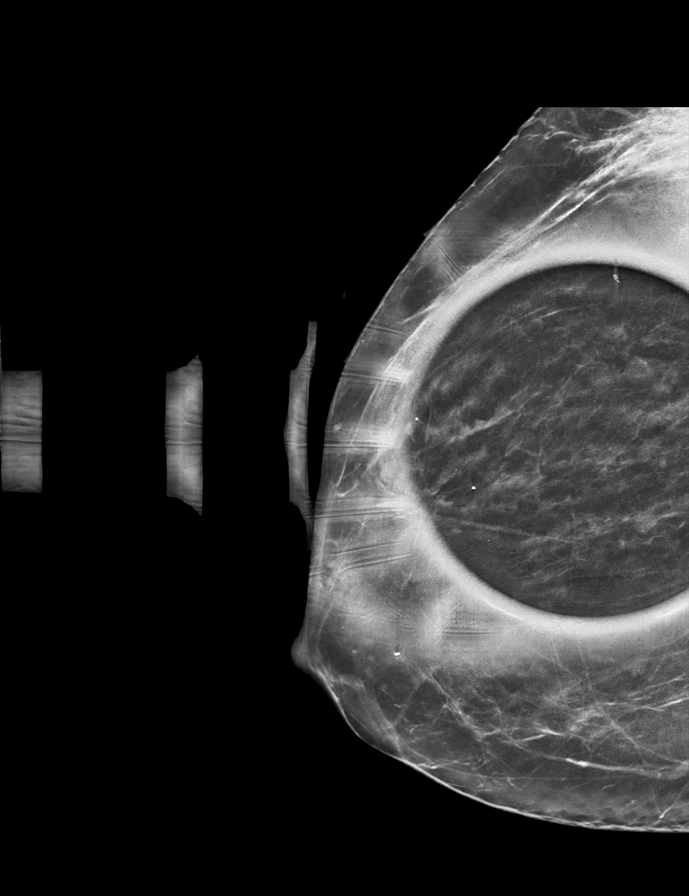

[R ML synth-2D]
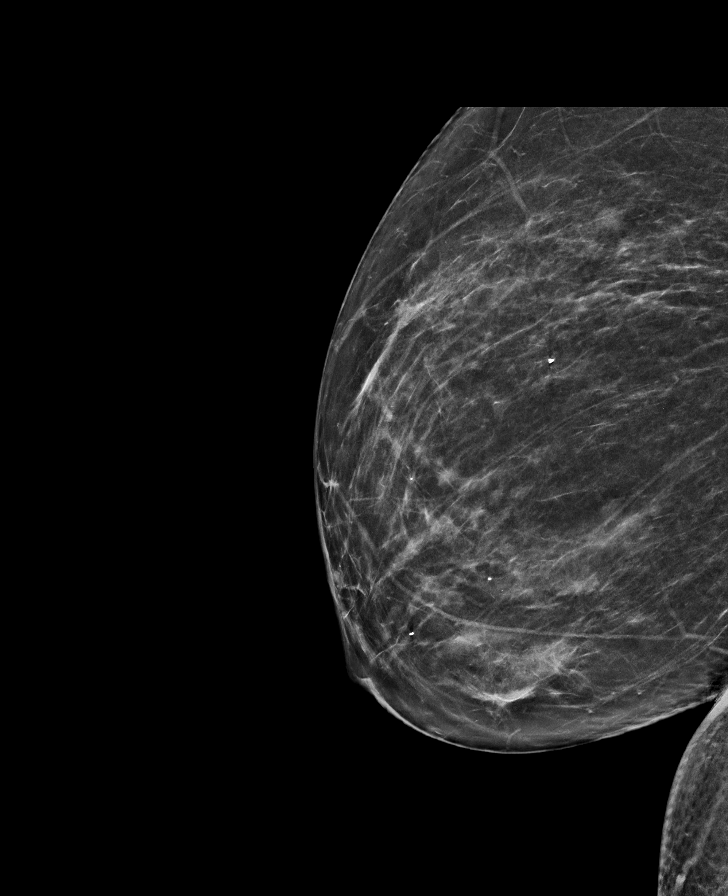

[R CC synth-2D (2 of 2)]
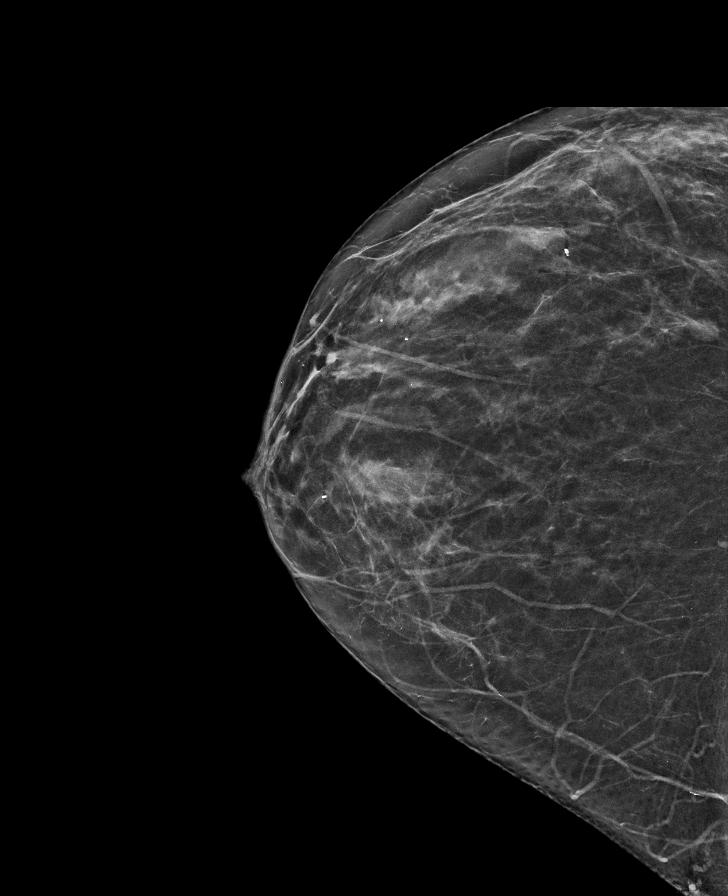

[R ML tomo · tomo slice 49/72.0]
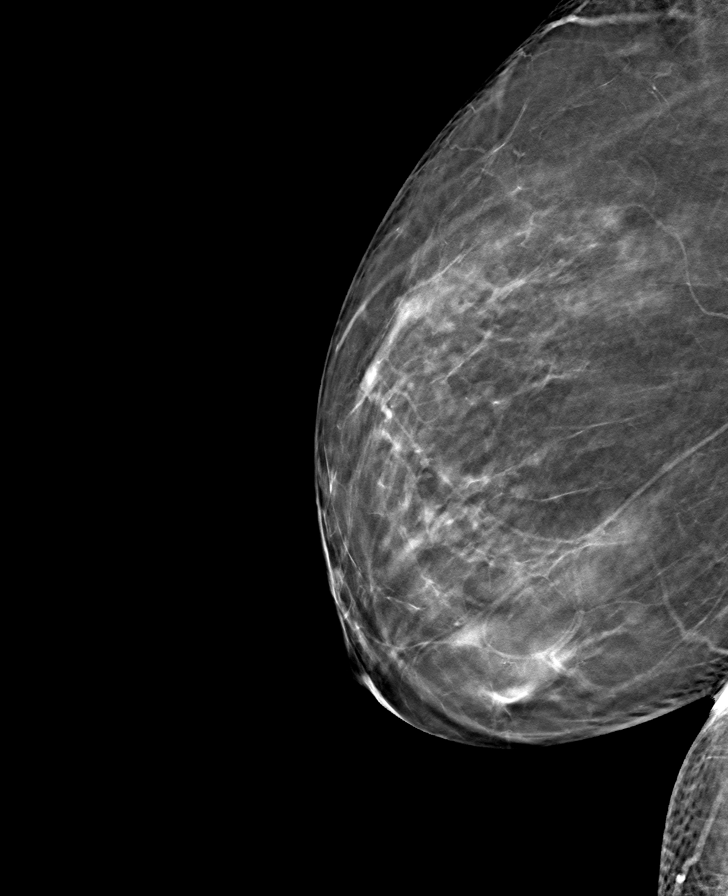

[8 of 40 positions shown; findings below may reference images not displayed]

ACR Breast Density Category b: There are scattered areas of
fibroglandular density.
FINDINGS: Within the inferior right breast and lateral right breast there are
asymmetries which predominately effaced with additional imaging
suggestive of dense fibroglandular tissue.

On physical exam, no discrete mass is palpated within the inferior
and lateral right breast.

Targeted ultrasound is performed, showing dense tissue within the
right breast 6 o'clock position 2 cm from nipple. Additionally dense
tissue within the right breast 9 o'clock position 3 cm from nipple
is visualized. No suspicious mass.

Within the left breast there is a 5 x 4 x 6 mm oval circumscribed
hypoechoic mass, previously measuring 5 x 6 x 6 mm. This mass
contains a biopsy marking clip.
IMPRESSION: Probably benign asymmetries within the right breast, favored to
represent dense fibroglandular tissue.

Previously biopsy left breast mass demonstrating hemangioma.

RECOMMENDATION:
Right breast diagnostic mammogram and possible ultrasound in 6
months to reassess the probably benign asymmetries.

Left breast ultrasound in 6 months to reassess the left breast mass
2 o'clock position 8 cm from nipple.

I have discussed the findings and recommendations with the patient.
If applicable, a reminder letter will be sent to the patient
regarding the next appointment.

BI-RADS CATEGORY  3: Probably benign.

## 2023-06-13 IMAGING — US US BREAST*R* LIMITED INC AXILLA
1 series · 3 of 3 positions shown · non-contrast
Comparison: Previous exam(s).

CLINICAL DATA: Patient recalled from screening for right breast
asymmetries. Additionally, recall for reassessment of previously
biopsied left breast mass which demonstrated hemangioma on
pathology.

EXAM:
DIGITAL DIAGNOSTIC UNILATERAL RIGHT MAMMOGRAM WITH TOMOSYNTHESIS AND
CAD; ULTRASOUND LEFT BREAST LIMITED; ULTRASOUND RIGHT BREAST LIMITED
TECHNIQUE: Right digital diagnostic mammography and breast tomosynthesis was
performed. The images were evaluated with computer-aided detection.;
Targeted ultrasound examination of the left breast was performed.;
Targeted ultrasound examination of the right breast was performed

[Series 1: us breast*right* limited inc axilla · 0.07mm/px · 3 of 3 slices shown]
[im 1/3]
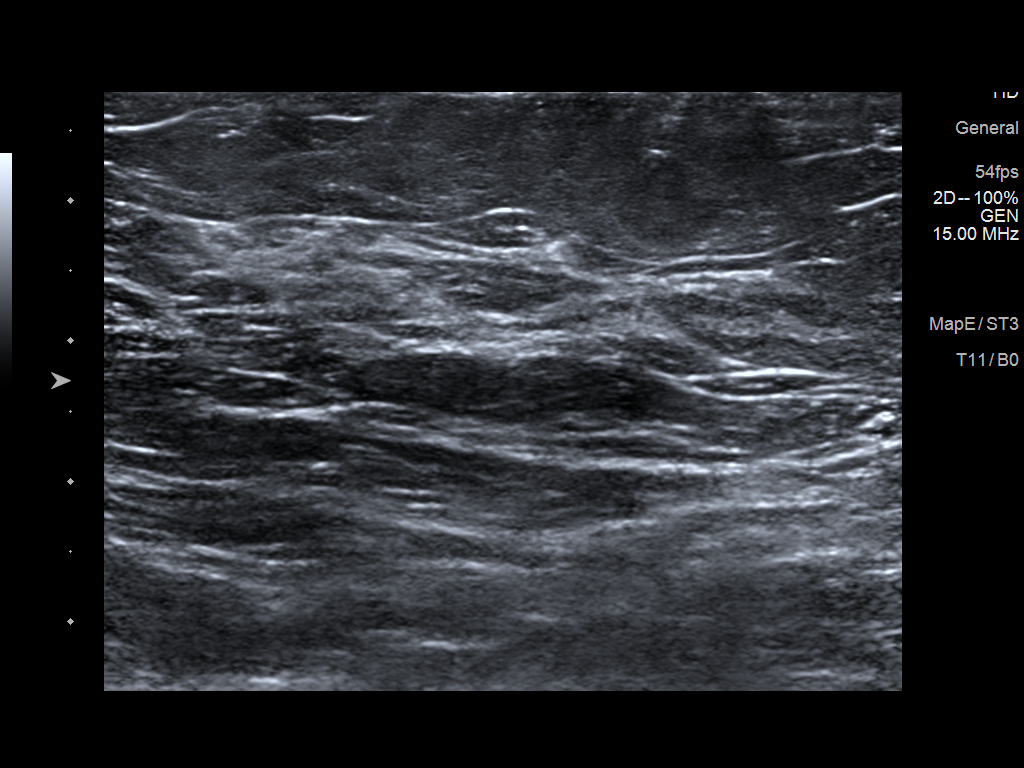
[im 2/3]
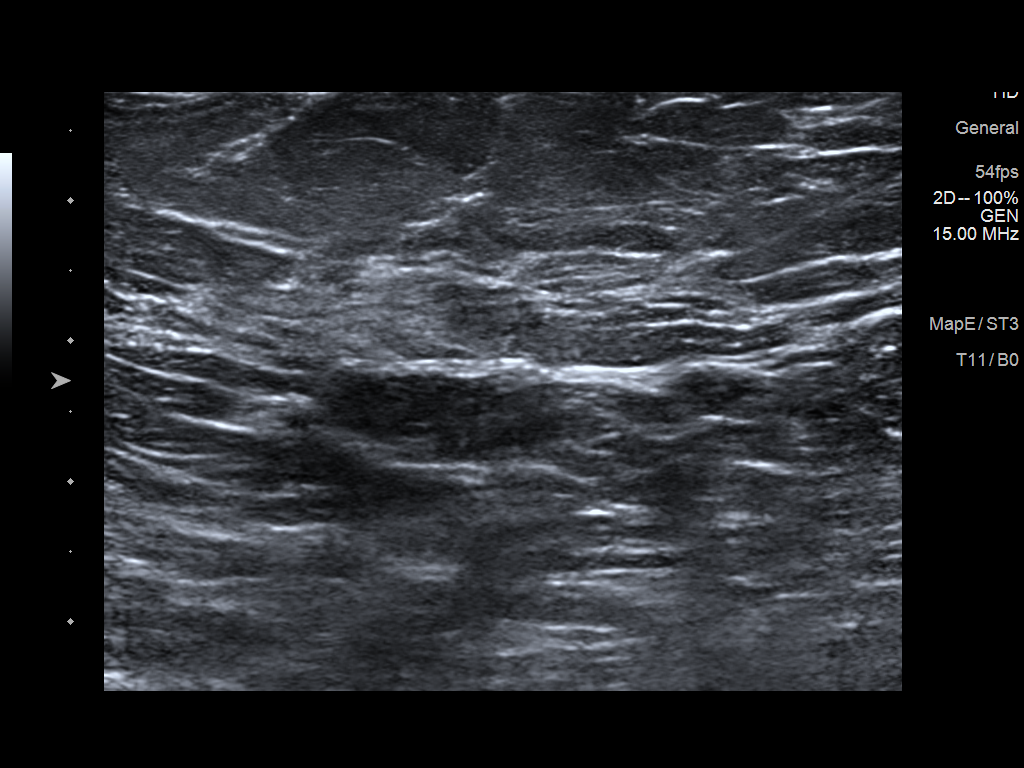
[im 3/3]
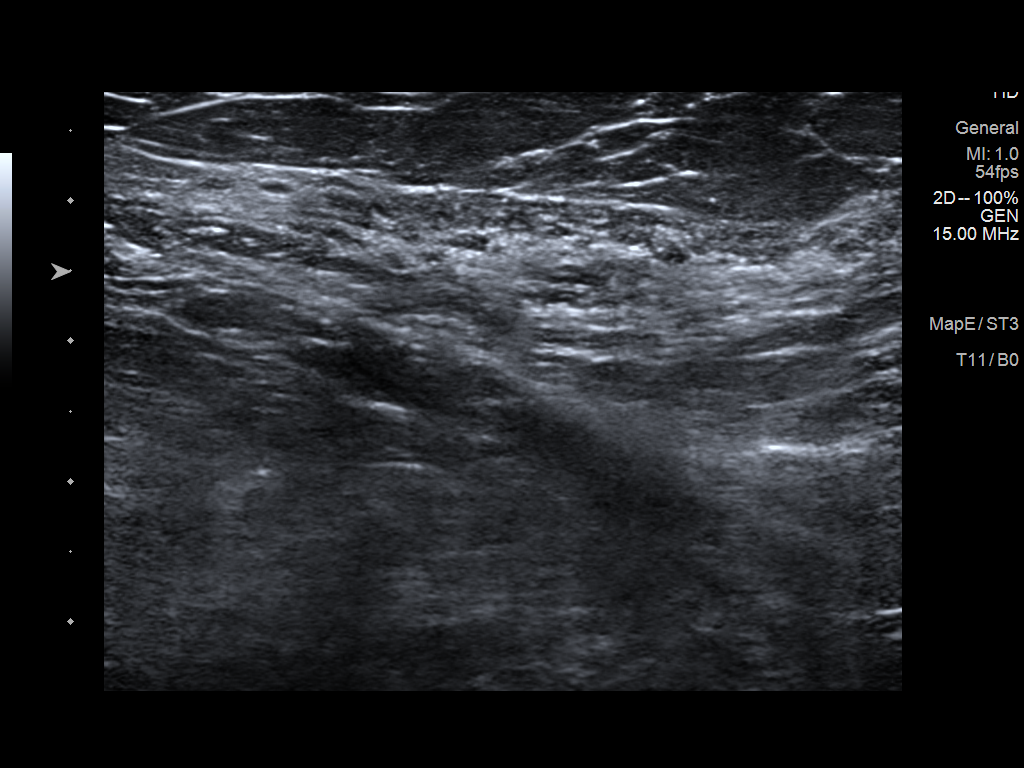

[3 of 3 positions shown; findings below may reference images not displayed]

ACR Breast Density Category b: There are scattered areas of
fibroglandular density.
FINDINGS: Within the inferior right breast and lateral right breast there are
asymmetries which predominately effaced with additional imaging
suggestive of dense fibroglandular tissue.

On physical exam, no discrete mass is palpated within the inferior
and lateral right breast.

Targeted ultrasound is performed, showing dense tissue within the
right breast 6 o'clock position 2 cm from nipple. Additionally dense
tissue within the right breast 9 o'clock position 3 cm from nipple
is visualized. No suspicious mass.

Within the left breast there is a 5 x 4 x 6 mm oval circumscribed
hypoechoic mass, previously measuring 5 x 6 x 6 mm. This mass
contains a biopsy marking clip.
IMPRESSION: Probably benign asymmetries within the right breast, favored to
represent dense fibroglandular tissue.

Previously biopsy left breast mass demonstrating hemangioma.

RECOMMENDATION:
Right breast diagnostic mammogram and possible ultrasound in 6
months to reassess the probably benign asymmetries.

Left breast ultrasound in 6 months to reassess the left breast mass
2 o'clock position 8 cm from nipple.

I have discussed the findings and recommendations with the patient.
If applicable, a reminder letter will be sent to the patient
regarding the next appointment.

BI-RADS CATEGORY  3: Probably benign.

## 2023-06-16 ENCOUNTER — Ambulatory Visit: Payer: Federal, State, Local not specified - PPO | Admitting: Cardiology

## 2023-06-16 VITALS — BP 134/82 | HR 84 | Ht 69.0 in | Wt 399.6 lb

## 2023-06-16 DIAGNOSIS — G4733 Obstructive sleep apnea (adult) (pediatric): Secondary | ICD-10-CM | POA: Diagnosis not present

## 2023-06-16 DIAGNOSIS — I10 Essential (primary) hypertension: Secondary | ICD-10-CM

## 2023-06-16 NOTE — Progress Notes (Signed)
Sleep Medicine CONSULT Note    Date:  06/16/2023   ID:  [name], DOB Apr 24, 1966, MRN [id number]  PCP:  Jarrett Soho, PA-C  Cardiologist: Chilton Si, MD   Chief Complaint  Patient presents with   New Patient (Initial Visit)    OSA    History of Present Illness:  [name] is a 57 y.o. female who is being seen today for the evaluation of OSA at the request of Chilton Si, MD.  This is a 57yo female with a hx of PAF, HTN, morbid obesity, AS who is followup by Dr. Duke Salvia. She was seen by Wallis Bamberg, NP back in January 2024 and complained of excessive daytime sleepiness and snoring with a Stop Bang score of 4.  She underwent HST which showed severe OSA with an AHI fo 32.7/hr with no central events.  She underwent CPAP titration to 13cm H2O.  She is now referred for sleep medicine consultation to establish sleep care.    She is doing well with her PAP device and thinks that she has gotten used to it.  She tolerates the mask and feels the pressure is adequate.  She did have some issues with mask leakage at first but just got a new FFM that seemed to work last night. Since going on PAP she feels rested in the am and has no significant daytime sleepiness. She has had some issues with dry mouth and has been taking some lozenges for it which help. She does not think that she snores.    Past Medical History:  Diagnosis Date   Anemia    Anxiety    Aortic stenosis 11/04/2021   Arthritis    knees    Atrial fibrillation (HCC)    Headache    hx of migraines    History of hiatal hernia    History of kidney stones    Hypertension    LVH (left ventricular hypertrophy) due to hypertensive disease 11/2022   Morbid obesity with BMI of 60.0-69.9, adult (HCC)    BMI 62   Murmur 08/15/2017   Pneumonia 07/18/2011   hx of pneumonia after gastric bypass in 2015   Preoperative evaluation of a medical condition to rule out surgical contraindications (TAR  required) 11/04/2021   Sleep apnea    cpap    Past Surgical History:  Procedure Laterality Date   APPENDECTOMY     BREAST BIOPSY Left 09/14/2020   BREAST BIOPSY Right 10/14/2022   [place] RT BREAST BX W LOC DEV 1ST LESION IMG BX SPEC US GUIDE 10/14/2022 [facility]   CARDIOVERSION N/A 01/25/2023   Procedure: CARDIOVERSION;  Surgeon: Jake Bathe, MD;  Location: [facility];  Service: Cardiovascular;  Laterality: N/A;   CARPAL TUNNEL RELEASE Right 11/23/2021   Procedure: CARPAL TUNNEL RELEASE;  Surgeon: Frederico Hamman, MD;  Location: [facility];  Service: Orthopedics;  Laterality: Right;   CESAREAN SECTION     CHOLECYSTECTOMY     CYSTOSCOPY/URETEROSCOPY/HOLMIUM LASER/STENT PLACEMENT Bilateral 04/11/2023   Procedure: DIAGNOSTIC CYSTOSCOPY; LEFT URETEROSCOPY AND HOLMIUM LASER LITHOTRIPSY; BILATERAL URETERAL STENT PLACEMENT;  Surgeon: Noel Christmas, MD;  Location: [facility];  Service: Urology;  Laterality: Bilateral;  90 MINUTES   CYSTOSCOPY/URETEROSCOPY/HOLMIUM LASER/STENT PLACEMENT Bilateral 04/25/2023   Procedure: DIAGNOSTIC CYSTOSCOPY, RIGHT URETEROSCOPY, HOLMIUM LASER LITHOTRIPSY, AND RIGHT URETERAL STENT EXCHANGE, LEFT STENT REMOVAL;  Surgeon: Noel Christmas, MD;  Location: [facility];  Service: Urology;  Laterality: Bilateral;  90 MINUTES   LAPAROSCOPIC ROUX-EN-Y GASTRIC BYPASS  WITH HIATAL HERNIA REPAIR N/A 12/30/2014   Procedure: LAPAROSCOPIC ROUX-EN-Y GASTRIC BYPASS WITH HIATAL HERNIA REPAIR AND UPPER ENDOSCOPY;  Surgeon: [name], MD;  Location: [facility];  Service: General;  Laterality: N/A;   TONSILLECTOMY      Current Medications: No outpatient medications have been marked as taking for the 06/16/23 encounter (Office Visit) with Quintella Reichert, MD.    Allergies:   Darvocet [propoxyphene n-acetaminophen] and Penicillins   Social History   Socioeconomic History   Marital status: Married    Spouse name: Not on file   Number of children: Not on file   Years of education:  Not on file   Highest education level: Not on file  Occupational History   Not on file  Tobacco Use   Smoking status: Never   Smokeless tobacco: Never  Vaping Use   Vaping status: Never Used  Substance and Sexual Activity   Alcohol use: No   Drug use: No   Sexual activity: Yes    Birth control/protection: Pill    Comment: azurette  Other Topics Concern   Not on file  Social History Narrative   Not on file   Social Determinants of Health   Financial Resource Strain: Not on file  Food Insecurity: Not on file  Transportation Needs: Not on file  Physical Activity: Not on file  Stress: Not on file  Social Connections: Not on file     Family History:  The patient's family history includes Arrhythmia in her father and mother; Breast cancer in her maternal grandmother; Cancer in her father and paternal grandmother; Colon cancer in her father; Diabetes in her father, paternal aunt, and paternal uncle; Heart attack in her maternal grandfather; Heart disease in her maternal grandfather; Hypertension in her brother, father, and mother; Mental illness in her paternal aunt; Stroke in her paternal aunt.   ROS:   Please see the history of present illness.    ROS All other systems reviewed and are negative.     08/15/2017    8:47 AM  PAD Screen  Previous PAD dx? No  Previous surgical procedure? No  Pain with walking? No  Feet/toe relief with dangling? No  Painful, non-healing ulcers? No  Extremities discolored? No       PHYSICAL EXAM:   VS:  There were no vitals taken for this visit.   GEN: Well nourished, well developed, in no acute distress  HEENT: normal  Neck: no JVD, carotid bruits, or masses Cardiac: RRR; no murmurs, rubs, or gallops,no edema.  Intact distal pulses bilaterally.  Respiratory:  clear to auscultation bilaterally, normal work of breathing GI: soft, nontender, nondistended, + BS MS: no deformity or atrophy  Skin: warm and dry, no rash Neuro:  Alert and  Oriented x 3, Strength and sensation are intact Psych: euthymic mood, full affect  Wt Readings from Last 3 Encounters:  05/12/23 (!) 398 lb 12.8 oz (180.9 kg)  04/25/23 (!) 395 lb (179.2 kg)  04/11/23 (!) 403 lb (182.8 kg)      Studies/Labs Reviewed:   HST, CPAP titration, PAP compliance download  Recent Labs: 12/23/2022: Magnesium 2.3; TSH 1.430 04/25/2023: BUN 17; Creatinine, Ser 0.92; Hemoglobin 13.8; Platelets 311; Potassium 3.4; Sodium 140    CHA2DS2-VASc Score = 2   This indicates a 2.2% annual risk of stroke. The patient's score is based upon: CHF History: 0 HTN History: 1 Diabetes History: 0 Stroke History: 0 Vascular Disease History: 0 Age Score: 0 Gender Score: 1  ASSESSMENT:    1. OSA (obstructive sleep apnea)   2. Essential hypertension      PLAN:  In order of problems listed above:  OSA - The patient is tolerating PAP therapy well without any problems. The PAP download performed by his DME was personally reviewed and interpreted by me today and showed an AHI of 2.5/hr on 13 cm H2O with 77% compliance in using more than 4 hours nightly.  The patient has been using and benefiting from PAP use and will continue to benefit from therapy.   2.  HTN -BP controlled on exam today -continue prescription drug management with Lopressor 50mg  BID, Maxzide 37.5-25mg  daily with PRN refills  Followup with me in 1 year  Time Spent: 20 minutes total time of encounter, including 15 minutes spent in face-to-face patient care on the date of this encounter. This time includes coordination of care and counseling regarding above mentioned problem list. Remainder of non-face-to-face time involved reviewing chart documents/testing relevant to the patient encounter and documentation in the medical record. I have independently reviewed documentation from referring provider  Medication Adjustments/Labs and Tests Ordered: Current medicines are reviewed at length with the patient  today.  Concerns regarding medicines are outlined above.  Medication changes, Labs and Tests ordered today are listed in the Patient Instructions below.  There are no Patient Instructions on file for this visit.   Signed, Armanda Magic, MD  06/16/2023 1:26 PM    [facility] [address] [place], [place], [place]  [address] Phone: [phone]; Fax: [phone]

## 2023-06-16 NOTE — Patient Instructions (Signed)
Medication Instructions:  Your physician recommends that you continue on your current medications as directed. Please refer to the Current Medication list given to you today.  *If you need a refill on your cardiac medications before your next appointment, please call your pharmacy*   Lab Work: None.  If you have labs (blood work) drawn today and your tests are completely normal, you will receive your results only by: MyChart Message (if you have MyChart) OR A paper copy in the mail If you have any lab test that is abnormal or we need to change your treatment, we will call you to review the results.   Testing/Procedures: None.   Follow-Up: At  [facility], you and your health needs are our priority.  As part of our continuing mission to provide you with exceptional heart care, we have created designated Provider Care Teams.  These Care Teams include your primary Cardiologist (physician) and Advanced Practice Providers (APPs -  Physician Assistants and Nurse Practitioners) who all work together to provide you with the care you need, when you need it.  We recommend signing up for the patient portal called "MyChart".  Sign up information is provided on this After Visit Summary.  MyChart is used to connect with patients for Virtual Visits (Telemedicine).  Patients are able to view lab/test results, encounter notes, upcoming appointments, etc.  Non-urgent messages can be sent to your provider as well.   To learn more about what you can do with MyChart, go to [email or web address].    Your next appointment:   1 year(s)  Provider:   Dr. Traci Turner, MD   

## 2023-07-06 ENCOUNTER — Telehealth (INDEPENDENT_AMBULATORY_CARE_PROVIDER_SITE_OTHER): Payer: Federal, State, Local not specified - PPO | Admitting: Psychiatry

## 2023-07-06 ENCOUNTER — Encounter: Payer: Self-pay | Admitting: Psychiatry

## 2023-07-06 DIAGNOSIS — G4733 Obstructive sleep apnea (adult) (pediatric): Secondary | ICD-10-CM | POA: Diagnosis not present

## 2023-07-06 DIAGNOSIS — F338 Other recurrent depressive disorders: Secondary | ICD-10-CM | POA: Diagnosis not present

## 2023-07-06 DIAGNOSIS — F422 Mixed obsessional thoughts and acts: Secondary | ICD-10-CM | POA: Diagnosis not present

## 2023-07-06 NOTE — Progress Notes (Signed)
[name] [id number] 04/30/1966 57 y.o.  Video Visit via My Chart  I connected with pt by My Chart and verified that I am speaking with the correct person using two identifiers.   I discussed the limitations, risks, security and privacy concerns of performing an evaluation and management service by My Chart  and the availability of in person appointments. I also discussed with the patient that there may be a patient responsible charge related to this service. The patient expressed understanding and agreed to proceed.  I discussed the assessment and treatment plan with the patient. The patient was provided an opportunity to ask questions and all were answered. The patient agreed with the plan and demonstrated an understanding of the instructions.   The patient was advised to call back or seek an in-person evaluation if the symptoms worsen or if the condition fails to improve as anticipated.  I provided 25 minutes of video time during this encounter.  The patient was located at home and the provider was located office. Session from 1035 until 11:00 AM.  Subjective:   Patient ID:  [name] is a 57 y.o. (DOB March 20, 1966) female.  Chief Complaint:  Chief Complaint  Patient presents with   Follow-up   Depression   Anxiety  FU anxiety and mood.   HPI  [name] presents to the office today for follow-up of depression with seasonality and anxiety.     seen July 2020.  No meds were changed.  04/10/20 appt.  The following noted: Teaches online.  Difficult during Covid.  Vaccinated which helped stress over the virus.  Able to get out more.  Less anxiety.   Difficult to stay home all these months.  Good vacation helped.  Mood generally OK but Covid hard and stress of school uncertainties.  Before vacation hard to get OOB but better since then.  Not really obsessing.  Works from home.  That helps keep her on track and schedule. No worsening OCD but anxiety with Covid is  some better.  Last summer hard to get OOB but OK now. Ok this winter so far.  Vitamin D helped. Likes benefit lorazepam.  Only used rarely.  Otherwise usually ok. Plan: Continue fluoxetine 60 mg daily  12/22/2021 appt noted: Use lorazepam just as needed for anxiety and not often. Remains on fluoxetine 60 mg daily.  No SE Good not much obsessing unless really tired.   Stressful season bc H working a lot so everything falls to her.  Coping well. Not much seasonal depression.  Taking vitamin D in fall winter. D [place] grad HS this year and going to [employer] Plan: no med changes  06/23/22 appt noted: Consistent with fluoxetine 60. No SE Use lorazepam prn when visits family to keep from panic.  Not panic lately.  Anxiety is good. 3 weeks away H retiring and D going off to college.  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 Minimal obsessing unless really tired.  Under control.   Patient reports stable mood and denies depressed or irritable moods.  Patient denies any recent difficulty with anxiety.  Patient denies difficulty with sleep initiation or maintenance. Denies appetite disturbance.  Patient reports that energy and motivation have been good.  Patient denies any difficulty with concentration.  Patient denies any suicidal ideation. Health stable.  Works from home. Plan: No change indicated.  Continue fluoxetine 60 mg daily Rare lorzepam prn  12/29/22 appt noted: Good overall.  Needed more lorazepam  bc need more at [facility].  But not this month as much.  When with family needed more. 2 weeks ago dx afib and changed meds to metoprolol. Afib did not cause sx.   Anxiety ok now.  OCD is controlled.  No sig dep.  Sleep is good and no problems with meds. [place] adjusted to school at college.   Plan: no changes  07/06/23 appt noted: Meds as above rare lorazepam like with medical procedures and it helped. Dx afib on Eloquis.   But also severe OSA started CPAP May  nightly 5-6 hours+. AHI 32 and treated.  Feels more alert during the day.  Dream more.  Restorative sleep and better energy. Mood and anxiety good.  OCD managed.  Minimal unless overly tired.  Mentally fine.   No SE with meds.   Still working.  H retired from [employer] last year.  Will work another couple of years. D will start 2nd year college this year.  Past Psychiatric Medication Trials:' no others. Fluoxetine 60 lorazepam Under the care of [facility] for many many years.  Review of Systems:  Review of Systems  Constitutional:  Negative for fatigue.  Respiratory:  Negative for shortness of breath.   Cardiovascular:  Negative for palpitations.  Musculoskeletal:  Positive for arthralgias, back pain and gait problem.  Neurological:  Negative for tremors.  Psychiatric/Behavioral:  Negative for agitation, behavioral problems, confusion, decreased concentration, dysphoric mood, hallucinations, self-injury, sleep disturbance and suicidal ideas. The patient is not nervous/anxious and is not hyperactive.     Medications: I have reviewed the patient's current medications.  Current Outpatient Medications  Medication Sig Dispense Refill   apixaban (ELIQUIS) 5 MG TABS tablet Take 1 tablet (5 mg total) by mouth 2 (two) times daily. 180 tablet 3   Biotin 5 MG CAPS Take 5 mg by mouth daily.     Cyanocobalamin (VITAMIN B-12) 5000 MCG TBDP Take 5,000 mcg by mouth in the morning.     diphenhydramine-acetaminophen (TYLENOL PM) 25-500 MG TABS Take 2 tablets by mouth at bedtime.     ferrous sulfate 325 (65 FE) MG tablet Take 325 mg by mouth daily with breakfast.     FLUoxetine (PROZAC) 20 MG capsule Take 3 capsules (60 mg total) by mouth daily. 270 capsule 3   LORazepam (ATIVAN) 0.5 MG tablet TAKE 1 TABLET(0.5 MG) BY MOUTH EVERY 8 HOURS AS NEEDED FOR ANXIETY 30 tablet 2   metoprolol tartrate (LOPRESSOR) 50 MG tablet Take 1 tablet (50 mg total) by mouth 2 (two) times daily. 180 tablet  3   Multiple Vitamin (MULTIVITAMIN WITH MINERALS) TABS tablet Take 1 tablet by mouth in the morning and at bedtime.     NORLYDA 0.35 MG tablet Take 0.35 mg by mouth every evening.     triamterene-hydrochlorothiazide (MAXZIDE-25) 37.5-25 MG tablet TAKE 1 TABLET BY MOUTH DAILY 90 tablet 1   No current facility-administered medications for this visit.    Medication Side Effects: None  Allergies:  Allergies  Allergen Reactions   Darvocet [Propoxyphene N-Acetaminophen] Rash   Penicillins Swelling    Eye swelling    Past Medical History:  Diagnosis Date   Anemia    Anxiety    Aortic stenosis 11/04/2021   Arthritis    knees    Atrial fibrillation (HCC)    Headache    hx of migraines    History of hiatal hernia    History of kidney stones    Hypertension    LVH (left  ventricular hypertrophy) due to hypertensive disease 11/2022   Morbid obesity with BMI of 60.0-69.9, adult (HCC)    BMI 62   Murmur 08/15/2017   Pneumonia 07/18/2011   hx of pneumonia after gastric bypass in 2015   Preoperative evaluation of a medical condition to rule out surgical contraindications (TAR required) 11/04/2021   Sleep apnea    cpap    Family History  Problem Relation Age of Onset   Arrhythmia Mother        atrial fibrillation   Hypertension Mother    Arrhythmia Father        atrial fibrillation   Hypertension Father    Cancer Father        prostate   Diabetes Father    Colon cancer Father    Hypertension Brother    Breast cancer Maternal Grandmother    Heart disease Maternal Grandfather    Heart attack Maternal Grandfather    Cancer Paternal Grandmother        liver   Diabetes Paternal Aunt    Stroke Paternal Aunt    Mental illness Paternal Aunt    Diabetes Paternal Uncle     Social History   Socioeconomic History   Marital status: Married    Spouse name: Not on file   Number of children: Not on file   Years of education: Not on file   Highest education level: Not on file   Occupational History   Not on file  Tobacco Use   Smoking status: Never   Smokeless tobacco: Never  Vaping Use   Vaping status: Never Used  Substance and Sexual Activity   Alcohol use: No   Drug use: No   Sexual activity: Yes    Birth control/protection: Pill    Comment: azurette  Other Topics Concern   Not on file  Social History Narrative   Not on file   Social Determinants of Health   Financial Resource Strain: Not on file  Food Insecurity: Not on file  Transportation Needs: Not on file  Physical Activity: Not on file  Stress: Not on file  Social Connections: Not on file  Intimate Partner Violence: Not on file    Past Medical History, Surgical history, Social history, and Family history were reviewed and updated as appropriate.   Please see review of systems for further details on the patient's review from today.   Objective:   Physical Exam:  There were no vitals taken for this visit.  Physical Exam Neurological:     Mental Status: She is alert and oriented to person, place, and time.     Cranial Nerves: No dysarthria.  Psychiatric:        Attention and Perception: Attention and perception normal.        Mood and Affect: Mood is not anxious or depressed.        Speech: Speech normal.        Behavior: Behavior is cooperative.        Thought Content: Thought content normal. Thought content is not paranoid or delusional. Thought content does not include homicidal or suicidal ideation. Thought content does not include suicidal plan.        Cognition and Memory: Cognition and memory normal.        Judgment: Judgment normal.     Comments: Insight intact Minimal obsessions     Lab Review:     Component Value Date/Time   NA 140 04/25/2023 0747   NA 140 01/16/2023 0951  K 3.4 (L) 04/25/2023 0747   CL 104 04/25/2023 0747   CO2 26 04/25/2023 0747   GLUCOSE 100 (H) 04/25/2023 0747   BUN 17 04/25/2023 0747   BUN 21 01/16/2023 0951   CREATININE 0.92  04/25/2023 0747   CREATININE 0.57 08/25/2014 0836   CALCIUM 8.7 (L) 04/25/2023 0747   PROT 6.3 (L) 11/17/2021 1053   PROT 6.5 12/11/2020 1122   ALBUMIN 3.6 11/17/2021 1053   ALBUMIN 4.2 12/11/2020 1122   AST 19 11/17/2021 1053   ALT 17 11/17/2021 1053   ALKPHOS 87 11/17/2021 1053   BILITOT 1.0 11/17/2021 1053   BILITOT 0.7 12/11/2020 1122   GFRNONAA >60 04/25/2023 0747   GFRAA 95 12/11/2020 1122       Component Value Date/Time   WBC 11.9 (H) 04/25/2023 0747   RBC 4.79 04/25/2023 0747   HGB 13.8 04/25/2023 0747   HGB 15.0 01/16/2023 0951   HGB 13.1 06/05/2014 0920   HCT 44.3 04/25/2023 0747   HCT 47.4 (H) 01/16/2023 0951   HCT 41.1 06/05/2014 0920   PLT 311 04/25/2023 0747   PLT 341 01/16/2023 0951   MCV 92.5 04/25/2023 0747   MCV 89 01/16/2023 0951   MCV 89.2 06/05/2014 0920   MCH 28.8 04/25/2023 0747   MCHC 31.2 04/25/2023 0747   RDW 14.9 04/25/2023 0747   RDW 13.3 01/16/2023 0951   RDW 14.3 06/05/2014 0920   LYMPHSABS 1.4 01/01/2015 0621   LYMPHSABS 1.9 06/05/2014 0920   MONOABS 1.6 (H) 01/01/2015 0621   MONOABS 0.9 06/05/2014 0920   EOSABS 0.1 01/01/2015 0621   EOSABS 0.2 06/05/2014 0920   BASOSABS 0.0 01/01/2015 0621   BASOSABS 0.1 06/05/2014 0920    No results found for: "POCLITH", "LITHIUM"   No results found for: "PHENYTOIN", "PHENOBARB", "VALPROATE", "CBMZ"   .res Assessment: Plan:    Amanat was seen today for follow-up, depression and anxiety.  Diagnoses and all orders for this visit:  Mixed obsessional thoughts and acts  Seasonal depression (HCC)  Severe obstructive sleep apnea   30 min video face to face time with patient was spent on counseling and coordination of care. We discussed OCD manageable.  Not much depression at present.  Tolerating meds.  Rare BZ. High relapse risk in OCD without SSRI .  She agrees to continue  Disc big transitions of only D to college and H retiring at same time but this has smoothed out and not a problem  now.  No change indicated.  Continue fluoxetine 60 mg daily Rare lorzepam prn  We discussed the short-term risks associated with benzodiazepines including sedation and increased fall risk among others.  Discussed long-term side effect risk including dependence, potential withdrawal symptoms, and the potential eventual dose-related risk of dementia.  But recent studies from 2020 dispute this association between benzodiazepines and dementia risk. Newer studies in 2020 do not support an association with dementia.  Extensive discussion of importance of CPAP for brain health and OSA noted.  She is compliant and benefiting.  No med changes  FU 6 mos  Meredith Staggers, MD, DFAPA   Please see After Visit Summary for patient specific instructions.  Future Appointments  Date Time Provider Department Center  08/25/2023  9:40 AM Chilton Si, MD [facility]  12/15/2023  1:00 PM [facility]    No orders of the defined types were placed in this encounter.     -------------------------------

## 2023-07-08 DIAGNOSIS — G4733 Obstructive sleep apnea (adult) (pediatric): Secondary | ICD-10-CM | POA: Diagnosis not present

## 2023-07-17 DIAGNOSIS — G4733 Obstructive sleep apnea (adult) (pediatric): Secondary | ICD-10-CM | POA: Diagnosis not present

## 2023-08-08 DIAGNOSIS — G4733 Obstructive sleep apnea (adult) (pediatric): Secondary | ICD-10-CM | POA: Diagnosis not present

## 2023-08-17 DIAGNOSIS — G4733 Obstructive sleep apnea (adult) (pediatric): Secondary | ICD-10-CM | POA: Diagnosis not present

## 2023-08-25 ENCOUNTER — Encounter (HOSPITAL_BASED_OUTPATIENT_CLINIC_OR_DEPARTMENT_OTHER): Payer: Self-pay | Admitting: Cardiovascular Disease

## 2023-08-25 ENCOUNTER — Ambulatory Visit (HOSPITAL_BASED_OUTPATIENT_CLINIC_OR_DEPARTMENT_OTHER): Payer: Federal, State, Local not specified - PPO | Admitting: Cardiovascular Disease

## 2023-08-25 VITALS — BP 100/80 | HR 92 | Ht 69.0 in | Wt >= 6400 oz

## 2023-08-25 DIAGNOSIS — I10 Essential (primary) hypertension: Secondary | ICD-10-CM

## 2023-08-25 DIAGNOSIS — Z6841 Body Mass Index (BMI) 40.0 and over, adult: Secondary | ICD-10-CM

## 2023-08-25 DIAGNOSIS — I350 Nonrheumatic aortic (valve) stenosis: Secondary | ICD-10-CM

## 2023-08-25 DIAGNOSIS — I4891 Unspecified atrial fibrillation: Secondary | ICD-10-CM

## 2023-08-25 NOTE — Patient Instructions (Signed)
  Medication Instructions:  Your physician recommends that you continue on your current medications as directed. Please refer to the Current Medication list given to you today.    *If you need a refill on your cardiac medications before your next appointment, please call your pharmacy*   Lab Work: NONE   Testing/Procedures: NONE   Follow-Up: At [facility], you and your health needs are our priority.  As part of our continuing mission to provide you with exceptional heart care, we have created designated Provider Care Teams.  These Care Teams include your primary Cardiologist (physician) and Advanced Practice Providers (APPs -  Physician Assistants and Nurse Practitioners) who all work together to provide you with the care you need, when you need it.   We recommend signing up for the patient portal called "MyChart".  Sign up information is provided on this After Visit Summary.  MyChart is used to connect with patients for Virtual Visits (Telemedicine).  Patients are able to view lab/test results, encounter notes, upcoming appointments, etc.  Non-urgent messages can be sent to your provider as well.   To learn more about what you can do with MyChart, go to [email or web address].     Your next appointment:   12 month(s)   Provider:   Tiffany Ashe, MD or Caitlin Walker, NP        

## 2023-08-25 NOTE — Progress Notes (Signed)
Cardiology Office Note:  .    Date:  09/11/2023  ID:  [name], DOB 05-29-1966, MRN [id number] PCP: Jarrett Soho, PA-C  [facility] Providers Cardiologist:  Chilton Si, MD     History of Present Illness: .    [name] is a 57 y.o. female with a hx of hypertension, PAF, OSA, and morbid obesity who presents for follow up.  She was initially seen 07/2017 for an evaluation of a murmur.  Ms. Grunder went for her yearly exam with [name] 06/2017 and was noted to have a heart murmur.  She has seen [name] for years and this was a new finding.  She had an echo 08/22/17 that revealed LVEF 65-70% with grade 1 diastolic dysfunction and mild aortic stenosis.  Metoprolol was switched to carvedilol due to elevated blood pressure.  HCTZ-triamterene was added and then increased by our pharmacist 09/2017.    Ms. Cardiel had an echo 11/2022 and was noted to be in atrial fibrillation.  Echo at that time revealed stable LVEF and mild aortic stenosis.  She was asymptomatic and rate controlled.  She was started on Eliquis.  She was found to have OSA on CPAP.  She wore a 14-day monitor 02/2023 that revealed predominantly atrial fibrillation that was well rate controlled.  She last saw Gillian Shields, NP 04/2023 and was struggling with her CPAP but otherwise doing well.  Today, she is feeling okay. In the office her blood pressure is 100/80. She did take her medications right before she came to the office. Typically she monitors her blood pressures after she wakes up prior to taking her medications, with average readings in the 130s/70s. She is compliant with her medications. She denies bruising easily, no bleeding issues on Eliquis. For exercise she completes chair yoga 2-3 times per week, and also goes on walks. With exercise she complains of some diaphoresis but no alarming exertional symptoms, no anginal symptoms. No alcohol consumption. She has stopped drinking tea due to her recent  kidney stones. She is now s/p lithotripsy and reports no further concerns with her kidneys at this time. She continues to try to get accustomed to her CPAP, follows with Dr. Mayford Knife. She denies any palpitations, chest pain, shortness of breath, peripheral edema, lightheadedness, headaches, syncope, orthopnea, or PND.  ROS:  Please see the history of present illness. All other systems are reviewed and negative.   Studies Reviewed: .        14 Day Zio Monitor  02/2023:  Quality: Fair.  Baseline artifact. Predominant rhythm: atrial fibrillation Average heart rate: 78 bpm Max heart rate: 165 bpm Min heart rate: 51 bpm Pauses >2.5 seconds: none   Rare PVCs (<1%)  Risk Assessment/Calculations:    CHA2DS2-VASc Score = 2   This indicates a 2.2% annual risk of stroke. The patient's score is based upon: CHF History: 0 HTN History: 1 Diabetes History: 0 Stroke History: 0 Vascular Disease History: 0 Age Score: 0 Gender Score: 1   Physical Exam:    VS:  BP 100/80 (BP Location: Right Arm, Patient Position: Sitting, Cuff Size: Large)   Pulse 92   Ht 5\' 9"  (1.753 m)   Wt (!) 417 lb 8 oz (189.4 kg)   SpO2 96%   BMI 61.65 kg/m  , BMI Body mass index is 61.65 kg/m. GENERAL:  Well appearing HEENT: Pupils equal round and reactive, fundi not visualized, oral mucosa unremarkable NECK:  No jugular venous distention, waveform within normal limits,  carotid upstroke brisk and symmetric, no bruits, no thyromegaly LUNGS:  Clear to auscultation bilaterally HEART:  Irregularly irregular.  PMI not displaced or sustained,S1 and S2 within normal limits, no S3, no S4, no clicks, no rubs, no murmurs ABD:  Flat, positive bowel sounds normal in frequency in pitch, no bruits, no rebound, no guarding, no midline pulsatile mass, no hepatomegaly, no splenomegaly EXT:  2 plus pulses throughout, no edema, no cyanosis no clubbing SKIN:  No rashes no nodules NEURO:  Cranial nerves II through XII grossly intact,  motor grossly intact throughout PSYCH:  Cognitively intact, oriented to person place and time  Wt Readings from Last 3 Encounters:  08/25/23 (!) 417 lb 8 oz (189.4 kg)  06/16/23 (!) 399 lb 9.6 oz (181.3 kg)  05/12/23 (!) 398 lb 12.8 oz (180.9 kg)     ASSESSMENT AND PLAN: .    # Atrial Fibrillation Asymptomatic, on Eliquis for stroke prevention. Discussed the risk/benefit of continuing Eliquis vs Watchman device. Patient tolerating Eliquis well with no excessive bruising or bleeding since resolution of kidney stones. -Continue Eliquis. -Discussed the importance of avoiding heavy alcohol and caffeine.  # Sleep Apnea Using CPAP machine regularly. Discussed potential benefits of CPAP use on Afib. -Continue regular use of CPAP machine.  # Hyperlipidemia Last cholesterol check in December 2022. -Plan to check cholesterol levels at upcoming primary care appointment in November 2024.  Follow-up in 1 year or sooner if needed. Scheduled for an echocardiogram in January 2025.          Dispo:  FU with Joycie Aerts C. Duke Salvia, MD, [facility] in 1 year.  I,Mathew Stumpf,acting as a Neurosurgeon for Chilton Si, MD.,have documented all relevant documentation on the behalf of Chilton Si, MD,as directed by  Chilton Si, MD while in the presence of Chilton Si, MD.  I, Jenniffer Vessels C. Duke Salvia, MD have reviewed all documentation for this visit.  The documentation of the exam, diagnosis, procedures, and orders on 09/11/2023 are all accurate and complete.   Signed, Chilton Si, MD

## 2023-08-25 NOTE — Progress Notes (Deleted)
Cardiology Office Note:  .   Date:  08/25/2023  ID:  [name], DOB 19-Dec-1965, MRN [id number] PCP: Jarrett Soho, PA-C  [facility] Providers Cardiologist:  Chilton Si, MD { Click to update primary MD,subspecialty MD or APP then REFRESH:1}   History of Present Illness: .   [name] is a 57 y.o. female with a hx of hypertension, PAF, OSA, and morbid obesity who presents for follow up.  She was initially seen 07/2017 for an evaluation of a murmur.  [name] went for her yearly exam with [name] 06/2017 and was noted to have a heart murmur.  She has seen [name] for years and this was a new finding.  She had an echo 08/22/17 that revealed LVEF 65-70% with grade 1 diastolic dysfunction and mild aortic stenosis.  Metoprolol was switched to carvedilol due to elevated blood pressure.  HCTZ-triamterene was added and then increased by our pharmacist 09/2017.    [name] had an echo 11/2022 and was noted to be in atrial fibrillation.  Echo at that time revealed stable LVEF and mild aortic stenosis.  She was asymptomatic and rate controlled.  She was started on Eliquis.  She was found to have OSA on CPAP.  She wore a 14-day monitor 02/2023 that revealed predominantly atrial fibrillation that was well rate controlled.  She last saw Gillian Shields, NP 04/2023 and was struggling with her CPAP but otherwise doing well.  ROS: ***  Studies Reviewed: .        *** Risk Assessment/Calculations:   {Does this patient have ATRIAL FIBRILLATION?:[id number]} No BP recorded.  {Refresh Note OR Click here to enter BP  :1}***   STOP-Bang Score:  4  { Consider Dx Sleep Disordered Breathing or Sleep Apnea  ICD G47.33          :1}    Physical Exam:   VS:  There were no vitals taken for this visit.   Wt Readings from Last 3 Encounters:  06/16/23 (!) 399 lb 9.6 oz (181.3 kg)  05/12/23 (!) 398 lb 12.8 oz (180.9 kg)  04/25/23 (!) 395 lb (179.2 kg)    GEN: Well nourished, well  developed in no acute distress NECK: No JVD; No carotid bruits CARDIAC: ***RRR, no murmurs, rubs, gallops RESPIRATORY:  Clear to auscultation without rales, wheezing or rhonchi  ABDOMEN: Soft, non-tender, non-distended EXTREMITIES:  No edema; No deformity   ASSESSMENT AND PLAN: .   ***    {Are you ordering a CV Procedure (e.g. stress test, cath, DCCV, TEE, etc)?   Press F2        :[id number]}  Dispo: ***  Signed, Chilton Si, MD

## 2023-09-07 DIAGNOSIS — G4733 Obstructive sleep apnea (adult) (pediatric): Secondary | ICD-10-CM | POA: Diagnosis not present

## 2023-09-11 ENCOUNTER — Encounter (HOSPITAL_BASED_OUTPATIENT_CLINIC_OR_DEPARTMENT_OTHER): Payer: Self-pay | Admitting: Cardiovascular Disease

## 2023-09-13 DIAGNOSIS — M170 Bilateral primary osteoarthritis of knee: Secondary | ICD-10-CM | POA: Diagnosis not present

## 2023-09-16 DIAGNOSIS — G4733 Obstructive sleep apnea (adult) (pediatric): Secondary | ICD-10-CM | POA: Diagnosis not present

## 2023-10-02 DIAGNOSIS — Z01419 Encounter for gynecological examination (general) (routine) without abnormal findings: Secondary | ICD-10-CM | POA: Diagnosis not present

## 2023-10-02 DIAGNOSIS — Z1330 Encounter for screening examination for mental health and behavioral disorders, unspecified: Secondary | ICD-10-CM | POA: Diagnosis not present

## 2023-10-13 ENCOUNTER — Ambulatory Visit
Admission: RE | Admit: 2023-10-13 | Discharge: 2023-10-13 | Disposition: A | Discharge status: Home / Self Care | Payer: Federal, State, Local not specified - PPO | Source: Ambulatory Visit | Attending: Obstetrics and Gynecology | Admitting: Obstetrics and Gynecology

## 2023-10-13 DIAGNOSIS — R928 Other abnormal and inconclusive findings on diagnostic imaging of breast: Secondary | ICD-10-CM | POA: Diagnosis not present

## 2023-10-13 DIAGNOSIS — N6489 Other specified disorders of breast: Secondary | ICD-10-CM

## 2023-11-03 LAB — EXTERNAL GENERIC LAB PROCEDURE: COLOGUARD: POSITIVE — AB

## 2023-11-03 LAB — COLOGUARD: COLOGUARD: POSITIVE — AB

## 2023-11-07 DIAGNOSIS — G4733 Obstructive sleep apnea (adult) (pediatric): Secondary | ICD-10-CM | POA: Diagnosis not present

## 2023-12-06 ENCOUNTER — Other Ambulatory Visit: Payer: Self-pay | Admitting: Cardiovascular Disease

## 2023-12-06 ENCOUNTER — Other Ambulatory Visit (HOSPITAL_BASED_OUTPATIENT_CLINIC_OR_DEPARTMENT_OTHER): Payer: Self-pay | Admitting: Cardiology

## 2023-12-06 ENCOUNTER — Other Ambulatory Visit: Payer: Self-pay | Admitting: Psychiatry

## 2023-12-06 DIAGNOSIS — F422 Mixed obsessional thoughts and acts: Secondary | ICD-10-CM

## 2023-12-06 DIAGNOSIS — F338 Other recurrent depressive disorders: Secondary | ICD-10-CM

## 2023-12-08 ENCOUNTER — Telehealth: Payer: Self-pay

## 2023-12-08 DIAGNOSIS — I4891 Unspecified atrial fibrillation: Secondary | ICD-10-CM | POA: Diagnosis not present

## 2023-12-08 DIAGNOSIS — R195 Other fecal abnormalities: Secondary | ICD-10-CM | POA: Diagnosis not present

## 2023-12-08 DIAGNOSIS — G4733 Obstructive sleep apnea (adult) (pediatric): Secondary | ICD-10-CM | POA: Diagnosis not present

## 2023-12-08 NOTE — Telephone Encounter (Signed)
   Pre-operative Risk Assessment    Patient Name: [name]  DOB: February 28, 1966 MRN: [id number]   Date of last office visit: 08/25/23 with Dr. Raford  Date of next office visit: None  - Having an Echo on 12/15/23     Request for Surgical Clearance    Procedure:   Colonoscopy  Date of Surgery:  Clearance TBD                                Surgeon:  Dr. Elsie Cree Surgeon's Group or Practice Name:  [facility]  Phone number:  [phone] Fax number:  [phone]   Type of Clearance Requested:   - Medical  - Pharmacy:  Hold Apixaban  (Eliquis ) not indicated    Type of Anesthesia:   Propofol    Additional requests/questions:    [name]   12/08/2023, 2:00 PM

## 2023-12-11 ENCOUNTER — Telehealth: Payer: Self-pay | Admitting: *Deleted

## 2023-12-11 NOTE — Telephone Encounter (Signed)
Pharmacy please advise on holding Eliquis prior to Colonoscopy scheduled for TBD. Thank you.

## 2023-12-11 NOTE — Telephone Encounter (Signed)
 Pt has been scheduled tele preop appt 01/22/24 as pt states that procedure will not be until March sometime.   Med rec and consent are done.

## 2023-12-11 NOTE — Telephone Encounter (Signed)
 Patient with diagnosis of afib on Eliquis  for anticoagulation.    Procedure: colonoscopy Date of procedure: TBD   CHA2DS2-VASc Score = 2   This indicates a 2.2% annual risk of stroke. The patient's score is based upon: CHF History: 0 HTN History: 1 Diabetes History: 0 Stroke History: 0 Vascular Disease History: 0 Age Score: 0 Gender Score: 1       CrCl >100 ml/min Platelet count 311  Per office protocol, patient can hold Eliquis  for 2 days prior to procedure.    **This guidance is not considered finalized until pre-operative APP has relayed final recommendations.**

## 2023-12-11 NOTE — Telephone Encounter (Signed)
 Pt has been scheduled tele preop appt 01/22/24 as pt states that procedure will not be until March sometime.  Med rec and consent are done.     Patient Consent for Virtual Visit        [name] has provided verbal consent on 12/11/2023 for a virtual visit (video or telephone).   CONSENT FOR VIRTUAL VISIT FOR:  [name]  By participating in this virtual visit I agree to the following:  I hereby voluntarily request, consent and authorize [facility] and its employed or contracted physicians, physician assistants, nurse practitioners or other licensed health care professionals (the Practitioner), to provide me with telemedicine health care services (the "Services) as deemed necessary by the treating Practitioner. I acknowledge and consent to receive the Services by the Practitioner via telemedicine. I understand that the telemedicine visit will involve communicating with the Practitioner through live audiovisual communication technology and the disclosure of certain medical information by electronic transmission. I acknowledge that I have been given the opportunity to request an in-person assessment or other available alternative prior to the telemedicine visit and am voluntarily participating in the telemedicine visit.  I understand that I have the right to withhold or withdraw my consent to the use of telemedicine in the course of my care at any time, without affecting my right to future care or treatment, and that the Practitioner or I may terminate the telemedicine visit at any time. I understand that I have the right to inspect all information obtained and/or recorded in the course of the telemedicine visit and may receive copies of available information for a reasonable fee.  I understand that some of the potential risks of receiving the Services via telemedicine include:  Delay or interruption in medical evaluation due to technological equipment failure or  disruption; Information transmitted may not be sufficient (e.g. poor resolution of images) to allow for appropriate medical decision making by the Practitioner; and/or  In rare instances, security protocols could fail, causing a breach of personal health information.  Furthermore, I acknowledge that it is my responsibility to provide information about my medical history, conditions and care that is complete and accurate to the best of my ability. I acknowledge that Practitioner's advice, recommendations, and/or decision may be based on factors not within their control, such as incomplete or inaccurate data provided by me or distortions of diagnostic images or specimens that may result from electronic transmissions. I understand that the practice of medicine is not an exact science and that Practitioner makes no warranties or guarantees regarding treatment outcomes. I acknowledge that a copy of this consent can be made available to me via my patient portal [facility] MyChart), or I can request a printed copy by calling the office of  [facility].    I understand that my insurance will be billed for this visit.   I have read or had this consent read to me. I understand the contents of this consent, which adequately explains the benefits and risks of the Services being provided via telemedicine.  I have been provided ample opportunity to ask questions regarding this consent and the Services and have had my questions answered to my satisfaction. I give my informed consent for the services to be provided through the use of telemedicine in my medical care

## 2023-12-11 NOTE — Telephone Encounter (Signed)
   Name: [name]  DOB: 02/27/1966  MRN: [id number]  Primary Cardiologist: Annabella Scarce, MD   Preoperative team, please contact this patient and set up a phone call appointment for further preoperative risk assessment. Please obtain consent and complete medication review. Thank you for your help.  Please schedule after patient completes echo on 12/15/2023  I confirm that guidance regarding antiplatelet and oral anticoagulation therapy has been completed and, if necessary, noted below.  Per office protocol, patient can hold Eliquis  for 2 days prior to procedure.    I also confirmed the patient resides in the state of [place] . As per [facility] Medical Board telemedicine laws, the patient must reside in the state in which the provider is licensed.   Wyn Raddle, Jackee Shove, NP 12/11/2023, 9:52 AM [facility]

## 2023-12-12 ENCOUNTER — Other Ambulatory Visit (HOSPITAL_BASED_OUTPATIENT_CLINIC_OR_DEPARTMENT_OTHER): Payer: Self-pay | Admitting: Cardiovascular Disease

## 2023-12-13 NOTE — Telephone Encounter (Signed)
 Prescription refill request for Eliquis  received. Indication: AF Last office visit: 08/25/23  Caryn Clause MD Scr: 0.92 on 04/25/23  Epic Age: 59 Weight: 189.4kg  Based on above findings Eliquis  5mg  twice daily is the appropriate dose.  Refill approved.

## 2023-12-15 ENCOUNTER — Encounter (HOSPITAL_BASED_OUTPATIENT_CLINIC_OR_DEPARTMENT_OTHER): Payer: Self-pay

## 2023-12-15 ENCOUNTER — Ambulatory Visit (HOSPITAL_BASED_OUTPATIENT_CLINIC_OR_DEPARTMENT_OTHER): Payer: Federal, State, Local not specified - PPO

## 2023-12-15 DIAGNOSIS — I350 Nonrheumatic aortic (valve) stenosis: Secondary | ICD-10-CM

## 2023-12-15 LAB — ECHOCARDIOGRAM COMPLETE
AR max vel: 1.4 cm2
AV Area VTI: 1.53 cm2
AV Area mean vel: 1.33 cm2
AV Mean grad: 12.7 mm[Hg]
AV Peak grad: 23.1 mm[Hg]
Ao pk vel: 2.4 m/s
Area-P 1/2: 4.19 cm2
MV M vel: 4.26 m/s
MV Peak grad: 72.6 mm[Hg]
S' Lateral: 2.4 cm

## 2023-12-18 ENCOUNTER — Telehealth (HOSPITAL_BASED_OUTPATIENT_CLINIC_OR_DEPARTMENT_OTHER): Payer: Self-pay

## 2023-12-18 DIAGNOSIS — I350 Nonrheumatic aortic (valve) stenosis: Secondary | ICD-10-CM

## 2023-12-18 NOTE — Telephone Encounter (Signed)
-----   Message from Alver Sorrow sent at 12/15/2023  5:05 PM EST ----- Echocardiogram stable compared to previous.  Mild aortic stenosis which is unchanged.  Recommend continue current medications and follow-up as scheduled.  Recommend repeat echocardiogram in 1 year for monitoring.

## 2023-12-22 ENCOUNTER — Encounter (HOSPITAL_BASED_OUTPATIENT_CLINIC_OR_DEPARTMENT_OTHER): Payer: Self-pay

## 2024-01-01 DIAGNOSIS — F411 Generalized anxiety disorder: Secondary | ICD-10-CM | POA: Diagnosis not present

## 2024-01-08 DIAGNOSIS — G4733 Obstructive sleep apnea (adult) (pediatric): Secondary | ICD-10-CM | POA: Diagnosis not present

## 2024-01-09 DIAGNOSIS — F411 Generalized anxiety disorder: Secondary | ICD-10-CM | POA: Diagnosis not present

## 2024-01-16 DIAGNOSIS — F411 Generalized anxiety disorder: Secondary | ICD-10-CM | POA: Diagnosis not present

## 2024-01-17 ENCOUNTER — Telehealth: Payer: Federal, State, Local not specified - PPO | Admitting: Psychiatry

## 2024-01-17 ENCOUNTER — Encounter: Payer: Self-pay | Admitting: Psychiatry

## 2024-01-17 DIAGNOSIS — G4733 Obstructive sleep apnea (adult) (pediatric): Secondary | ICD-10-CM | POA: Diagnosis not present

## 2024-01-17 DIAGNOSIS — F422 Mixed obsessional thoughts and acts: Secondary | ICD-10-CM

## 2024-01-17 DIAGNOSIS — F338 Other recurrent depressive disorders: Secondary | ICD-10-CM | POA: Diagnosis not present

## 2024-01-17 MED ORDER — FLUOXETINE HCL 20 MG PO CAPS: 60.0000 mg | ORAL_CAPSULE | Freq: Every day | ORAL | 3 refills | Status: DC

## 2024-01-17 NOTE — Progress Notes (Signed)
[name] [id number] [birth year] 58 y.o.  Video Visit via My Chart  I connected with pt by My Chart and verified that I am speaking with the correct person using two identifiers.   I discussed the limitations, risks, security and privacy concerns of performing an evaluation and management service by My Chart  and the availability of in person appointments. I also discussed with the patient that there may be a patient responsible charge related to this service. The patient expressed understanding and agreed to proceed.  I discussed the assessment and treatment plan with the patient. The patient was provided an opportunity to ask questions and all were answered. The patient agreed with the plan and demonstrated an understanding of the instructions.   The patient was advised to call back or seek an in-person evaluation if the symptoms worsen or if the condition fails to improve as anticipated.  I provided 15 minutes of video time during this encounter.  The patient was located at home and the provider was located office. Session from 1045 until 11:00 AM.  Subjective:   Patient ID:  [name] is a 58 y.o. (DOB 05-12-1966) female.  Chief Complaint:  Chief Complaint  Patient presents with   Follow-up   Anxiety   Depression  FU anxiety and mood.   HPI  [name] presents to the office today for follow-up of depression with seasonality and anxiety.     seen July 2020.  No meds were changed.  04/10/20 appt.  The following noted: Teaches online.  Difficult during Covid.  Vaccinated which helped stress over the virus.  Able to get out more.  Less anxiety.   Difficult to stay home all these months.  Good vacation helped.  Mood generally OK but Covid hard and stress of school uncertainties.  Before vacation hard to get OOB but better since then.  Not really obsessing.  Works from home.  That helps keep her on track and schedule. No worsening OCD but anxiety with Covid is  some better.  Last summer hard to get OOB but OK now. Ok this winter so far.  Vitamin D helped. Likes benefit lorazepam.  Only used rarely.  Otherwise usually ok. Plan: Continue fluoxetine 60 mg daily  12/22/2021 appt noted: Use lorazepam just as needed for anxiety and not often. Remains on fluoxetine 60 mg daily.  No SE Good not much obsessing unless really tired.   Stressful season bc H working a lot so everything falls to her.  Coping well. Not much seasonal depression.  Taking vitamin D in fall winter. D [place] grad HS this year and going to [employer] Plan: no med changes  06/23/22 appt noted: Consistent with fluoxetine 60. No SE Use lorazepam prn when visits family to keep from panic.  Not panic lately.  Anxiety is good. 3 weeks away H retiring and D going off to college.  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 Minimal obsessing unless really tired.  Under control.   Patient reports stable mood and denies depressed or irritable moods.  Patient denies any recent difficulty with anxiety.  Patient denies difficulty with sleep initiation or maintenance. Denies appetite disturbance.  Patient reports that energy and motivation have been good.  Patient denies any difficulty with concentration.  Patient denies any suicidal ideation. Health stable.  Works from home. Plan: No change indicated.  Continue fluoxetine 60 mg daily Rare lorzepam prn  12/29/22 appt noted: Good overall.  Needed more lorazepam  bc need more at [facility].  But not this month as much.  When with family needed more. 2 weeks ago dx afib and changed meds to metoprolol. Afib did not cause sx.   Anxiety ok now.  OCD is controlled.  No sig dep.  Sleep is good and no problems with meds. [place] adjusted to school at college.   Plan: no changes  07/06/23 appt noted: Meds as above rare lorazepam like with medical procedures and it helped. Dx afib on Eloquis.   But also severe OSA started CPAP May  nightly 5-6 hours+. AHI 32 and treated.  Feels more alert during the day.  Dream more.  Restorative sleep and better energy. Mood and anxiety good.  OCD managed.  Minimal unless overly tired.  Mentally fine.   No SE with meds.   Still working.  H retired from [employer] last year.  Will work another couple of years. D will start 2nd year college this year. Plan no changes  01/17/24 appt noted: Meds: Fluoxetine 60, lorazepam 0.5 every 8 hours as needed Only use lorazepam around her family. Sleep is good.  Using CPAP and 100% better sleep.  Didn't realize how bad it was until using CPAP machine.     Mood and anxiety good.  OCD managed.  Less checking and mentally better with better sleep.  Anxiety ok. A  lot going on with work changes.  Nothing major otherwise. D settled into [place] and doing well.  [place] 2 more years of school. No concerns with meds.  Past Psychiatric Medication Trials:' no others. Fluoxetine 60 lorazepam Under the care of [facility] for many many years.  Review of Systems:  Review of Systems  Constitutional:  Negative for fatigue.  Respiratory:  Negative for shortness of breath.   Cardiovascular:  Negative for palpitations.  Musculoskeletal:  Positive for arthralgias, back pain and gait problem.  Neurological:  Negative for tremors.  Psychiatric/Behavioral:  Negative for agitation, behavioral problems, confusion, decreased concentration, dysphoric mood, hallucinations, self-injury, sleep disturbance and suicidal ideas. The patient is not nervous/anxious and is not hyperactive.     Medications: I have reviewed the patient's current medications.  Current Outpatient Medications  Medication Sig Dispense Refill   apixaban (ELIQUIS) 5 MG TABS tablet TAKE 1 TABLET(5 MG) BY MOUTH TWICE DAILY 180 tablet 1   Biotin 5 MG CAPS Take 5 mg by mouth daily.     Cyanocobalamin (VITAMIN B-12) 5000 MCG TBDP Take 5,000 mcg by mouth in the morning.      diphenhydramine-acetaminophen (TYLENOL PM) 25-500 MG TABS Take 2 tablets by mouth at bedtime.     FLUoxetine (PROZAC) 20 MG capsule TAKE 3 CAPSULES(60 MG) BY MOUTH DAILY 270 capsule 0   LORazepam (ATIVAN) 0.5 MG tablet TAKE 1 TABLET(0.5 MG) BY MOUTH EVERY 8 HOURS AS NEEDED FOR ANXIETY 30 tablet 2   metoprolol tartrate (LOPRESSOR) 50 MG tablet TAKE 1 TABLET(50 MG) BY MOUTH TWICE DAILY 180 tablet 3   Multiple Vitamin (MULTIVITAMIN WITH MINERALS) TABS tablet Take 1 tablet by mouth in the morning and at bedtime.     NORLYDA 0.35 MG tablet Take 0.35 mg by mouth every evening.     triamterene-hydrochlorothiazide (MAXZIDE-25) 37.5-25 MG tablet TAKE 1 TABLET BY MOUTH DAILY 90 tablet 1   No current facility-administered medications for this visit.    Medication Side Effects: None  Allergies:  Allergies  Allergen Reactions   Darvocet [Propoxyphene N-Acetaminophen] Rash   Penicillins Swelling    Eye swelling  Past Medical History:  Diagnosis Date   Anemia    Anxiety    Aortic stenosis 11/04/2021   Arthritis    knees    Atrial fibrillation (HCC)    Headache    hx of migraines    History of hiatal hernia    History of kidney stones    Hypertension    LVH (left ventricular hypertrophy) due to hypertensive disease 11/2022   Morbid obesity with BMI of 60.0-69.9, adult (HCC)    BMI 62   Murmur 08/15/2017   Pneumonia 07/18/2011   hx of pneumonia after gastric bypass in 2015   Preoperative evaluation of a medical condition to rule out surgical contraindications (TAR required) 11/04/2021   Sleep apnea    cpap    Family History  Problem Relation Age of Onset   Arrhythmia Mother        atrial fibrillation   Hypertension Mother    Arrhythmia Father        atrial fibrillation   Hypertension Father    Cancer Father        prostate   Diabetes Father    Colon cancer Father    Kidney Stones Father    Hypertension Brother    Diabetes Paternal Aunt    Stroke Paternal Aunt    Mental  illness Paternal Aunt    Diabetes Paternal Uncle    Breast cancer Maternal Grandmother    Heart disease Maternal Grandfather    Heart attack Maternal Grandfather    Cancer Paternal Grandmother        liver    Social History   Socioeconomic History   Marital status: Married    Spouse name: Not on file   Number of children: Not on file   Years of education: Not on file   Highest education level: Not on file  Occupational History   Not on file  Tobacco Use   Smoking status: Never   Smokeless tobacco: Never  Vaping Use   Vaping status: Never Used  Substance and Sexual Activity   Alcohol use: No   Drug use: No   Sexual activity: Yes    Birth control/protection: Pill    Comment: azurette  Other Topics Concern   Not on file  Social History Narrative   Not on file   Social Drivers of Health   Financial Resource Strain: Not on file  Food Insecurity: Not on file  Transportation Needs: Not on file  Physical Activity: Not on file  Stress: Not on file  Social Connections: Not on file  Intimate Partner Violence: Not on file    Past Medical History, Surgical history, Social history, and Family history were reviewed and updated as appropriate.   Please see review of systems for further details on the patient's review from today.   Objective:   Physical Exam:  There were no vitals taken for this visit.  Physical Exam Neurological:     Mental Status: She is alert and oriented to person, place, and time.     Cranial Nerves: No dysarthria.  Psychiatric:        Attention and Perception: Attention and perception normal.        Mood and Affect: Mood is not anxious or depressed.        Speech: Speech normal.        Behavior: Behavior is cooperative.        Thought Content: Thought content normal. Thought content is not paranoid or delusional. Thought content does not  include homicidal or suicidal ideation. Thought content does not include suicidal plan.        Cognition and  Memory: Cognition and memory normal.        Judgment: Judgment normal.     Comments: Insight intact Minimal obsessions     Lab Review:     Component Value Date/Time   NA 140 04/25/2023 0747   NA 140 01/16/2023 0951   K 3.4 (L) 04/25/2023 0747   CL 104 04/25/2023 0747   CO2 26 04/25/2023 0747   GLUCOSE 100 (H) 04/25/2023 0747   BUN 17 04/25/2023 0747   BUN 21 01/16/2023 0951   CREATININE 0.92 04/25/2023 0747   CREATININE 0.57 08/25/2014 0836   CALCIUM 8.7 (L) 04/25/2023 0747   PROT 6.3 (L) 11/17/2021 1053   PROT 6.5 12/11/2020 1122   ALBUMIN 3.6 11/17/2021 1053   ALBUMIN 4.2 12/11/2020 1122   AST 19 11/17/2021 1053   ALT 17 11/17/2021 1053   ALKPHOS 87 11/17/2021 1053   BILITOT 1.0 11/17/2021 1053   BILITOT 0.7 12/11/2020 1122   GFRNONAA >60 04/25/2023 0747   GFRAA 95 12/11/2020 1122       Component Value Date/Time   WBC 11.9 (H) 04/25/2023 0747   RBC 4.79 04/25/2023 0747   HGB 13.8 04/25/2023 0747   HGB 15.0 01/16/2023 0951   HGB 13.1 06/05/2014 0920   HCT 44.3 04/25/2023 0747   HCT 47.4 (H) 01/16/2023 0951   HCT 41.1 06/05/2014 0920   PLT 311 04/25/2023 0747   PLT 341 01/16/2023 0951   MCV 92.5 04/25/2023 0747   MCV 89 01/16/2023 0951   MCV 89.2 06/05/2014 0920   MCH 28.8 04/25/2023 0747   MCHC 31.2 04/25/2023 0747   RDW 14.9 04/25/2023 0747   RDW 13.3 01/16/2023 0951   RDW 14.3 06/05/2014 0920   LYMPHSABS 1.4 01/01/2015 0621   LYMPHSABS 1.9 06/05/2014 0920   MONOABS 1.6 (H) 01/01/2015 0621   MONOABS 0.9 06/05/2014 0920   EOSABS 0.1 01/01/2015 0621   EOSABS 0.2 06/05/2014 0920   BASOSABS 0.0 01/01/2015 0621   BASOSABS 0.1 06/05/2014 0920    No results found for: "POCLITH", "LITHIUM"   No results found for: "PHENYTOIN", "PHENOBARB", "VALPROATE", "CBMZ"   .res Assessment: Plan:    [name] was seen today for follow-up, anxiety and depression.  Diagnoses and all orders for this visit:  Mixed obsessional thoughts and acts  Seasonal depression  (HCC)  Severe obstructive sleep apnea    30 min video face to face time with patient was spent on counseling and coordination of care. We discussed OCD manageable.  Not much depression at present.  Tolerating meds.  Rare BZ. High relapse risk in OCD without SSRI .  She agrees to continue  No change indicated.  Continue fluoxetine 60 mg daily Rare lorzepam prn  We discussed the short-term risks associated with benzodiazepines including sedation and increased fall risk among others.  Discussed long-term side effect risk including dependence, potential withdrawal symptoms, and the potential eventual dose-related risk of dementia.  But recent studies from 2020 dispute this association between benzodiazepines and dementia risk. Newer studies in 2020 do not support an association with dementia.  Extensive discussion of importance of CPAP for brain health and OSA noted.  She is compliant and benefiting.  H retired and a bit of adjustment for her.  Been about 18 mos now.  No med changes  FU 12 mos  Meredith Staggers, MD, DFAPA   Please see After Visit  Summary for patient specific instructions.  Future Appointments  Date Time Provider Department Center  01/22/2024 10:00 AM [facility] PRE OP CLEARANCE APP [facility] None    No orders of the defined types were placed in this encounter.     -------------------------------

## 2024-01-22 ENCOUNTER — Ambulatory Visit: Payer: Federal, State, Local not specified - PPO | Attending: Cardiovascular Disease

## 2024-01-22 DIAGNOSIS — Z01810 Encounter for preprocedural cardiovascular examination: Secondary | ICD-10-CM

## 2024-01-22 NOTE — Progress Notes (Signed)
 Virtual Visit via Telephone Note   Because of [name] co-morbid illnesses, she is at least at moderate risk for complications without adequate follow up.  This format is felt to be most appropriate for this patient at this time.  Due to technical limitations with video connection (technology), today's appointment will be conducted as an audio only telehealth visit, and [name] verbally agreed to proceed in this manner.   All issues noted in this document were discussed and addressed.  No physical exam could be performed with this format.  Evaluation Performed:  Preoperative cardiovascular risk assessment _____________   Date:  01/22/2024   Patient ID:  [name], DOB 1966/11/20, MRN [id number] Patient Location:  Home Provider location:   Office  Primary Care Provider:  Jarrett Soho, PA-C Primary Cardiologist:  Chilton Si, MD  Chief Complaint / Patient Profile   58 y.o. y/o female with a h/o HTN, PAF, OSA, morbid obesity who is pending colonoscopy and presents today for telephonic preoperative cardiovascular risk assessment.  History of Present Illness    [name] is a 58 y.o. female who presents via audio/video conferencing for a telehealth visit today.  Pt was last seen in cardiology clinic on 08/25/2023 by Dr. Duke Salvia.  At that time Kamilia Carollo was doing well with stable BP and was completing chair yoga for exercise.  The patient is now pending procedure as outlined above. Since her last visit, she has been doing well with no new cardiac complaints.  She denies chest pain, shortness of breath, lower extremity edema, fatigue, palpitations, melena, hematuria, hemoptysis, diaphoresis, weakness, presyncope, syncope, orthopnea, and PND.   Past Medical History    Past Medical History:  Diagnosis Date   Anemia    Anxiety    Aortic stenosis 11/04/2021   Arthritis    knees    Atrial fibrillation (HCC)    Headache    hx of  migraines    History of hiatal hernia    History of kidney stones    Hypertension    LVH (left ventricular hypertrophy) due to hypertensive disease 11/2022   Morbid obesity with BMI of 60.0-69.9, adult (HCC)    BMI 62   Murmur 08/15/2017   Pneumonia 07/18/2011   hx of pneumonia after gastric bypass in 2015   Preoperative evaluation of a medical condition to rule out surgical contraindications (TAR required) 11/04/2021   Sleep apnea    cpap   Past Surgical History:  Procedure Laterality Date   APPENDECTOMY     BREAST BIOPSY Left 09/14/2020   BREAST BIOPSY Right 10/14/2022   [place] RT BREAST BX W LOC DEV 1ST LESION IMG BX SPEC US GUIDE 10/14/2022 [facility]   CARDIOVERSION N/A 01/25/2023   Procedure: CARDIOVERSION;  Surgeon: Jake Bathe, MD;  Location: [facility];  Service: Cardiovascular;  Laterality: N/A;   CARPAL TUNNEL RELEASE Right 11/23/2021   Procedure: CARPAL TUNNEL RELEASE;  Surgeon: Frederico Hamman, MD;  Location: [facility];  Service: Orthopedics;  Laterality: Right;   CESAREAN SECTION     CHOLECYSTECTOMY     CYSTOSCOPY/URETEROSCOPY/HOLMIUM LASER/STENT PLACEMENT Bilateral 04/11/2023   Procedure: DIAGNOSTIC CYSTOSCOPY; LEFT URETEROSCOPY AND HOLMIUM LASER LITHOTRIPSY; BILATERAL URETERAL STENT PLACEMENT;  Surgeon: Noel Christmas, MD;  Location: [facility];  Service: Urology;  Laterality: Bilateral;  90 MINUTES   CYSTOSCOPY/URETEROSCOPY/HOLMIUM LASER/STENT PLACEMENT Bilateral 04/25/2023   Procedure: DIAGNOSTIC CYSTOSCOPY, RIGHT URETEROSCOPY, HOLMIUM LASER LITHOTRIPSY, AND RIGHT URETERAL STENT EXCHANGE, LEFT STENT REMOVAL;  Surgeon: Arita Miss,  Weyman Croon, MD;  Location: [facility];  Service: Urology;  Laterality: Bilateral;  90 MINUTES   LAPAROSCOPIC ROUX-EN-Y GASTRIC BYPASS WITH HIATAL HERNIA REPAIR N/A 12/30/2014   Procedure: LAPAROSCOPIC ROUX-EN-Y GASTRIC BYPASS WITH HIATAL HERNIA REPAIR AND UPPER ENDOSCOPY;  Surgeon: [name], MD;  Location: [facility];  Service: General;  Laterality:  N/A;   TONSILLECTOMY      Allergies  Allergies  Allergen Reactions   Darvocet [Propoxyphene N-Acetaminophen] Rash   Penicillins Swelling    Eye swelling    Home Medications    Prior to Admission medications   Medication Sig Start Date End Date Taking? Authorizing Provider  apixaban (ELIQUIS) 5 MG TABS tablet TAKE 1 TABLET(5 MG) BY MOUTH TWICE DAILY 12/13/23   Chilton Si, MD  Biotin 5 MG CAPS Take 5 mg by mouth daily.    [provider]  Cyanocobalamin (VITAMIN B-12) 5000 MCG TBDP Take 5,000 mcg by mouth in the morning.    [provider]  diphenhydramine-acetaminophen (TYLENOL PM) 25-500 MG TABS Take 2 tablets by mouth at bedtime.    [provider]  FLUoxetine (PROZAC) 20 MG capsule Take 3 capsules (60 mg total) by mouth daily. 01/17/24   Cottle, Steva Ready., MD  LORazepam (ATIVAN) 0.5 MG tablet TAKE 1 TABLET(0.5 MG) BY MOUTH EVERY 8 HOURS AS NEEDED FOR ANXIETY 12/29/22   Cottle, Steva Ready., MD  metoprolol tartrate (LOPRESSOR) 50 MG tablet TAKE 1 TABLET(50 MG) BY MOUTH TWICE DAILY 12/13/23   Chilton Si, MD  Multiple Vitamin (MULTIVITAMIN WITH MINERALS) TABS tablet Take 1 tablet by mouth in the morning and at bedtime.    [provider]  NORLYDA 0.35 MG tablet Take 0.35 mg by mouth every evening. 08/24/20   [provider]  triamterene-hydrochlorothiazide (MAXZIDE-25) 37.5-25 MG tablet TAKE 1 TABLET BY MOUTH DAILY 12/06/23   Chilton Si, MD    Physical Exam    Vital Signs:  [name] does not have vital signs available for review today.135/80  Given telephonic nature of communication, physical exam is limited. AAOx3. NAD. Normal affect.  Speech and respirations are unlabored.  Accessory Clinical Findings    None  Assessment & Plan    1.  Preoperative Cardiovascular Risk Assessment: -Patient's RCRI score is 0.4%  The patient affirms she has been doing well without any new cardiac symptoms. They are able to  achieve 7 METS without cardiac limitations. Therefore, based on ACC/AHA guidelines, the patient would be at acceptable risk for the planned procedure without further cardiovascular testing. The patient was advised that if she develops new symptoms prior to surgery to contact our office to arrange for a follow-up visit, and she verbalized understanding.   The patient was advised that if she develops new symptoms prior to surgery to contact our office to arrange for a follow-up visit, and she verbalized understanding.  Per office protocol, patient can hold Eliquis for 2 days prior to procedure.    A copy of this note will be routed to requesting surgeon.  Time:   Today, I have spent 8 minutes with the patient with telehealth technology discussing medical history, symptoms, and management plan.     Napoleon Form, Leodis Rains, NP  01/22/2024, 9:44 AM

## 2024-01-24 DIAGNOSIS — F411 Generalized anxiety disorder: Secondary | ICD-10-CM | POA: Diagnosis not present

## 2024-01-30 DIAGNOSIS — F411 Generalized anxiety disorder: Secondary | ICD-10-CM | POA: Diagnosis not present

## 2024-01-31 DIAGNOSIS — M170 Bilateral primary osteoarthritis of knee: Secondary | ICD-10-CM | POA: Diagnosis not present

## 2024-02-06 DIAGNOSIS — F411 Generalized anxiety disorder: Secondary | ICD-10-CM | POA: Diagnosis not present

## 2024-02-13 DIAGNOSIS — F411 Generalized anxiety disorder: Secondary | ICD-10-CM | POA: Diagnosis not present

## 2024-02-20 ENCOUNTER — Other Ambulatory Visit: Payer: Self-pay | Admitting: Gastroenterology

## 2024-02-21 ENCOUNTER — Other Ambulatory Visit: Payer: Self-pay | Admitting: Gastroenterology

## 2024-02-27 DIAGNOSIS — F411 Generalized anxiety disorder: Secondary | ICD-10-CM | POA: Diagnosis not present

## 2024-03-08 DIAGNOSIS — F411 Generalized anxiety disorder: Secondary | ICD-10-CM | POA: Diagnosis not present

## 2024-03-15 DIAGNOSIS — F411 Generalized anxiety disorder: Secondary | ICD-10-CM | POA: Diagnosis not present

## 2024-03-19 DIAGNOSIS — F411 Generalized anxiety disorder: Secondary | ICD-10-CM | POA: Diagnosis not present

## 2024-03-26 DIAGNOSIS — F411 Generalized anxiety disorder: Secondary | ICD-10-CM | POA: Diagnosis not present

## 2024-04-03 ENCOUNTER — Encounter (HOSPITAL_COMMUNITY): Payer: Self-pay | Admitting: Gastroenterology

## 2024-04-08 DIAGNOSIS — F411 Generalized anxiety disorder: Secondary | ICD-10-CM | POA: Diagnosis not present

## 2024-04-09 ENCOUNTER — Ambulatory Visit (HOSPITAL_COMMUNITY)
Admission: RE | Admit: 2024-04-09 | Discharge: 2024-04-09 | Disposition: A | Discharge status: Home / Self Care | Attending: Gastroenterology | Admitting: Gastroenterology

## 2024-04-09 ENCOUNTER — Ambulatory Visit (HOSPITAL_COMMUNITY): Admitting: Anesthesiology

## 2024-04-09 ENCOUNTER — Encounter (HOSPITAL_COMMUNITY): Payer: Self-pay | Admitting: Gastroenterology

## 2024-04-09 ENCOUNTER — Encounter (HOSPITAL_COMMUNITY)
Admission: RE | Disposition: A | Discharge status: Home / Self Care | Payer: Self-pay | Source: Home / Self Care | Attending: Gastroenterology

## 2024-04-09 ENCOUNTER — Other Ambulatory Visit: Payer: Self-pay

## 2024-04-09 DIAGNOSIS — Z6841 Body Mass Index (BMI) 40.0 and over, adult: Secondary | ICD-10-CM | POA: Diagnosis not present

## 2024-04-09 DIAGNOSIS — F419 Anxiety disorder, unspecified: Secondary | ICD-10-CM | POA: Diagnosis not present

## 2024-04-09 DIAGNOSIS — R195 Other fecal abnormalities: Secondary | ICD-10-CM | POA: Insufficient documentation

## 2024-04-09 DIAGNOSIS — Z9884 Bariatric surgery status: Secondary | ICD-10-CM | POA: Diagnosis not present

## 2024-04-09 DIAGNOSIS — Z79899 Other long term (current) drug therapy: Secondary | ICD-10-CM | POA: Diagnosis not present

## 2024-04-09 DIAGNOSIS — Z1211 Encounter for screening for malignant neoplasm of colon: Secondary | ICD-10-CM | POA: Diagnosis not present

## 2024-04-09 DIAGNOSIS — Z5309 Procedure and treatment not carried out because of other contraindication: Secondary | ICD-10-CM | POA: Diagnosis not present

## 2024-04-09 DIAGNOSIS — G4730 Sleep apnea, unspecified: Secondary | ICD-10-CM | POA: Insufficient documentation

## 2024-04-09 DIAGNOSIS — K5730 Diverticulosis of large intestine without perforation or abscess without bleeding: Secondary | ICD-10-CM | POA: Diagnosis not present

## 2024-04-09 DIAGNOSIS — K449 Diaphragmatic hernia without obstruction or gangrene: Secondary | ICD-10-CM | POA: Insufficient documentation

## 2024-04-09 DIAGNOSIS — Z7901 Long term (current) use of anticoagulants: Secondary | ICD-10-CM | POA: Diagnosis not present

## 2024-04-09 DIAGNOSIS — I10 Essential (primary) hypertension: Secondary | ICD-10-CM | POA: Insufficient documentation

## 2024-04-09 DIAGNOSIS — Z800 Family history of malignant neoplasm of digestive organs: Secondary | ICD-10-CM | POA: Diagnosis not present

## 2024-04-09 DIAGNOSIS — K439 Ventral hernia without obstruction or gangrene: Secondary | ICD-10-CM | POA: Diagnosis not present

## 2024-04-09 DIAGNOSIS — R519 Headache, unspecified: Secondary | ICD-10-CM | POA: Diagnosis not present

## 2024-04-09 DIAGNOSIS — M1990 Unspecified osteoarthritis, unspecified site: Secondary | ICD-10-CM | POA: Diagnosis not present

## 2024-04-09 HISTORY — PX: COLONOSCOPY: SHX5424

## 2024-04-09 SURGERY — COLONOSCOPY
Anesthesia: Monitor Anesthesia Care | Laterality: Bilateral

## 2024-04-09 MED ORDER — PROPOFOL 500 MG/50ML IV EMUL
INTRAVENOUS | Status: DC | PRN
  Administered 2024-04-09: 120 ug/kg/min via INTRAVENOUS

## 2024-04-09 MED ORDER — SODIUM CHLORIDE 0.9 % IV SOLN: INTRAVENOUS | Status: DC | PRN

## 2024-04-09 MED ORDER — PROPOFOL 1000 MG/100ML IV EMUL
INTRAVENOUS | Status: AC
Start: 2024-04-09 — End: ?
  Filled 2024-04-09: qty 100

## 2024-04-09 MED ORDER — PROPOFOL 10 MG/ML IV BOLUS
INTRAVENOUS | Status: DC | PRN
  Administered 2024-04-09: 50 mg via INTRAVENOUS

## 2024-04-09 MED ORDER — SODIUM CHLORIDE 0.9 % IV SOLN
INTRAVENOUS | Status: AC | PRN
  Administered 2024-04-09: 500 mL via INTRAVENOUS

## 2024-04-09 MED ORDER — LIDOCAINE HCL (CARDIAC) PF 100 MG/5ML IV SOSY
PREFILLED_SYRINGE | INTRAVENOUS | Status: DC | PRN
  Administered 2024-04-09: 100 mg via INTRATRACHEAL

## 2024-04-09 MED ORDER — PHENYLEPHRINE 80 MCG/ML (10ML) SYRINGE FOR IV PUSH (FOR BLOOD PRESSURE SUPPORT)
PREFILLED_SYRINGE | INTRAVENOUS | Status: DC | PRN
  Administered 2024-04-09 (×5): 160 ug via INTRAVENOUS

## 2024-04-09 NOTE — H&P (Signed)
 [facility] H/P Note  Chief Complaint: cologuard positive  HPI: [name] is an 58 y.o. female.  Cologuard positive.  No GI symptoms.  No prior colonoscopy.    Past Medical History:  Diagnosis Date   Anemia    Anxiety    Aortic stenosis 11/04/2021   Arthritis    knees    Atrial fibrillation (HCC)    Headache    hx of migraines    History of hiatal hernia    History of kidney stones    Hypertension    LVH (left ventricular hypertrophy) due to hypertensive disease 11/2022   Morbid obesity with BMI of 60.0-69.9, adult (HCC)    BMI 62   Murmur 08/15/2017   Pneumonia 07/18/2011   hx of pneumonia after gastric bypass in 2015   Preoperative evaluation of a medical condition to rule out surgical contraindications (TAR required) 11/04/2021   Sleep apnea    cpap    Past Surgical History:  Procedure Laterality Date   APPENDECTOMY     BREAST BIOPSY Left 09/14/2020   BREAST BIOPSY Right 10/14/2022   US  RT BREAST BX W LOC DEV 1ST LESION IMG BX SPEC US  GUIDE 10/14/2022 [facility]   CARDIOVERSION N/A 01/25/2023   Procedure: CARDIOVERSION;  Surgeon: Hugh Madura, MD;  Location: [facility];  Service: Cardiovascular;  Laterality: N/A;   CARPAL TUNNEL RELEASE Right 11/23/2021   Procedure: CARPAL TUNNEL RELEASE;  Surgeon: Marlena Sima, MD;  Location: [facility];  Service: Orthopedics;  Laterality: Right;   CESAREAN SECTION     CHOLECYSTECTOMY     CYSTOSCOPY/URETEROSCOPY/HOLMIUM LASER/STENT PLACEMENT Bilateral 04/11/2023   Procedure: DIAGNOSTIC CYSTOSCOPY; LEFT URETEROSCOPY AND HOLMIUM LASER LITHOTRIPSY; BILATERAL URETERAL STENT PLACEMENT;  Surgeon: Roxane Copp, MD;  Location: [facility];  Service: Urology;  Laterality: Bilateral;  90 MINUTES   CYSTOSCOPY/URETEROSCOPY/HOLMIUM LASER/STENT PLACEMENT Bilateral 04/25/2023   Procedure: DIAGNOSTIC CYSTOSCOPY, RIGHT URETEROSCOPY, HOLMIUM LASER LITHOTRIPSY, AND RIGHT URETERAL STENT EXCHANGE, LEFT STENT REMOVAL;  Surgeon:  Roxane Copp, MD;  Location: [facility];  Service: Urology;  Laterality: Bilateral;  90 MINUTES   LAPAROSCOPIC ROUX-EN-Y GASTRIC BYPASS WITH HIATAL HERNIA REPAIR N/A 12/30/2014   Procedure: LAPAROSCOPIC ROUX-EN-Y GASTRIC BYPASS WITH HIATAL HERNIA REPAIR AND UPPER ENDOSCOPY;  Surgeon: Fran Imus, MD;  Location: [facility];  Service: General;  Laterality: N/A;   TONSILLECTOMY      Medications Prior to Admission  Medication Sig Dispense Refill   apixaban  (ELIQUIS ) 5 MG TABS tablet TAKE 1 TABLET(5 MG) BY MOUTH TWICE DAILY 180 tablet 1   Biotin 5 MG CAPS Take 5 mg by mouth daily.     Cyanocobalamin (VITAMIN B-12) 5000 MCG TBDP Take 5,000 mcg by mouth in the morning.     diphenhydramine-acetaminophen  (TYLENOL  PM) 25-500 MG TABS Take 2 tablets by mouth at bedtime.     FLUoxetine  (PROZAC ) 20 MG capsule Take 3 capsules (60 mg total) by mouth daily. 270 capsule 3   LORazepam  (ATIVAN ) 0.5 MG tablet TAKE 1 TABLET(0.5 MG) BY MOUTH EVERY 8 HOURS AS NEEDED FOR ANXIETY 30 tablet 2   metoprolol  tartrate (LOPRESSOR ) 50 MG tablet TAKE 1 TABLET(50 MG) BY MOUTH TWICE DAILY 180 tablet 3   Multiple Vitamin (MULTIVITAMIN WITH MINERALS) TABS tablet Take 1 tablet by mouth in the morning and at bedtime.     NORLYDA 0.35 MG tablet Take 0.35 mg by mouth every evening.     triamterene -hydrochlorothiazide (MAXZIDE-25) 37.5-25 MG tablet TAKE 1 TABLET BY MOUTH DAILY 90 tablet 1  Allergies:  Allergies  Allergen Reactions   Darvocet [Propoxyphene N-Acetaminophen ] Rash   Penicillins Swelling    Eye swelling    Family History  Problem Relation Age of Onset   Arrhythmia Mother        atrial fibrillation   Hypertension Mother    Arrhythmia Father        atrial fibrillation   Hypertension Father    Cancer Father        prostate   Diabetes Father    Colon cancer Father    Kidney Stones Father    Hypertension Brother    Diabetes Paternal Aunt    Stroke Paternal Aunt    Mental illness Paternal Aunt    Diabetes  Paternal Uncle    Breast cancer Maternal Grandmother    Heart disease Maternal Grandfather    Heart attack Maternal Grandfather    Cancer Paternal Grandmother        liver    Social History:  reports that she has never smoked. She has never used smokeless tobacco. She reports that she does not drink alcohol and does not use drugs.   ROS:As per HPI, all others negative   Blood pressure (!) 177/65, pulse 80, temperature 98.7 F (37.1 C), temperature source Temporal, resp. rate 20, height 5\' 9"  (1.753 m), weight (!) 182.3 kg, SpO2 97%. General appearance: overweight, nad HEENT:  Thick, supple CV:  Irregularly irregular LUNG:  No visible distress ABD:  Soft, non-tender NEURO:  No encephalopathy  No results found for this or any previous visit (from the past 48 hours). No results found.  Assessment/Plan   Cologuard positive. Eliquis  on hold. Colonoscopy today. Risks (bleeding, infection, bowel perforation that could require surgery, sedation-related changes in cardiopulmonary systems), benefits (identification and possible treatment of source of symptoms, exclusion of certain causes of symptoms), and alternatives (watchful waiting, radiographic imaging studies, empiric medical treatment) of colonoscopy were explained to patient/family in detail and patient wishes to proceed.   [name] 04/09/2024, 8:28 AM

## 2024-04-09 NOTE — Anesthesia Preprocedure Evaluation (Signed)
 Anesthesia Evaluation  Patient identified by MRN, date of birth, ID band Patient awake    Reviewed: Allergy & Precautions, H&P , NPO status , Patient's Chart, lab work & pertinent test results  Airway Mallampati: II   Neck ROM: full    Dental   Pulmonary sleep apnea    breath sounds clear to auscultation       Cardiovascular hypertension, + Valvular Problems/Murmurs AS  Rhythm:regular Rate:Normal  TTE (11/2023): EF 70%, mild AS with mean PG 17 mmHg.   Neuro/Psych  Headaches PSYCHIATRIC DISORDERS Anxiety        GI/Hepatic hiatal hernia,,,  Endo/Other    Renal/GU      Musculoskeletal  (+) Arthritis ,    Abdominal   Peds  Hematology   Anesthesia Other Findings   Reproductive/Obstetrics                             Anesthesia Physical Anesthesia Plan  ASA: 3  Anesthesia Plan: MAC   Post-op Pain Management:    Induction: Intravenous  PONV Risk Score and Plan: 2 and Propofol  infusion and Treatment may vary due to age or medical condition  Airway Management Planned: Nasal Cannula  Additional Equipment:   Intra-op Plan:   Post-operative Plan:   Informed Consent: I have reviewed the patients History and Physical, chart, labs and discussed the procedure including the risks, benefits and alternatives for the proposed anesthesia with the patient or authorized representative who has indicated his/her understanding and acceptance.     Dental advisory given  Plan Discussed with: CRNA, Anesthesiologist and Surgeon  Anesthesia Plan Comments:        Anesthesia Quick Evaluation

## 2024-04-09 NOTE — Discharge Instructions (Signed)
 YOU HAD AN ENDOSCOPIC PROCEDURE TODAY: Refer to the procedure report and other information in the discharge instructions given to you for any specific questions about what was found during the examination. If this information does not answer your questions, please call the [facility] office at [phone] to clarify.   YOU SHOULD EXPECT: Some feelings of bloating in the abdomen. Passage of more gas than usual. Walking can help get rid of the air that was put into your GI tract during the procedure and reduce the bloating. If you had a lower endoscopy (such as a colonoscopy or flexible sigmoidoscopy) you may notice spotting of blood in your stool or on the toilet paper. Some abdominal soreness may be present for a day or two, also.  DIET: Your first meal following the procedure should be a light meal and then it is ok to progress to your normal diet. A half-sandwich or bowl of soup is an example of a good first meal. Heavy or fried foods are harder to digest and may make you feel nauseous or bloated. Drink plenty of fluids but you should avoid alcoholic beverages for 24 hours.    ACTIVITY: Your care partner should take you home directly after the procedure. You should plan to take it easy, moving slowly for the rest of the day. You can resume normal activity the day after the procedure however YOU SHOULD NOT DRIVE, use power tools, machinery or perform tasks that involve climbing or major physical exertion for 24 hours (because of the sedation medicines used during the test).   SYMPTOMS TO REPORT IMMEDIATELY: A gastroenterologist can be reached at any hour. Please call [phone]  for any of the following symptoms:  Following lower endoscopy (colonoscopy, flexible sigmoidoscopy) Excessive amounts of blood in the stool  Significant tenderness, worsening of abdominal pains  Swelling of the abdomen that is new, acute  Fever of 100 or higher    FOLLOW UP:  If any biopsies were taken you will be  contacted by phone or by letter within the next 1-3 weeks. Call [phone]  if you have not heard about the biopsies in 3 weeks.  Please also call with any specific questions about appointments or follow up tests.

## 2024-04-09 NOTE — Op Note (Signed)
 [facility] Patient Name: [name] Procedure Date: 04/09/2024 MRN: [id number] Attending MD: Evangeline Hilts , MD, [id number] Date of Birth: [birth year] CSN: [id number] Age: 58 Admit Type: Outpatient Procedure:                Colonoscopy Indications:              This is the patient's first colonoscopy, Positive                            Cologuard test Providers:                Evangeline Hilts, MD, Lonzell Robin, RN, Kimberly Penna                            [place], Technician Referring MD:              Medicines:                Monitored Anesthesia Care Complications:            No immediate complications. Estimated Blood Loss:     Estimated blood loss: none. Procedure:                Pre-Anesthesia Assessment:                           - Prior to the procedure, a History and Physical                            was performed, and patient medications and                            allergies were reviewed. The patient's tolerance of                            previous anesthesia was also reviewed. The risks                            and benefits of the procedure and the sedation                            options and risks were discussed with the patient.                            All questions were answered, and informed consent                            was obtained. Prior Anticoagulants: The patient has                            taken Eliquis  (apixaban ), last dose was 3 days                            prior to procedure. ASA Grade Assessment: III - A  patient with severe systemic disease. After                            reviewing the risks and benefits, the patient was                            deemed in satisfactory condition to undergo the                            procedure.                           After obtaining informed consent, the colonoscope                            was passed under direct vision. Throughout the                             procedure, the patient's blood pressure, pulse, and                            oxygen saturations were monitored continuously. The                            CF-HQ190L ([id number]) Olympus colonoscope was                            introduced through the anus with the intention of                            advancing to the cecum. The scope was advanced to                            the transverse colon before the procedure was                            aborted. Medications were given. The rectum was                            photographed. The colonoscopy was performed with                            difficulty. The patient tolerated the procedure                            well. The quality of the bowel preparation was                            adequate. Scope In: 8:47:16 AM Scope Out: 9:02:13 AM Total Procedure Duration: 0 hours 14 minutes 57 seconds  Findings:      The perianal and digital rectal examinations were normal.      A few medium-mouthed diverticula were found in the sigmoid colon and       descending colon.      Patient has  ventral abdominal hernia on prior imaging, containing part       of transverse colon. I was unable to pass the colonoscope proximal to       distal transverse colon, despite attempts in change in patient       positioning and multiple different regions of external abdominal       pressure. I elected to abort procedure.      The retroflexed view of the distal rectum and anal verge was normal and       showed no anal or rectal abnormalities. Impression:               - Diverticulosis in the sigmoid colon and in the                            descending colon.                           - The distal rectum and anal verge are normal on                            retroflexion view.                           - Incomplete colonoscopy as described above;                            today's examination NOT adequate for colonic                             evaluation. Moderate Sedation:      None Recommendation:           - Patient has a contact number available for                            emergencies. The signs and symptoms of potential                            delayed complications were discussed with the                            patient. Return to normal activities tomorrow.                            Written discharge instructions were provided to the                            patient.                           - Discharge patient to home (via wheelchair).                           - Soft diet today.                           - Continue present medications.                           -  Resume Eliquis  (apixaban ) at prior dose today.                           - Perform a virtual colonoscopy at appointment to                            be scheduled. Procedure Code(s):        --- Professional ---                           [phone], 53, Colonoscopy, flexible; diagnostic,                            including collection of specimen(s) by brushing or                            washing, when performed (separate procedure) Diagnosis Code(s):        --- Professional ---                           R19.5, Other fecal abnormalities                           K57.30, Diverticulosis of large intestine without                            perforation or abscess without bleeding CPT copyright 2022 American [employer] Association. All rights reserved. The codes documented in this report are preliminary and upon coder review may  be revised to meet current compliance requirements. Evangeline Hilts, MD 04/09/2024 9:09:15 AM This report has been signed electronically. Number of Addenda: 0

## 2024-04-09 NOTE — Transfer of Care (Signed)
 Immediate Anesthesia Transfer of Care Note  Patient: [name]  Procedure(s) Performed: COLONOSCOPY (Bilateral)  Patient Location: PACU  Anesthesia Type:MAC  Level of Consciousness: awake and alert   Airway & Oxygen Therapy: Patient Spontanous Breathing and Patient connected to face mask oxygen  Post-op Assessment: Report given to RN and Post -op Vital signs reviewed and stable  Post vital signs: Reviewed and stable  Last Vitals:  Vitals Value Taken Time  BP 113/46 04/09/24 0907  Temp    Pulse 81 04/09/24 0908  Resp 27 04/09/24 0908  SpO2 100 % 04/09/24 0908  Vitals shown include unfiled device data.  Last Pain:  Vitals:   04/09/24 0728  TempSrc: Temporal  PainSc: 0-No pain         Complications: No notable events documented.

## 2024-04-10 ENCOUNTER — Other Ambulatory Visit: Payer: Self-pay | Admitting: Gastroenterology

## 2024-04-10 DIAGNOSIS — R195 Other fecal abnormalities: Secondary | ICD-10-CM

## 2024-04-10 DIAGNOSIS — K432 Incisional hernia without obstruction or gangrene: Secondary | ICD-10-CM

## 2024-04-10 NOTE — Anesthesia Postprocedure Evaluation (Signed)
 Anesthesia Post Note  Patient: [name]  Procedure(s) Performed: COLONOSCOPY (Bilateral)     Patient location during evaluation: Endoscopy Anesthesia Type: MAC Level of consciousness: awake and alert Pain management: pain level controlled Vital Signs Assessment: post-procedure vital signs reviewed and stable Respiratory status: spontaneous breathing, nonlabored ventilation, respiratory function stable and patient connected to nasal cannula oxygen Cardiovascular status: stable and blood pressure returned to baseline Postop Assessment: no apparent nausea or vomiting Anesthetic complications: no   No notable events documented.  Last Vitals:  Vitals:   04/09/24 0910 04/09/24 0920  BP: 104/68 (!) 116/51  Pulse: 71 79  Resp: (!) 30 (!) 23  Temp:    SpO2: 100% 96%    Last Pain:  Vitals:   04/09/24 0920  TempSrc:   PainSc: 0-No pain                 Harshaan Whang S

## 2024-04-11 ENCOUNTER — Encounter (HOSPITAL_COMMUNITY): Payer: Self-pay | Admitting: Gastroenterology

## 2024-04-15 DIAGNOSIS — G4733 Obstructive sleep apnea (adult) (pediatric): Secondary | ICD-10-CM | POA: Diagnosis not present

## 2024-04-16 ENCOUNTER — Other Ambulatory Visit: Payer: Self-pay | Admitting: Gastroenterology

## 2024-04-16 DIAGNOSIS — R195 Other fecal abnormalities: Secondary | ICD-10-CM

## 2024-04-16 DIAGNOSIS — K436 Other and unspecified ventral hernia with obstruction, without gangrene: Secondary | ICD-10-CM

## 2024-04-18 DIAGNOSIS — F411 Generalized anxiety disorder: Secondary | ICD-10-CM | POA: Diagnosis not present

## 2024-04-24 DIAGNOSIS — F411 Generalized anxiety disorder: Secondary | ICD-10-CM | POA: Diagnosis not present

## 2024-04-30 ENCOUNTER — Encounter: Payer: Self-pay | Admitting: Psychiatry

## 2024-04-30 ENCOUNTER — Telehealth: Admitting: Psychiatry

## 2024-04-30 DIAGNOSIS — F338 Other recurrent depressive disorders: Secondary | ICD-10-CM

## 2024-04-30 DIAGNOSIS — F422 Mixed obsessional thoughts and acts: Secondary | ICD-10-CM

## 2024-04-30 DIAGNOSIS — G4733 Obstructive sleep apnea (adult) (pediatric): Secondary | ICD-10-CM

## 2024-04-30 DIAGNOSIS — F411 Generalized anxiety disorder: Secondary | ICD-10-CM | POA: Diagnosis not present

## 2024-04-30 MED ORDER — LORAZEPAM 0.5 MG PO TABS: 0.5000 mg | ORAL_TABLET | Freq: Two times a day (BID) | ORAL | 2 refills | Status: DC

## 2024-04-30 NOTE — Progress Notes (Signed)
 [name] [id number] December 14, 1965 58 y.o.  Video Visit via My Chart  I connected with pt by My Chart and verified that I am speaking with the correct person using two identifiers.   I discussed the limitations, risks, security and privacy concerns of performing an evaluation and management service by My Chart  and the availability of in person appointments. I also discussed with the patient that there may be a patient responsible charge related to this service. The patient expressed understanding and agreed to proceed.  I discussed the assessment and treatment plan with the patient. The patient was provided an opportunity to ask questions and all were answered. The patient agreed with the plan and demonstrated an understanding of the instructions.   The patient was advised to call back or seek an in-person evaluation if the symptoms worsen or if the condition fails to improve as anticipated.  I provided 15 minutes of video time during this encounter.  The patient was located at home and the provider was located office. Session from 1000-1030  Subjective:   Patient ID:  [name] is a 58 y.o. (DOB 10/16/1966) female.  Chief Complaint:  Chief Complaint  Patient presents with   Follow-up   Anxiety   Depression  FU anxiety and mood.   HPI  [name] presents to the office today for follow-up of depression with seasonality and anxiety.     seen July 2020.  No meds were changed.  04/10/20 appt.  The following noted: Teaches online.  Difficult during Covid.  Vaccinated which helped stress over the virus.  Able to get out more.  Less anxiety.   Difficult to stay home all these months.  Good vacation helped.  Mood generally OK but Covid hard and stress of school uncertainties.  Before vacation hard to get OOB but better since then.  Not really obsessing.  Works from home.  That helps keep her on track and schedule. No worsening OCD but anxiety with Covid is some  better.  Last summer hard to get OOB but OK now. Ok this winter so far.  Vitamin D  helped. Likes benefit lorazepam .  Only used rarely.  Otherwise usually ok. Plan: Continue fluoxetine  60 mg daily  12/22/2021 appt noted: Use lorazepam  just as needed for anxiety and not often. Remains on fluoxetine  60 mg daily.  No SE Good not much obsessing unless really tired.   Stressful season bc H working a lot so everything falls to her.  Coping well. Not much seasonal depression.  Taking vitamin D  in fall winter. D [place] grad HS this year and going to [employer] Plan: no med changes  06/23/22 appt noted: Consistent with fluoxetine  60. No SE Use lorazepam  prn when visits family to keep from panic.  Not panic lately.  Anxiety is good. 3 weeks away H retiring and D going off to college.  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 Minimal obsessing unless really tired.  Under control.   Patient reports stable mood and denies depressed or irritable moods.  Patient denies any recent difficulty with anxiety.  Patient denies difficulty with sleep initiation or maintenance. Denies appetite disturbance.  Patient reports that energy and motivation have been good.  Patient denies any difficulty with concentration.  Patient denies any suicidal ideation. Health stable.  Works from home. Plan: No change indicated.  Continue fluoxetine  60 mg daily Rare lorzepam prn  12/29/22 appt noted: Good overall.  Needed more lorazepam  bc need more  at [facility].  But not this month as much.  When with family needed more. 2 weeks ago dx afib and changed meds to metoprolol . Afib did not cause sx.   Anxiety ok now.  OCD is controlled.  No sig dep.  Sleep is good and no problems with meds. [place] adjusted to school at college.   Plan: no changes  07/06/23 appt noted: Meds as above rare lorazepam  like with medical procedures and it helped. Dx afib on Eloquis.   But also severe OSA started CPAP May nightly  5-6 hours+. AHI 32 and treated.  Feels more alert during the day.  Dream more.  Restorative sleep and better energy. Mood and anxiety good.  OCD managed.  Minimal unless overly tired.  Mentally fine.   No SE with meds.   Still working.  H retired from [employer] last year.  Will work another couple of years. D will start 2nd year college this year. Plan no changes  01/17/24 appt noted: Meds: Fluoxetine  60, lorazepam  0.5 every 8 hours as needed Only use lorazepam  around her family. Sleep is good.  Using CPAP and 100% better sleep.  Didn't realize how bad it was until using CPAP machine.     Mood and anxiety good.  OCD managed.  Less checking and mentally better with better sleep.  Anxiety ok. A  lot going on with work changes.  Nothing major otherwise. D settled into [place] and doing well.  [place] 2 more years of school. No concerns with meds.  04/30/24 appt noted: Meds: Fluoxetine  60, lorazepam  0.5 every 8 hours as needed- avg once weekly No SE Moved appt up bc px.  Resumed counseling.  New bad boss.  M stopped talking to her.  A few health issues nothing serious.   Since March mor e anxiety and overwhelmed if have to go out and talk to people and avoiding church.  Hard time getting out of bed.  Difficult coping. Not obsessing. General overwhelmed and heaviness.  Can't deal with much.  Lorazepam  helps.   No new meds.    Past Psychiatric Medication Trials:' no others. Fluoxetine  60 lorazepam  Under the care of [facility] for many many years.  Review of Systems:  Review of Systems  Constitutional:  Negative for fatigue.  Respiratory:  Negative for shortness of breath.   Cardiovascular:  Negative for palpitations.  Musculoskeletal:  Positive for arthralgias, back pain and gait problem.  Neurological:  Negative for tremors.  Psychiatric/Behavioral:  Negative for agitation, behavioral problems, confusion, decreased concentration, dysphoric mood, hallucinations,  self-injury, sleep disturbance and suicidal ideas. The patient is nervous/anxious. The patient is not hyperactive.     Medications: I have reviewed the patient's current medications.  Current Outpatient Medications  Medication Sig Dispense Refill   apixaban  (ELIQUIS ) 5 MG TABS tablet TAKE 1 TABLET(5 MG) BY MOUTH TWICE DAILY 180 tablet 1   Biotin 5 MG CAPS Take 5 mg by mouth daily.     Cyanocobalamin (VITAMIN B-12) 5000 MCG TBDP Take 5,000 mcg by mouth in the morning.     diphenhydramine-acetaminophen  (TYLENOL  PM) 25-500 MG TABS Take 2 tablets by mouth at bedtime.     FLUoxetine  (PROZAC ) 20 MG capsule Take 3 capsules (60 mg total) by mouth daily. 270 capsule 3   metoprolol  tartrate (LOPRESSOR ) 50 MG tablet TAKE 1 TABLET(50 MG) BY MOUTH TWICE DAILY 180 tablet 3   Multiple Vitamin (MULTIVITAMIN WITH MINERALS) TABS tablet Take 1 tablet by mouth in the morning and at  bedtime.     NORLYDA 0.35 MG tablet Take 0.35 mg by mouth every evening.     triamterene -hydrochlorothiazide (MAXZIDE-25) 37.5-25 MG tablet TAKE 1 TABLET BY MOUTH DAILY 90 tablet 1   LORazepam  (ATIVAN ) 0.5 MG tablet Take 1 tablet (0.5 mg total) by mouth 2 (two) times daily. 60 tablet 2   No current facility-administered medications for this visit.    Medication Side Effects: None  Allergies:  Allergies  Allergen Reactions   Darvocet [Propoxyphene N-Acetaminophen ] Rash   Penicillins Swelling    Eye swelling    Past Medical History:  Diagnosis Date   Anemia    Anxiety    Aortic stenosis 11/04/2021   Arthritis    knees    Atrial fibrillation (HCC)    Headache    hx of migraines    History of hiatal hernia    History of kidney stones    Hypertension    LVH (left ventricular hypertrophy) due to hypertensive disease 11/2022   Morbid obesity with BMI of 60.0-69.9, adult (HCC)    BMI 62   Murmur 08/15/2017   Pneumonia 07/18/2011   hx of pneumonia after gastric bypass in 2015   Preoperative evaluation of a medical  condition to rule out surgical contraindications (TAR required) 11/04/2021   Sleep apnea    cpap    Family History  Problem Relation Age of Onset   Arrhythmia Mother        atrial fibrillation   Hypertension Mother    Arrhythmia Father        atrial fibrillation   Hypertension Father    Cancer Father        prostate   Diabetes Father    Colon cancer Father    Kidney Stones Father    Hypertension Brother    Diabetes Paternal Aunt    Stroke Paternal Aunt    Mental illness Paternal Aunt    Diabetes Paternal Uncle    Breast cancer Maternal Grandmother    Heart disease Maternal Grandfather    Heart attack Maternal Grandfather    Cancer Paternal Grandmother        liver    Social History   Socioeconomic History   Marital status: Married    Spouse name: Not on file   Number of children: Not on file   Years of education: Not on file   Highest education level: Not on file  Occupational History   Not on file  Tobacco Use   Smoking status: Never   Smokeless tobacco: Never  Vaping Use   Vaping status: Never Used  Substance and Sexual Activity   Alcohol use: No   Drug use: No   Sexual activity: Yes    Birth control/protection: Pill    Comment: azurette   Other Topics Concern   Not on file  Social History Narrative   Not on file   Social Drivers of Health   Financial Resource Strain: Not on file  Food Insecurity: Not on file  Transportation Needs: Not on file  Physical Activity: Not on file  Stress: Not on file  Social Connections: Not on file  Intimate Partner Violence: Not on file    Past Medical History, Surgical history, Social history, and Family history were reviewed and updated as appropriate.   Please see review of systems for further details on the patient's review from today.   Objective:   Physical Exam:  There were no vitals taken for this visit.  Physical Exam Neurological:  Mental Status: She is alert and oriented to person, place, and  time.     Cranial Nerves: No dysarthria.  Psychiatric:        Attention and Perception: Attention and perception normal.        Mood and Affect: Mood is anxious. Mood is not depressed.        Speech: Speech normal.        Behavior: Behavior is cooperative.        Thought Content: Thought content normal. Thought content is not paranoid or delusional. Thought content does not include homicidal or suicidal ideation. Thought content does not include suicidal plan.        Cognition and Memory: Cognition and memory normal.        Judgment: Judgment normal.     Comments: Insight intact Minimal obsessions     Lab Review:     Component Value Date/Time   NA 140 04/25/2023 0747   NA 140 01/16/2023 0951   K 3.4 (L) 04/25/2023 0747   CL 104 04/25/2023 0747   CO2 26 04/25/2023 0747   GLUCOSE 100 (H) 04/25/2023 0747   BUN 17 04/25/2023 0747   BUN 21 01/16/2023 0951   CREATININE 0.92 04/25/2023 0747   CREATININE 0.57 08/25/2014 0836   CALCIUM 8.7 (L) 04/25/2023 0747   PROT 6.3 (L) 11/17/2021 1053   PROT 6.5 12/11/2020 1122   ALBUMIN 3.6 11/17/2021 1053   ALBUMIN 4.2 12/11/2020 1122   AST 19 11/17/2021 1053   ALT 17 11/17/2021 1053   ALKPHOS 87 11/17/2021 1053   BILITOT 1.0 11/17/2021 1053   BILITOT 0.7 12/11/2020 1122   GFRNONAA >60 04/25/2023 0747   GFRAA 95 12/11/2020 1122       Component Value Date/Time   WBC 11.9 (H) 04/25/2023 0747   RBC 4.79 04/25/2023 0747   HGB 13.8 04/25/2023 0747   HGB 15.0 01/16/2023 0951   HGB 13.1 06/05/2014 0920   HCT 44.3 04/25/2023 0747   HCT 47.4 (H) 01/16/2023 0951   HCT 41.1 06/05/2014 0920   PLT 311 04/25/2023 0747   PLT 341 01/16/2023 0951   MCV 92.5 04/25/2023 0747   MCV 89 01/16/2023 0951   MCV 89.2 06/05/2014 0920   MCH 28.8 04/25/2023 0747   MCHC 31.2 04/25/2023 0747   RDW 14.9 04/25/2023 0747   RDW 13.3 01/16/2023 0951   RDW 14.3 06/05/2014 0920   LYMPHSABS 1.4 01/01/2015 0621   LYMPHSABS 1.9 06/05/2014 0920   MONOABS 1.6 (H)  01/01/2015 0621   MONOABS 0.9 06/05/2014 0920   EOSABS 0.1 01/01/2015 0621   EOSABS 0.2 06/05/2014 0920   BASOSABS 0.0 01/01/2015 0621   BASOSABS 0.1 06/05/2014 0920    No results found for: "POCLITH", "LITHIUM"   No results found for: "PHENYTOIN", "PHENOBARB", "VALPROATE", "CBMZ"   .res Assessment: Plan:    [name] was seen today for follow-up, anxiety and depression.  Diagnoses and all orders for this visit:  Mixed obsessional thoughts and acts -     LORazepam  (ATIVAN ) 0.5 MG tablet; Take 1 tablet (0.5 mg total) by mouth 2 (two) times daily.  Generalized anxiety disorder  Seasonal depression (HCC)  Severe obstructive sleep apnea   30 min video face to face time with patient was spent on counseling and coordination of care. We discussed OCD manageable.  Not much depression at present.  Tolerating meds.  Rare BZ. High relapse risk in OCD without SSRI .  She agrees to continue  Anxiety is much worse.  increase fluoxetine  80 mg daily Use more  lorazepam  0.5 mg AM  and prn  Consider buspirone or Abilify for worsening anxiety.  We discussed the short-term risks associated with benzodiazepines including sedation and increased fall risk among others.  Discussed long-term side effect risk including dependence, potential withdrawal symptoms, and the potential eventual dose-related risk of dementia.  But recent studies from 2020 dispute this association between benzodiazepines and dementia risk. Newer studies in 2020 do not support an association with dementia.  Extensive discussion of importance of CPAP for brain health and OSA noted.  She is compliant and benefiting.  Continued counseling.  H retired and a bit of adjustment for her.  Been about 18 mos now.  FU 1 mos  Nori Beat, MD, DFAPA   Please see After Visit Summary for patient specific instructions.  Future Appointments  Date Time Provider Department Center  05/24/2024 10:00 AM [facility]     No orders of the defined types were placed in this encounter.     -------------------------------

## 2024-05-01 DIAGNOSIS — F411 Generalized anxiety disorder: Secondary | ICD-10-CM | POA: Diagnosis not present

## 2024-05-08 DIAGNOSIS — F411 Generalized anxiety disorder: Secondary | ICD-10-CM | POA: Diagnosis not present

## 2024-05-15 DIAGNOSIS — F411 Generalized anxiety disorder: Secondary | ICD-10-CM | POA: Diagnosis not present

## 2024-05-16 DIAGNOSIS — G4733 Obstructive sleep apnea (adult) (pediatric): Secondary | ICD-10-CM | POA: Diagnosis not present

## 2024-05-21 DIAGNOSIS — M170 Bilateral primary osteoarthritis of knee: Secondary | ICD-10-CM | POA: Diagnosis not present

## 2024-05-21 DIAGNOSIS — F411 Generalized anxiety disorder: Secondary | ICD-10-CM | POA: Diagnosis not present

## 2024-05-24 ENCOUNTER — Ambulatory Visit
Admission: RE | Admit: 2024-05-24 | Discharge: 2024-05-24 | Disposition: A | Discharge status: Home / Self Care | Source: Ambulatory Visit | Attending: Gastroenterology | Admitting: Gastroenterology

## 2024-05-24 DIAGNOSIS — R195 Other fecal abnormalities: Secondary | ICD-10-CM

## 2024-05-24 DIAGNOSIS — K429 Umbilical hernia without obstruction or gangrene: Secondary | ICD-10-CM | POA: Diagnosis not present

## 2024-05-24 DIAGNOSIS — K436 Other and unspecified ventral hernia with obstruction, without gangrene: Secondary | ICD-10-CM

## 2024-05-24 DIAGNOSIS — N132 Hydronephrosis with renal and ureteral calculous obstruction: Secondary | ICD-10-CM | POA: Diagnosis not present

## 2024-05-28 DIAGNOSIS — F411 Generalized anxiety disorder: Secondary | ICD-10-CM | POA: Diagnosis not present

## 2024-06-04 ENCOUNTER — Other Ambulatory Visit: Payer: Self-pay | Admitting: Cardiovascular Disease

## 2024-06-06 ENCOUNTER — Other Ambulatory Visit (HOSPITAL_BASED_OUTPATIENT_CLINIC_OR_DEPARTMENT_OTHER): Payer: Self-pay | Admitting: Cardiovascular Disease

## 2024-06-06 DIAGNOSIS — F411 Generalized anxiety disorder: Secondary | ICD-10-CM | POA: Diagnosis not present

## 2024-06-06 NOTE — Telephone Encounter (Signed)
 Prescription refill request for Eliquis  received. Indication:Afib  Last office visit: Scr: 0.92 (04/25/23)  Age: 58 Weight: 182.3kg  Labs overdue. Called PCP, left message for medical records.

## 2024-06-07 ENCOUNTER — Telehealth: Payer: Self-pay | Admitting: Cardiovascular Disease

## 2024-06-07 NOTE — Telephone Encounter (Signed)
 Pt called back and stated that she is going next week to her PCP's for a routine physical. Informed pt that we need blood work to make sure that she is on the correct dose of Eliquis . Pt stated that when she goes to her PCP next week she will be getting blood work. Informed pt that the lab work that we would be looking for is a BMET/CMET and CBC. Pt verbalized understanding.   Will go ahead and send in refill so pt does not run out. Pt stated that sending in a 3 month supply is cheaper for her so will go ahead and send in a 3 month supply.

## 2024-06-07 NOTE — Telephone Encounter (Signed)
 Patient states that she was returning your call. Please advise

## 2024-06-07 NOTE — Telephone Encounter (Addendum)
 No labs received from PCP. Called pt to make her aware of overdue labs and the need to come to our office for updated blood work, no answer. Left message on voicemail.

## 2024-06-07 NOTE — Telephone Encounter (Signed)
 Previous call yesterday was made to discuss Eliqus refill. Isaiah Cagey, RN spoke with pt and sent refill.

## 2024-06-13 DIAGNOSIS — F411 Generalized anxiety disorder: Secondary | ICD-10-CM | POA: Diagnosis not present

## 2024-06-14 DIAGNOSIS — F429 Obsessive-compulsive disorder, unspecified: Secondary | ICD-10-CM | POA: Diagnosis not present

## 2024-06-14 DIAGNOSIS — Z9884 Bariatric surgery status: Secondary | ICD-10-CM | POA: Diagnosis not present

## 2024-06-14 DIAGNOSIS — Z862 Personal history of diseases of the blood and blood-forming organs and certain disorders involving the immune mechanism: Secondary | ICD-10-CM | POA: Diagnosis not present

## 2024-06-14 DIAGNOSIS — I10 Essential (primary) hypertension: Secondary | ICD-10-CM | POA: Diagnosis not present

## 2024-06-15 DIAGNOSIS — G4733 Obstructive sleep apnea (adult) (pediatric): Secondary | ICD-10-CM | POA: Diagnosis not present

## 2024-06-18 DIAGNOSIS — G5602 Carpal tunnel syndrome, left upper limb: Secondary | ICD-10-CM | POA: Diagnosis not present

## 2024-06-20 DIAGNOSIS — F411 Generalized anxiety disorder: Secondary | ICD-10-CM | POA: Diagnosis not present

## 2024-06-26 ENCOUNTER — Telehealth: Admitting: Psychiatry

## 2024-06-26 ENCOUNTER — Encounter: Payer: Self-pay | Admitting: Psychiatry

## 2024-06-26 DIAGNOSIS — G4733 Obstructive sleep apnea (adult) (pediatric): Secondary | ICD-10-CM | POA: Diagnosis not present

## 2024-06-26 DIAGNOSIS — F422 Mixed obsessional thoughts and acts: Secondary | ICD-10-CM | POA: Diagnosis not present

## 2024-06-26 DIAGNOSIS — F411 Generalized anxiety disorder: Secondary | ICD-10-CM | POA: Diagnosis not present

## 2024-06-26 DIAGNOSIS — F338 Other recurrent depressive disorders: Secondary | ICD-10-CM | POA: Diagnosis not present

## 2024-06-26 MED ORDER — LORAZEPAM 0.5 MG PO TABS
0.5000 mg | ORAL_TABLET | Freq: Two times a day (BID) | ORAL | 5 refills | Status: DC
Start: 2024-06-26 — End: 2024-09-26

## 2024-06-26 MED ORDER — FLUOXETINE HCL 40 MG PO CAPS: 80.0000 mg | ORAL_CAPSULE | Freq: Every day | ORAL | 1 refills | Status: DC

## 2024-06-26 NOTE — Progress Notes (Signed)
 [name] [id number] [birth year] 58 y.o.  Video Visit via My Chart  I connected with pt by My Chart and verified that I am speaking with the correct person using two identifiers.   I discussed the limitations, risks, security and privacy concerns of performing an evaluation and management service by My Chart  and the availability of in person appointments. I also discussed with the patient that there may be a patient responsible charge related to this service. The patient expressed understanding and agreed to proceed.  I discussed the assessment and treatment plan with the patient. The patient was provided an opportunity to ask questions and all were answered. The patient agreed with the plan and demonstrated an understanding of the instructions.   The patient was advised to call back or seek an in-person evaluation if the symptoms worsen or if the condition fails to improve as anticipated.  I provided 15 minutes of video time during this encounter.  The patient was located at home and the provider was located office. Session from 1030-1100  Subjective:   Patient ID:  [name] is a 58 y.o. (DOB 19-Aug-1966) female.  Chief Complaint:  Chief Complaint  Patient presents with   Follow-up   Depression   Anxiety   Medication Problem  FU anxiety and mood.   HPI  [name] presents to the office today for follow-up of depression with seasonality and anxiety.     seen July 2020.  No meds were changed.  04/10/20 appt.  The following noted: Teaches online.  Difficult during Covid.  Vaccinated which helped stress over the virus.  Able to get out more.  Less anxiety.   Difficult to stay home all these months.  Good vacation helped.  Mood generally OK but Covid hard and stress of school uncertainties.  Before vacation hard to get OOB but better since then.  Not really obsessing.  Works from home.  That helps keep her on track and schedule. No worsening OCD but anxiety  with Covid is some better.  Last summer hard to get OOB but OK now. Ok this winter so far.  Vitamin D  helped. Likes benefit lorazepam .  Only used rarely.  Otherwise usually ok. Plan: Continue fluoxetine  60 mg daily  12/22/2021 appt noted: Use lorazepam  just as needed for anxiety and not often. Remains on fluoxetine  60 mg daily.  No SE Good not much obsessing unless really tired.   Stressful season bc H working a lot so everything falls to her.  Coping well. Not much seasonal depression.  Taking vitamin D  in fall winter. D [place] grad HS this year and going to [employer] Plan: no med changes  06/23/22 appt noted: Consistent with fluoxetine  60. No SE Use lorazepam  prn when visits family to keep from panic.  Not panic lately.  Anxiety is good. 3 weeks away H retiring and D going off to college.  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 Minimal obsessing unless really tired.  Under control.   Patient reports stable mood and denies depressed or irritable moods.  Patient denies any recent difficulty with anxiety.  Patient denies difficulty with sleep initiation or maintenance. Denies appetite disturbance.  Patient reports that energy and motivation have been good.  Patient denies any difficulty with concentration.  Patient denies any suicidal ideation. Health stable.  Works from home. Plan: No change indicated.  Continue fluoxetine  60 mg daily Rare lorzepam prn  12/29/22 appt noted: Good overall.  Needed more  lorazepam  bc need more at [facility].  But not this month as much.  When with family needed more. 2 weeks ago dx afib and changed meds to metoprolol . Afib did not cause sx.   Anxiety ok now.  OCD is controlled.  No sig dep.  Sleep is good and no problems with meds. [place] adjusted to school at college.   Plan: no changes  07/06/23 appt noted: Meds as above rare lorazepam  like with medical procedures and it helped. Dx afib on Eloquis.   But also severe OSA started  CPAP May nightly 5-6 hours+. AHI 32 and treated.  Feels more alert during the day.  Dream more.  Restorative sleep and better energy. Mood and anxiety good.  OCD managed.  Minimal unless overly tired.  Mentally fine.   No SE with meds.   Still working.  H retired from [employer] last year.  Will work another couple of years. D will start 2nd year college this year. Plan no changes  01/17/24 appt noted: Meds: Fluoxetine  60, lorazepam  0.5 every 8 hours as needed Only use lorazepam  around her family. Sleep is good.  Using CPAP and 100% better sleep.  Didn't realize how bad it was until using CPAP machine.     Mood and anxiety good.  OCD managed.  Less checking and mentally better with better sleep.  Anxiety ok. A  lot going on with work changes.  Nothing major otherwise. D settled into [place] and doing well.  [place] 2 more years of school. No concerns with meds.  04/30/24 appt noted: Meds: Fluoxetine  60, lorazepam  0.5 every 8 hours as needed- avg once weekly No SE Moved appt up bc px.  Resumed counseling.  New bad boss.  M stopped talking to her.  A few health issues nothing serious.   Since March mor e anxiety and overwhelmed if have to go out and talk to people and avoiding church.  Hard time getting out of bed.  Difficult coping. Not obsessing. General overwhelmed and heaviness.  Can't deal with much.  Lorazepam  helps.   No new meds.   Plan: increase fluoxetine  80 mg daily Use more  lorazepam  0.5 mg AM  and prn  06/26/24 appt noted:  Med: fluoxetine  80, lorazepam  0.5 mg AM & prn No SE. Feeling much better.  Getting out of house more.  Less down.  More productive.  Less heaviness. Counseling helped.   Boss in [place] is about the same and requiring OT.  Good colleagues.   Benefit meds.   Sleep is fine.     Past Psychiatric Medication Trials:' no others. Fluoxetine  60 lorazepam  Under the care of [facility] for many many years.  Review of Systems:  Review of  Systems  Constitutional:  Negative for fatigue.  Respiratory:  Negative for shortness of breath.   Cardiovascular:  Negative for palpitations.  Musculoskeletal:  Positive for arthralgias, back pain and gait problem.  Neurological:  Negative for tremors.  Psychiatric/Behavioral:  Negative for agitation, behavioral problems, confusion, decreased concentration, dysphoric mood, hallucinations, self-injury, sleep disturbance and suicidal ideas. The patient is not nervous/anxious and is not hyperactive.     Medications: I have reviewed the patient's current medications.  Current Outpatient Medications  Medication Sig Dispense Refill   apixaban  (ELIQUIS ) 5 MG TABS tablet TAKE 1 TABLET(5 MG) BY MOUTH TWICE DAILY 180 tablet 0   Biotin 5 MG CAPS Take 5 mg by mouth daily.     Cyanocobalamin (VITAMIN B-12) 5000 MCG TBDP Take  5,000 mcg by mouth in the morning.     diphenhydramine-acetaminophen  (TYLENOL  PM) 25-500 MG TABS Take 2 tablets by mouth at bedtime.     metoprolol  tartrate (LOPRESSOR ) 50 MG tablet TAKE 1 TABLET(50 MG) BY MOUTH TWICE DAILY 180 tablet 3   Multiple Vitamin (MULTIVITAMIN WITH MINERALS) TABS tablet Take 1 tablet by mouth in the morning and at bedtime.     NORLYDA 0.35 MG tablet Take 0.35 mg by mouth every evening.     triamterene -hydrochlorothiazide (MAXZIDE-25) 37.5-25 MG tablet Take 1 tablet by mouth daily. Please call [phone] to schedule an appointment with Dr. Raford for future refills. Thank you. 30 tablet 1   FLUoxetine  (PROZAC ) 40 MG capsule Take 2 capsules (80 mg total) by mouth daily. 180 capsule 1   LORazepam  (ATIVAN ) 0.5 MG tablet Take 1 tablet (0.5 mg total) by mouth 2 (two) times daily. 60 tablet 5   No current facility-administered medications for this visit.    Medication Side Effects: None  Allergies:  Allergies  Allergen Reactions   Darvocet [Propoxyphene N-Acetaminophen ] Rash   Penicillins Swelling    Eye swelling    Past Medical History:   Diagnosis Date   Anemia    Anxiety    Aortic stenosis 11/04/2021   Arthritis    knees    Atrial fibrillation (HCC)    Headache    hx of migraines    History of hiatal hernia    History of kidney stones    Hypertension    LVH (left ventricular hypertrophy) due to hypertensive disease 11/2022   Morbid obesity with BMI of 60.0-69.9, adult (HCC)    BMI 62   Murmur 08/15/2017   Pneumonia 07/18/2011   hx of pneumonia after gastric bypass in 2015   Preoperative evaluation of a medical condition to rule out surgical contraindications (TAR required) 11/04/2021   Sleep apnea    cpap    Family History  Problem Relation Age of Onset   Arrhythmia Mother        atrial fibrillation   Hypertension Mother    Arrhythmia Father        atrial fibrillation   Hypertension Father    Cancer Father        prostate   Diabetes Father    Colon cancer Father    Kidney Stones Father    Hypertension Brother    Diabetes Paternal Aunt    Stroke Paternal Aunt    Mental illness Paternal Aunt    Diabetes Paternal Uncle    Breast cancer Maternal Grandmother    Heart disease Maternal Grandfather    Heart attack Maternal Grandfather    Cancer Paternal Grandmother        liver    Social History   Socioeconomic History   Marital status: Married    Spouse name: Not on file   Number of children: Not on file   Years of education: Not on file   Highest education level: Not on file  Occupational History   Not on file  Tobacco Use   Smoking status: Never   Smokeless tobacco: Never  Vaping Use   Vaping status: Never Used  Substance and Sexual Activity   Alcohol use: No   Drug use: No   Sexual activity: Yes    Birth control/protection: Pill    Comment: azurette   Other Topics Concern   Not on file  Social History Narrative   Not on file   Social Drivers of Corporate investment banker  Strain: Not on file  Food Insecurity: Not on file  Transportation Needs: Not on file  Physical  Activity: Not on file  Stress: Not on file  Social Connections: Not on file  Intimate Partner Violence: Not on file    Past Medical History, Surgical history, Social history, and Family history were reviewed and updated as appropriate.   Please see review of systems for further details on the patient's review from today.   Objective:   Physical Exam:  There were no vitals taken for this visit.  Physical Exam Neurological:     Mental Status: She is alert and oriented to person, place, and time.     Cranial Nerves: No dysarthria.  Psychiatric:        Attention and Perception: Attention and perception normal.        Mood and Affect: Mood is anxious. Mood is not depressed.        Speech: Speech normal.        Behavior: Behavior is cooperative.        Thought Content: Thought content normal. Thought content is not paranoid or delusional. Thought content does not include homicidal or suicidal ideation. Thought content does not include suicidal plan.        Cognition and Memory: Cognition and memory normal.        Judgment: Judgment normal.     Comments: Insight intact Minimal obsessions Less anxious     Lab Review:     Component Value Date/Time   NA 140 04/25/2023 0747   NA 140 01/16/2023 0951   K 3.4 (L) 04/25/2023 0747   CL 104 04/25/2023 0747   CO2 26 04/25/2023 0747   GLUCOSE 100 (H) 04/25/2023 0747   BUN 17 04/25/2023 0747   BUN 21 01/16/2023 0951   CREATININE 0.92 04/25/2023 0747   CREATININE 0.57 08/25/2014 0836   CALCIUM 8.7 (L) 04/25/2023 0747   PROT 6.3 (L) 11/17/2021 1053   PROT 6.5 12/11/2020 1122   ALBUMIN 3.6 11/17/2021 1053   ALBUMIN 4.2 12/11/2020 1122   AST 19 11/17/2021 1053   ALT 17 11/17/2021 1053   ALKPHOS 87 11/17/2021 1053   BILITOT 1.0 11/17/2021 1053   BILITOT 0.7 12/11/2020 1122   GFRNONAA >60 04/25/2023 0747   GFRAA 95 12/11/2020 1122       Component Value Date/Time   WBC 11.9 (H) 04/25/2023 0747   RBC 4.79 04/25/2023 0747   HGB  13.8 04/25/2023 0747   HGB 15.0 01/16/2023 0951   HGB 13.1 06/05/2014 0920   HCT 44.3 04/25/2023 0747   HCT 47.4 (H) 01/16/2023 0951   HCT 41.1 06/05/2014 0920   PLT 311 04/25/2023 0747   PLT 341 01/16/2023 0951   MCV 92.5 04/25/2023 0747   MCV 89 01/16/2023 0951   MCV 89.2 06/05/2014 0920   MCH 28.8 04/25/2023 0747   MCHC 31.2 04/25/2023 0747   RDW 14.9 04/25/2023 0747   RDW 13.3 01/16/2023 0951   RDW 14.3 06/05/2014 0920   LYMPHSABS 1.4 01/01/2015 0621   LYMPHSABS 1.9 06/05/2014 0920   MONOABS 1.6 (H) 01/01/2015 0621   MONOABS 0.9 06/05/2014 0920   EOSABS 0.1 01/01/2015 0621   EOSABS 0.2 06/05/2014 0920   BASOSABS 0.0 01/01/2015 0621   BASOSABS 0.1 06/05/2014 0920    No results found for: POCLITH, LITHIUM   No results found for: PHENYTOIN, PHENOBARB, VALPROATE, CBMZ   .res Assessment: Plan:    [name] was seen today for follow-up, depression, anxiety and medication problem.  Diagnoses and all orders for this visit:  Mixed obsessional thoughts and acts -     LORazepam  (ATIVAN ) 0.5 MG tablet; Take 1 tablet (0.5 mg total) by mouth 2 (two) times daily. -     FLUoxetine  (PROZAC ) 40 MG capsule; Take 2 capsules (80 mg total) by mouth daily.  Seasonal depression (HCC) -     FLUoxetine  (PROZAC ) 40 MG capsule; Take 2 capsules (80 mg total) by mouth daily.  Generalized anxiety disorder  Severe obstructive sleep apnea    30 min video face to face time with patient was spent on counseling and coordination of care. We discussed OCD manageable.  Not much depression at present.  Tolerating meds.  Rare BZ. High relapse risk in OCD without SSRI .  She agrees to continue  Anxiety is much better with increase fluoxetine  and daily use of lorazepam  and counseling helps.  Continue fluoxetine  80 mg daily Use more  lorazepam  0.5 mg AM  and prn  Consider buspirone or Abilify for worsening anxiety.  We discussed the short-term risks associated with benzodiazepines  including sedation and increased fall risk among others.  Discussed long-term side effect risk including dependence, potential withdrawal symptoms, and the potential eventual dose-related risk of dementia.  But recent studies from 2020 dispute this association between benzodiazepines and dementia risk. Newer studies in 2020 do not support an association with dementia.  Extensive discussion of importance of CPAP for brain health and OSA noted.  She is compliant and benefiting.  Continued counseling.  H retired and a bit of adjustment for her.  Been about 18 mos now.  FU 1 mos  Lorene Macintosh, MD, DFAPA   Please see After Visit Summary for patient specific instructions.  No future appointments.   No orders of the defined types were placed in this encounter.     -------------------------------

## 2024-06-27 ENCOUNTER — Emergency Department (HOSPITAL_BASED_OUTPATIENT_CLINIC_OR_DEPARTMENT_OTHER)

## 2024-06-27 ENCOUNTER — Emergency Department (HOSPITAL_BASED_OUTPATIENT_CLINIC_OR_DEPARTMENT_OTHER)
Admission: EM | Admit: 2024-06-27 | Discharge: 2024-06-27 | Disposition: A | Discharge status: Home / Self Care | Attending: Emergency Medicine | Admitting: Emergency Medicine

## 2024-06-27 ENCOUNTER — Encounter (HOSPITAL_BASED_OUTPATIENT_CLINIC_OR_DEPARTMENT_OTHER): Payer: Self-pay

## 2024-06-27 DIAGNOSIS — D72829 Elevated white blood cell count, unspecified: Secondary | ICD-10-CM | POA: Insufficient documentation

## 2024-06-27 DIAGNOSIS — N200 Calculus of kidney: Secondary | ICD-10-CM | POA: Diagnosis not present

## 2024-06-27 DIAGNOSIS — R112 Nausea with vomiting, unspecified: Secondary | ICD-10-CM | POA: Diagnosis not present

## 2024-06-27 DIAGNOSIS — Z79899 Other long term (current) drug therapy: Secondary | ICD-10-CM | POA: Diagnosis not present

## 2024-06-27 DIAGNOSIS — I10 Essential (primary) hypertension: Secondary | ICD-10-CM | POA: Diagnosis not present

## 2024-06-27 DIAGNOSIS — Z7901 Long term (current) use of anticoagulants: Secondary | ICD-10-CM | POA: Diagnosis not present

## 2024-06-27 DIAGNOSIS — R109 Unspecified abdominal pain: Secondary | ICD-10-CM | POA: Diagnosis not present

## 2024-06-27 DIAGNOSIS — N3000 Acute cystitis without hematuria: Secondary | ICD-10-CM | POA: Diagnosis not present

## 2024-06-27 DIAGNOSIS — K449 Diaphragmatic hernia without obstruction or gangrene: Secondary | ICD-10-CM | POA: Diagnosis not present

## 2024-06-27 LAB — URINALYSIS, ROUTINE W REFLEX MICROSCOPIC
Bilirubin Urine: NEGATIVE
Glucose, UA: NEGATIVE mg/dL
Ketones, ur: NEGATIVE mg/dL
Nitrite: POSITIVE — AB
Protein, ur: NEGATIVE mg/dL
Specific Gravity, Urine: 1.014 (ref 1.005–1.030)
WBC, UA: >50 WBC/hpf (ref 0–5)
pH: 7 (ref 5.0–8.0)

## 2024-06-27 LAB — CBC WITH DIFFERENTIAL/PLATELET
Abs Immature Granulocytes: 0.08 K/uL — ABNORMAL HIGH (ref 0.00–0.07)
Basophils Absolute: 0 K/uL (ref 0.0–0.1)
Basophils Relative: 0 %
Eosinophils Absolute: 0 K/uL (ref 0.0–0.5)
Eosinophils Relative: 0 %
HCT: 49 % — ABNORMAL HIGH (ref 36.0–46.0)
Hemoglobin: 15.6 g/dL — ABNORMAL HIGH (ref 12.0–15.0)
Immature Granulocytes: 1 %
Lymphocytes Relative: 9 %
Lymphs Abs: 1.5 K/uL (ref 0.7–4.0)
MCH: 29.8 pg (ref 26.0–34.0)
MCHC: 31.8 g/dL (ref 30.0–36.0)
MCV: 93.7 fL (ref 80.0–100.0)
Monocytes Absolute: 0.8 K/uL (ref 0.1–1.0)
Monocytes Relative: 5 %
Neutro Abs: 14.2 K/uL — ABNORMAL HIGH (ref 1.7–7.7)
Neutrophils Relative %: 85 %
Platelets: 266 K/uL (ref 150–400)
RBC: 5.23 MIL/uL — ABNORMAL HIGH (ref 3.87–5.11)
RDW: 14.9 % (ref 11.5–15.5)
WBC: 16.6 K/uL — ABNORMAL HIGH (ref 4.0–10.5)
nRBC: 0 % (ref 0.0–0.2)

## 2024-06-27 LAB — COMPREHENSIVE METABOLIC PANEL WITH GFR
ALT: 17 U/L (ref 0–44)
AST: 24 U/L (ref 15–41)
Albumin: 4.1 g/dL (ref 3.5–5.0)
Alkaline Phosphatase: 98 U/L (ref 38–126)
Anion gap: 6 (ref 5–15)
BUN: 25 mg/dL — ABNORMAL HIGH (ref 6–20)
CO2: 32 mmol/L (ref 22–32)
Calcium: 9.5 mg/dL (ref 8.9–10.3)
Chloride: 100 mmol/L (ref 98–111)
Creatinine, Ser: 0.83 mg/dL (ref 0.44–1.00)
GFR, Estimated: >60 mL/min (ref >60)
Glucose, Bld: 148 mg/dL — ABNORMAL HIGH (ref 70–99)
Potassium: 4.3 mmol/L (ref 3.5–5.1)
Sodium: 138 mmol/L (ref 135–145)
Total Bilirubin: 0.9 mg/dL (ref 0.0–1.2)
Total Protein: 6.8 g/dL (ref 6.5–8.1)

## 2024-06-27 LAB — LIPASE, BLOOD: Lipase: 33 U/L (ref 11–51)

## 2024-06-27 MED ORDER — SODIUM CHLORIDE 0.9 % IV BOLUS
1000.0000 mL | Freq: Once | INTRAVENOUS | Status: AC
  Administered 2024-06-27: 1000 mL via INTRAVENOUS

## 2024-06-27 MED ORDER — FENTANYL CITRATE PF 50 MCG/ML IJ SOSY
50.0000 ug | PREFILLED_SYRINGE | Freq: Once | INTRAMUSCULAR | Status: AC
  Administered 2024-06-27: 50 ug via INTRAVENOUS
  Filled 2024-06-27: qty 1

## 2024-06-27 MED ORDER — IOHEXOL 300 MG/ML  SOLN: 100.0000 mL | Freq: Once | INTRAMUSCULAR | Status: DC | PRN

## 2024-06-27 MED ORDER — ONDANSETRON 4 MG PO TBDP
4.0000 mg | ORAL_TABLET | Freq: Three times a day (TID) | ORAL | 0 refills | Status: AC | PRN
Start: 2024-06-27 — End: ?

## 2024-06-27 MED ORDER — IOHEXOL 350 MG/ML SOLN
100.0000 mL | Freq: Once | INTRAVENOUS | Status: AC | PRN
  Administered 2024-06-27: 100 mL via INTRAVENOUS

## 2024-06-27 MED ORDER — ONDANSETRON HCL 4 MG/2ML IJ SOLN
4.0000 mg | Freq: Once | INTRAMUSCULAR | Status: AC
  Administered 2024-06-27: 4 mg via INTRAVENOUS
  Filled 2024-06-27: qty 2

## 2024-06-27 MED ORDER — SODIUM CHLORIDE 0.9 % IV SOLN
2.0000 g | Freq: Once | INTRAVENOUS | Status: AC
  Administered 2024-06-27: 2 g via INTRAVENOUS
  Filled 2024-06-27: qty 20

## 2024-06-27 MED ORDER — CEPHALEXIN 500 MG PO CAPS: 500.0000 mg | ORAL_CAPSULE | Freq: Three times a day (TID) | ORAL | 0 refills | Status: AC

## 2024-06-27 NOTE — ED Provider Notes (Signed)
 [place] EMERGENCY DEPARTMENT AT [facility] Provider Note   CSN: [id number] Arrival date & time: 06/27/24  9079     Patient presents with: Abdominal Pain and Emesis   [name] is a 58 y.o. female.   Patient here with abdominal pain and nausea vomiting.  History of A-fib on Eliquis .  History of hypertension morbid obesity kidney stones.  Nothing makes worse or better.  Denies any fever or chills.  Patient with history of gastric bypass.  Pain mostly left-sided upper abdomen.  Does not feel like her kidney stone pain.  She took some narcotic pain medicine overnight then it seemed to help.  She has been passing gas.  No diarrhea.  The history is provided by the patient.       Prior to Admission medications   Medication Sig Start Date End Date Taking? Authorizing Provider  cephALEXin  (KEFLEX ) 500 MG capsule Take 1 capsule (500 mg total) by mouth 3 (three) times daily for 5 days. 06/27/24 07/02/24 Yes Kaylin Schellenberg, DO  ondansetron  (ZOFRAN -ODT) 4 MG disintegrating tablet Take 1 tablet (4 mg total) by mouth every 8 (eight) hours as needed. 06/27/24  Yes Keauna Brasel, DO  apixaban  (ELIQUIS ) 5 MG TABS tablet TAKE 1 TABLET(5 MG) BY MOUTH TWICE DAILY 06/07/24   Raford Riggs, MD  Biotin 5 MG CAPS Take 5 mg by mouth daily.    [provider]  Cyanocobalamin (VITAMIN B-12) 5000 MCG TBDP Take 5,000 mcg by mouth in the morning.    [provider]  diphenhydramine-acetaminophen  (TYLENOL  PM) 25-500 MG TABS Take 2 tablets by mouth at bedtime.    [provider]  FLUoxetine  (PROZAC ) 40 MG capsule Take 2 capsules (80 mg total) by mouth daily. 06/26/24   Cottle, Lorene KANDICE Raddle., MD  LORazepam  (ATIVAN ) 0.5 MG tablet Take 1 tablet (0.5 mg total) by mouth 2 (two) times daily. 06/26/24   Cottle, Carey G Jr., MD  metoprolol  tartrate (LOPRESSOR ) 50 MG tablet TAKE 1 TABLET(50 MG) BY MOUTH TWICE DAILY 12/13/23   Raford Riggs, MD  Multiple Vitamin (MULTIVITAMIN  WITH MINERALS) TABS tablet Take 1 tablet by mouth in the morning and at bedtime.    [provider]  NORLYDA 0.35 MG tablet Take 0.35 mg by mouth every evening. 08/24/20   [provider]  triamterene -hydrochlorothiazide (MAXZIDE-25) 37.5-25 MG tablet Take 1 tablet by mouth daily. Please call [phone] to schedule an appointment with Dr. Raford for future refills. Thank you. 06/06/24   Raford Riggs, MD    Allergies: Darvocet [propoxyphene n-acetaminophen ] and Penicillins    Review of Systems  Updated Vital Signs BP 120/73   Pulse 76   Temp 97.9 F (36.6 C)   Resp (!) 25   SpO2 97%   Physical Exam Vitals and nursing note reviewed.  Constitutional:      General: She is not in acute distress.    Appearance: She is well-developed. She is not ill-appearing.  HENT:     Head: Normocephalic and atraumatic.     Nose: Nose normal.     Mouth/Throat:     Mouth: Mucous membranes are moist.  Eyes:     Extraocular Movements: Extraocular movements intact.     Conjunctiva/sclera: Conjunctivae normal.     Pupils: Pupils are equal, round, and reactive to light.  Cardiovascular:     Rate and Rhythm: Normal rate and regular rhythm.     Pulses: Normal pulses.     Heart sounds: Normal heart sounds. No murmur heard.  Pulmonary:     Effort: Pulmonary effort is normal. No respiratory distress.     Breath sounds: Normal breath sounds.  Abdominal:     Palpations: Abdomen is soft.     Tenderness: There is abdominal tenderness.  Musculoskeletal:        General: No swelling.     Cervical back: Neck supple.  Skin:    General: Skin is warm and dry.     Capillary Refill: Capillary refill takes less than 2 seconds.  Neurological:     General: No focal deficit present.     Mental Status: She is alert.  Psychiatric:        Mood and Affect: Mood normal.     (all labs ordered are listed, but only abnormal results are displayed) Labs Reviewed  CBC WITH DIFFERENTIAL/PLATELET  - Abnormal; Notable for the following components:      Result Value   WBC 16.6 (*)    RBC 5.23 (*)    Hemoglobin 15.6 (*)    HCT 49.0 (*)    Neutro Abs 14.2 (*)    Abs Immature Granulocytes 0.08 (*)    All other components within normal limits  COMPREHENSIVE METABOLIC PANEL WITH GFR - Abnormal; Notable for the following components:   Glucose, Bld 148 (*)    BUN 25 (*)    All other components within normal limits  URINALYSIS, ROUTINE W REFLEX MICROSCOPIC - Abnormal; Notable for the following components:   APPearance HAZY (*)    Hgb urine dipstick SMALL (*)    Nitrite POSITIVE (*)    Leukocytes,Ua LARGE (*)    Bacteria, UA MANY (*)    All other components within normal limits  URINE CULTURE  LIPASE, BLOOD    EKG: EKG Interpretation Date/Time:  Thursday June 27 2024 09:29:41 EDT Ventricular Rate:  97 PR Interval:    QRS Duration:  107 QT Interval:  383 QTC Calculation: 456 R Axis:   128  Text Interpretation: Atrial fibrillation Right axis deviation Confirmed by Ruthe Cornet [phone]) on 06/27/2024 9:35:08 AM  Radiology: CT ABDOMEN PELVIS W CONTRAST Result Date: 06/27/2024 CLINICAL DATA:  Abdominal pain, acute, nonlocalized. EXAM: CT ABDOMEN AND PELVIS WITH CONTRAST TECHNIQUE: Multidetector CT imaging of the abdomen and pelvis was performed using the standard protocol following bolus administration of intravenous contrast. RADIATION DOSE REDUCTION: This exam was performed according to the departmental dose-optimization program which includes automated exposure control, adjustment of the mA and/or kV according to patient size and/or use of iterative reconstruction technique. CONTRAST:  OMNIPAQUE  IOHEXOL  350 MG/ML SOLN COMPARISON:  CT scan abdomen pelvis from 01/22/2023. FINDINGS: Lower chest: The lung bases are clear. No pleural effusion. The heart is normal in size. No pericardial effusion. Dense mitral annulus calcifications noted. Hepatobiliary: The liver is normal in size.  Non-cirrhotic configuration. No suspicious mass. These is mild diffuse hepatic steatosis. No intrahepatic or extrahepatic bile duct dilation. Gallbladder is surgically absent. Pancreas: Unremarkable. No pancreatic ductal dilatation or surrounding inflammatory changes. Spleen: Within normal limits. No focal lesion. Adrenals/Urinary Tract: Adrenal glands are unremarkable. No suspicious renal mass. There are at least 4 nonobstructing calculi in the right kidney with largest measuring up to 6 x 10 mm. There is also a single 2 x 4 mm nonobstructing calculus in the left kidney. No obstructive uropathy or ureterolithiasis on either side. Urinary bladder is under distended, precluding optimal assessment. However, no large mass or stones identified. No perivesical fat stranding. Stomach/Bowel: Postsurgical changes of gastric bypass noted. There is  a small sliding hiatal hernia. No disproportionate dilation of the small or large bowel loops. No evidence of abnormal bowel wall thickening or inflammatory changes. The appendix is surgically absent. Vascular/Lymphatic: No ascites or pneumoperitoneum. No abdominal or pelvic lymphadenopathy, by size criteria. No aneurysmal dilation of the major abdominal arteries. There are mild peripheral atherosclerotic vascular calcifications of the aorta and its major branches. Reproductive: The uterus is unremarkable. No large adnexal mass. Other: There is a large left paramedian mid abdominal ventral hernia containing multiple unobstructed small bowel loops as well as portion of transverse colon. No herniated bowel wall thickening or fat stranding noted to suggest vascular compromise. The soft tissues and abdominal wall are otherwise unremarkable. Musculoskeletal: No suspicious osseous lesions. There are mild - moderate multilevel degenerative changes in the visualized spine. IMPRESSION: 1. No acute inflammatory process identified within the abdomen or pelvis. 2. There is a large left  paramedian mid abdominal ventral hernia containing multiple unobstructed small bowel loops as well as portion of transverse colon. No herniated bowel wall thickening or fat stranding noted to suggest vascular compromise. 3. Multiple other nonacute observations, as described above. Aortic Atherosclerosis (ICD10-I70.0). Electronically Signed   By: Ree Molt M.D.   On: 06/27/2024 12:31     Procedures   Medications Ordered in the ED  cefTRIAXone  (ROCEPHIN ) 2 g in sodium chloride  0.9 % 100 mL IVPB (2 g Intravenous New Bag/Given 06/27/24 1223)  iohexol  (OMNIPAQUE ) 300 MG/ML solution 100 mL (has no administration in time range)  sodium chloride  0.9 % bolus 1,000 mL (0 mLs Intravenous Stopped 06/27/24 1106)  ondansetron  (ZOFRAN ) injection 4 mg (4 mg Intravenous Given 06/27/24 1000)  fentaNYL  (SUBLIMAZE ) injection 50 mcg (50 mcg Intravenous Given 06/27/24 1001)  iohexol  (OMNIPAQUE ) 350 MG/ML injection 100 mL (100 mLs Intravenous Contrast Given 06/27/24 1204)                                    Medical Decision Making Amount and/or Complexity of Data Reviewed Labs: ordered. Radiology: ordered.  Risk Prescription drug management.   [name] is here with abdominal pain.  Unremarkable vitals.  History of hypertension morbid obesity A-fib on anticoagulation kidney stones.  Differential diagnosis includes colitis/pancreatitis/bowel obstruction.  She is already had her gallbladder and appendix removed in the past.  She has had aGastric bypass.  Will check CBC CMP lipase urinalysis get a CT scan abdomen pelvis give IV fluids IV fentanyl  IV Zofran  and reevaluate.  This appears consistent with infection.  IV antibiotic given.  White count 16.  However she has no fever.  I do not think she is septic.  Electrolytes are unremarkable.  CT scan of the abdomen and pelvis shows no acute findings.  Overall she is feeling much better.  Will treat her outpatient with antibiotics and Zofran .  She understands  to return if symptoms worsen.  Follow-up with primary care.  This chart was dictated using voice recognition software.  Despite best efforts to proofread,  errors can occur which can change the documentation meaning.      Final diagnoses:  Acute cystitis without hematuria    ED Discharge Orders          Ordered    cephALEXin  (KEFLEX ) 500 MG capsule  3 times daily        06/27/24 1241    ondansetron  (ZOFRAN -ODT) 4 MG disintegrating tablet  Every 8 hours PRN  06/27/24 1241               Ruthe Cornet, DO 06/27/24 1243

## 2024-06-27 NOTE — ED Triage Notes (Signed)
 Patient reports abdominal pain and vomiting since 1 am today. She has hx of hernia. She has had an appendectomy, gastric bypass, and gallbladder removal.

## 2024-06-29 LAB — URINE CULTURE: Culture: >=100000 — AB

## 2024-06-30 ENCOUNTER — Telehealth (HOSPITAL_BASED_OUTPATIENT_CLINIC_OR_DEPARTMENT_OTHER): Payer: Self-pay | Admitting: *Deleted

## 2024-06-30 NOTE — Telephone Encounter (Signed)
 Post ED Visit - Positive Culture Follow-up  Culture report reviewed by antimicrobial stewardship pharmacist: [facility] []  Rankin Dee, Pharm.D. []  Venetia Gully, Pharm.D., BCPS AQ-ID []  Garrel Crews, Pharm.D., BCPS []  Almarie Lunger, [address].D., BCPS []  [place], [address].D., BCPS, AAHIVP []  Rosaline Bihari, Pharm.D., BCPS, AAHIVP []  Vernell Meier, PharmD, BCPS []  Latanya Hint, PharmD, BCPS []  Donald Medley, PharmD, BCPS []  Rocky Bold, PharmD []  Dorothyann Alert, PharmD, BCPS [x]  Dorn Buttner, PharmD  [facility] []  Rosaline Edison, PharmD []  Romona Bliss, PharmD []  Dolphus Roller, PharmD []  Veva Seip, Rph []  Vernell Daunt) Leonce, PharmD []  Eva Allis, PharmD []  Rosaline Millet, PharmD []  Iantha Batch, PharmD []  Arvin Gauss, PharmD []  Wanda Hasting, PharmD []  Ronal Rav, PharmD []  Rocky Slade, PharmD []  Bard Jeans, PharmD   Positive urine culture Treated with Cephalexin , organism sensitive to the same and no further patient follow-up is required at this time.  Albino Alan Novak 06/30/2024, 1:37 PM

## 2024-07-03 ENCOUNTER — Other Ambulatory Visit (HOSPITAL_BASED_OUTPATIENT_CLINIC_OR_DEPARTMENT_OTHER): Payer: Self-pay

## 2024-07-03 ENCOUNTER — Encounter: Payer: Self-pay | Admitting: Internal Medicine

## 2024-07-03 MED ORDER — ZEPBOUND 2.5 MG/0.5ML ~~LOC~~ SOAJ
2.5000 mg | SUBCUTANEOUS | 0 refills | Status: AC
  Filled 2024-07-03: qty 2, 28d supply, fill #0

## 2024-07-05 DIAGNOSIS — F411 Generalized anxiety disorder: Secondary | ICD-10-CM | POA: Diagnosis not present

## 2024-07-06 ENCOUNTER — Encounter (HOSPITAL_BASED_OUTPATIENT_CLINIC_OR_DEPARTMENT_OTHER): Payer: Self-pay

## 2024-07-08 ENCOUNTER — Other Ambulatory Visit (HOSPITAL_BASED_OUTPATIENT_CLINIC_OR_DEPARTMENT_OTHER): Payer: Self-pay

## 2024-07-11 DIAGNOSIS — F411 Generalized anxiety disorder: Secondary | ICD-10-CM | POA: Diagnosis not present

## 2024-07-18 DIAGNOSIS — F411 Generalized anxiety disorder: Secondary | ICD-10-CM | POA: Diagnosis not present

## 2024-07-25 DIAGNOSIS — F411 Generalized anxiety disorder: Secondary | ICD-10-CM | POA: Diagnosis not present

## 2024-07-26 ENCOUNTER — Other Ambulatory Visit (HOSPITAL_BASED_OUTPATIENT_CLINIC_OR_DEPARTMENT_OTHER): Payer: Self-pay

## 2024-08-01 ENCOUNTER — Other Ambulatory Visit: Payer: Self-pay | Admitting: Obstetrics and Gynecology

## 2024-08-01 DIAGNOSIS — F411 Generalized anxiety disorder: Secondary | ICD-10-CM | POA: Diagnosis not present

## 2024-08-01 DIAGNOSIS — Z1231 Encounter for screening mammogram for malignant neoplasm of breast: Secondary | ICD-10-CM

## 2024-08-05 ENCOUNTER — Other Ambulatory Visit: Payer: Self-pay | Admitting: Cardiovascular Disease

## 2024-08-07 ENCOUNTER — Telehealth: Payer: Self-pay | Admitting: Cardiovascular Disease

## 2024-08-07 MED ORDER — TRIAMTERENE-HCTZ 37.5-25 MG PO TABS: 1.0000 | ORAL_TABLET | Freq: Every day | ORAL | 0 refills | Status: DC

## 2024-08-07 NOTE — Telephone Encounter (Signed)
 Pt's medication was sent to pt's pharmacy as requested. Confirmation received.

## 2024-08-07 NOTE — Telephone Encounter (Signed)
*  STAT* If patient is at the pharmacy, call can be transferred to refill team.   1. Which medications need to be refilled? (please list name of each medication and dose if known)   triamterene -hydrochlorothiazide (MAXZIDE-25) 37.5-25 MG tablet    2. Which pharmacy/location (including street and city if local pharmacy) is medication to be sent to?  [id number] - [place], [place] - [address]      3. Do they need a 30 day or 90 day supply? 90 day    Pt is out of medication. She stated refill request was denied but pt isn't due for a f/u until after 08/19/24.

## 2024-08-13 DIAGNOSIS — F411 Generalized anxiety disorder: Secondary | ICD-10-CM | POA: Diagnosis not present

## 2024-08-17 ENCOUNTER — Other Ambulatory Visit (HOSPITAL_BASED_OUTPATIENT_CLINIC_OR_DEPARTMENT_OTHER): Payer: Self-pay

## 2024-08-20 DIAGNOSIS — F411 Generalized anxiety disorder: Secondary | ICD-10-CM | POA: Diagnosis not present

## 2024-08-20 DIAGNOSIS — G4733 Obstructive sleep apnea (adult) (pediatric): Secondary | ICD-10-CM | POA: Diagnosis not present

## 2024-08-27 DIAGNOSIS — F411 Generalized anxiety disorder: Secondary | ICD-10-CM | POA: Diagnosis not present

## 2024-09-02 ENCOUNTER — Other Ambulatory Visit (HOSPITAL_BASED_OUTPATIENT_CLINIC_OR_DEPARTMENT_OTHER): Payer: Self-pay | Admitting: Cardiovascular Disease

## 2024-09-02 ENCOUNTER — Other Ambulatory Visit: Payer: Self-pay | Admitting: Cardiovascular Disease

## 2024-09-02 NOTE — Telephone Encounter (Signed)
 Prescription refill request for Eliquis  received. Indication: Last office visit:NEEDS APPT Scr: Age:  Weight:

## 2024-09-03 DIAGNOSIS — F411 Generalized anxiety disorder: Secondary | ICD-10-CM | POA: Diagnosis not present

## 2024-09-10 DIAGNOSIS — F411 Generalized anxiety disorder: Secondary | ICD-10-CM | POA: Diagnosis not present

## 2024-09-21 ENCOUNTER — Other Ambulatory Visit: Payer: Self-pay | Admitting: Psychiatry

## 2024-09-21 DIAGNOSIS — F338 Other recurrent depressive disorders: Secondary | ICD-10-CM

## 2024-09-21 DIAGNOSIS — F422 Mixed obsessional thoughts and acts: Secondary | ICD-10-CM

## 2024-09-24 DIAGNOSIS — F411 Generalized anxiety disorder: Secondary | ICD-10-CM | POA: Diagnosis not present

## 2024-09-26 ENCOUNTER — Encounter: Payer: Self-pay | Admitting: Psychiatry

## 2024-09-26 ENCOUNTER — Telehealth: Admitting: Psychiatry

## 2024-09-26 DIAGNOSIS — F422 Mixed obsessional thoughts and acts: Secondary | ICD-10-CM

## 2024-09-26 DIAGNOSIS — F338 Other recurrent depressive disorders: Secondary | ICD-10-CM | POA: Diagnosis not present

## 2024-09-26 DIAGNOSIS — F411 Generalized anxiety disorder: Secondary | ICD-10-CM

## 2024-09-26 MED ORDER — LORAZEPAM 0.5 MG PO TABS: 0.5000 mg | ORAL_TABLET | Freq: Two times a day (BID) | ORAL | 5 refills | Status: AC

## 2024-09-26 MED ORDER — FLUOXETINE HCL 40 MG PO CAPS: 80.0000 mg | ORAL_CAPSULE | Freq: Every day | ORAL | 1 refills | Status: AC

## 2024-09-26 NOTE — Progress Notes (Signed)
 [name] [id number] 1966-04-15 58 y.o.  Video Visit via My Chart  I connected with pt by My Chart and verified that I am speaking with the correct person using two identifiers.   I discussed the limitations, risks, security and privacy concerns of performing an evaluation and management service by My Chart  and the availability of in person appointments. I also discussed with the patient that there may be a patient responsible charge related to this service. The patient expressed understanding and agreed to proceed.  I discussed the assessment and treatment plan with the patient. The patient was provided an opportunity to ask questions and all were answered. The patient agreed with the plan and demonstrated an understanding of the instructions.   The patient was advised to call back or seek an in-person evaluation if the symptoms worsen or if the condition fails to improve as anticipated.  I provided 15 minutes of video time during this encounter.  The patient was located at home and the provider was located office. Session from 1130-1200  Subjective:   Patient ID:  [name] is a 57 y.o. (DOB 16-Jun-1966) female.  Chief Complaint:  Chief Complaint  Patient presents with   Follow-up   Anxiety  FU anxiety and mood.   HPI  [name] presents to the office today for follow-up of depression with seasonality and anxiety.     seen July 2020.  No meds were changed.  04/10/20 appt.  The following noted: Teaches online.  Difficult during Covid.  Vaccinated which helped stress over the virus.  Able to get out more.  Less anxiety.   Difficult to stay home all these months.  Good vacation helped.  Mood generally OK but Covid hard and stress of school uncertainties.  Before vacation hard to get OOB but better since then.  Not really obsessing.  Works from home.  That helps keep her on track and schedule. No worsening OCD but anxiety with Covid is some better.  Last summer  hard to get OOB but OK now. Ok this winter so far.  Vitamin D  helped. Likes benefit lorazepam .  Only used rarely.  Otherwise usually ok. Plan: Continue fluoxetine  60 mg daily  12/22/2021 appt noted: Use lorazepam  just as needed for anxiety and not often. Remains on fluoxetine  60 mg daily.  No SE Good not much obsessing unless really tired.   Stressful season bc H working a lot so everything falls to her.  Coping well. Not much seasonal depression.  Taking vitamin D  in fall winter. D [employer] grad HS this year and going to [employer] Plan: no med changes  06/23/22 appt noted: Consistent with fluoxetine  60. No SE Use lorazepam  prn when visits family to keep from panic.  Not panic lately.  Anxiety is good. 3 weeks away H retiring and D going off to college.  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 Minimal obsessing unless really tired.  Under control.   Patient reports stable mood and denies depressed or irritable moods.  Patient denies any recent difficulty with anxiety.  Patient denies difficulty with sleep initiation or maintenance. Denies appetite disturbance.  Patient reports that energy and motivation have been good.  Patient denies any difficulty with concentration.  Patient denies any suicidal ideation. Health stable.  Works from home. Plan: No change indicated.  Continue fluoxetine  60 mg daily Rare lorzepam prn  12/29/22 appt noted: Good overall.  Needed more lorazepam  bc need more at [facility].  But not this month as much.  When with family needed more. 2 weeks ago dx afib and changed meds to metoprolol . Afib did not cause sx.   Anxiety ok now.  OCD is controlled.  No sig dep.  Sleep is good and no problems with meds. Victoria adjusted to school at college.   Plan: no changes  07/06/23 appt noted: Meds as above rare lorazepam  like with medical procedures and it helped. Dx afib on Eloquis.   But also severe OSA started CPAP May nightly 5-6 hours+. AHI 32 and  treated.  Feels more alert during the day.  Dream more.  Restorative sleep and better energy. Mood and anxiety good.  OCD managed.  Minimal unless overly tired.  Mentally fine.   No SE with meds.   Still working.  H retired from [employer] last year.  Will work another couple of years. D will start 2nd year college this year. Plan no changes  01/17/24 appt noted: Meds: Fluoxetine  60, lorazepam  0.5 every 8 hours as needed Only use lorazepam  around her family. Sleep is good.  Using CPAP and 100% better sleep.  Didn't realize how bad it was until using CPAP machine.     Mood and anxiety good.  OCD managed.  Less checking and mentally better with better sleep.  Anxiety ok. A  lot going on with work changes.  Nothing major otherwise. D settled into [place] and doing well.  [employer] 2 more years of school. No concerns with meds.  04/30/24 appt noted: Meds: Fluoxetine  60, lorazepam  0.5 every 8 hours as needed- avg once weekly No SE Moved appt up bc px.  Resumed counseling.  New bad boss.  M stopped talking to her.  A few health issues nothing serious.   Since March mor e anxiety and overwhelmed if have to go out and talk to people and avoiding church.  Hard time getting out of bed.  Difficult coping. Not obsessing. General overwhelmed and heaviness.  Can't deal with much.  Lorazepam  helps.   No new meds.   Plan: increase fluoxetine  80 mg daily Use more  lorazepam  0.5 mg AM  and prn  06/26/24 appt noted:  Med: fluoxetine  80, lorazepam  0.5 mg AM & prn No SE. Feeling much better.  Getting out of house more.  Less down.  More productive.  Less heaviness. Counseling helped.   Boss in [place] is about the same and requiring OT.  Good colleagues.   Benefit meds.   Sleep is fine.    09/26/24 appt noted: Med: fluoxetine  80, lorazepam  0.5 mg AM & prn No SE. Better with less anxiety and less dread.  Dep has lifted.  A lot better than I did. Obsesssing is better Going to weekly counseling and working  on [employer]. Sleep is good.  Getting outside more.  No health changes.   No other concerns.   Past Psychiatric Medication Trials:' no others. Fluoxetine  60 lorazepam  Under the care of [facility] for many many years.  Review of Systems:  Review of Systems  Constitutional:  Negative for fatigue.  Respiratory:  Negative for shortness of breath.   Cardiovascular:  Negative for palpitations.  Musculoskeletal:  Positive for arthralgias, back pain and gait problem.  Neurological:  Negative for tremors.  Psychiatric/Behavioral:  Negative for agitation, behavioral problems, confusion, decreased concentration, dysphoric mood, hallucinations, self-injury, sleep disturbance and suicidal ideas. The patient is not nervous/anxious and is not hyperactive.     Medications: I have reviewed the  patient's current medications.  Current Outpatient Medications  Medication Sig Dispense Refill   apixaban  (ELIQUIS ) 5 MG TABS tablet Take 1 tablet (5 mg total) by mouth 2 (two) times daily. NEEDS CARDIOLOGY APPT FOR REFILLS, CALL OFFICE [phone] 60 tablet 0   Biotin 5 MG CAPS Take 5 mg by mouth daily.     Cyanocobalamin (VITAMIN B-12) 5000 MCG TBDP Take 5,000 mcg by mouth in the morning.     diphenhydramine-acetaminophen  (TYLENOL  PM) 25-500 MG TABS Take 2 tablets by mouth at bedtime.     metoprolol  tartrate (LOPRESSOR ) 50 MG tablet TAKE 1 TABLET(50 MG) BY MOUTH TWICE DAILY 180 tablet 3   Multiple Vitamin (MULTIVITAMIN WITH MINERALS) TABS tablet Take 1 tablet by mouth in the morning and at bedtime.     NORLYDA 0.35 MG tablet Take 0.35 mg by mouth every evening.     ondansetron  (ZOFRAN -ODT) 4 MG disintegrating tablet Take 1 tablet (4 mg total) by mouth every 8 (eight) hours as needed. 20 tablet 0   tirzepatide  (ZEPBOUND ) 2.5 MG/0.5ML Pen Inject 2.5 mg into the skin once a week. 2 mL 0   triamterene -hydrochlorothiazide (MAXZIDE-25) 37.5-25 MG tablet TAKE 1 TABLET BY MOUTH DAILY 90 tablet 0    FLUoxetine  (PROZAC ) 40 MG capsule Take 2 capsules (80 mg total) by mouth daily. 180 capsule 1   LORazepam  (ATIVAN ) 0.5 MG tablet Take 1 tablet (0.5 mg total) by mouth 2 (two) times daily. 60 tablet 5   No current facility-administered medications for this visit.    Medication Side Effects: None  Allergies:  Allergies  Allergen Reactions   Darvocet [Propoxyphene N-Acetaminophen ] Rash   Penicillins Swelling    Eye swelling    Past Medical History:  Diagnosis Date   Anemia    Anxiety    Aortic stenosis 11/04/2021   Arthritis    knees    Atrial fibrillation (HCC)    Headache    hx of migraines    History of hiatal hernia    History of kidney stones    Hypertension    LVH (left ventricular hypertrophy) due to hypertensive disease 11/2022   Morbid obesity with BMI of 60.0-69.9, adult (HCC)    BMI 62   Murmur 08/15/2017   Pneumonia 07/18/2011   hx of pneumonia after gastric bypass in 2015   Preoperative evaluation of a medical condition to rule out surgical contraindications (TAR required) 11/04/2021   Sleep apnea    cpap    Family History  Problem Relation Age of Onset   Arrhythmia Mother        atrial fibrillation   Hypertension Mother    Arrhythmia Father        atrial fibrillation   Hypertension Father    Cancer Father        prostate   Diabetes Father    Colon cancer Father    Kidney Stones Father    Hypertension Brother    Diabetes Paternal Aunt    Stroke Paternal Aunt    Mental illness Paternal Aunt    Diabetes Paternal Uncle    Breast cancer Maternal Grandmother    Heart disease Maternal Grandfather    Heart attack Maternal Grandfather    Cancer Paternal Grandmother        liver    Social History   Socioeconomic History   Marital status: Married    Spouse name: Not on file   Number of children: Not on file   Years of education: Not on file  Highest education level: Not on file  Occupational History   Not on file  Tobacco Use   Smoking  status: Never   Smokeless tobacco: Never  Vaping Use   Vaping status: Never Used  Substance and Sexual Activity   Alcohol use: No   Drug use: No   Sexual activity: Yes    Birth control/protection: Pill    Comment: azurette   Other Topics Concern   Not on file  Social History Narrative   Not on file   Social Drivers of Health   Financial Resource Strain: Not on file  Food Insecurity: Not on file  Transportation Needs: Not on file  Physical Activity: Not on file  Stress: Not on file  Social Connections: Not on file  Intimate Partner Violence: Not on file    Past Medical History, Surgical history, Social history, and Family history were reviewed and updated as appropriate.   Please see review of systems for further details on the patient's review from today.   Objective:   Physical Exam:  There were no vitals taken for this visit.  Physical Exam Neurological:     Mental Status: She is alert and oriented to person, place, and time.     Cranial Nerves: No dysarthria.  Psychiatric:        Attention and Perception: Attention and perception normal.        Mood and Affect: Mood is not anxious or depressed.        Speech: Speech normal.        Behavior: Behavior is cooperative.        Thought Content: Thought content normal. Thought content is not paranoid or delusional. Thought content does not include homicidal or suicidal ideation. Thought content does not include suicidal plan.        Cognition and Memory: Cognition and memory normal.        Judgment: Judgment normal.     Comments: Insight intact Minimal obsessions Less anxious     Lab Review:     Component Value Date/Time   NA 138 06/27/2024 0937   NA 140 01/16/2023 0951   K 4.3 06/27/2024 0937   CL 100 06/27/2024 0937   CO2 32 06/27/2024 0937   GLUCOSE 148 (H) 06/27/2024 0937   BUN 25 (H) 06/27/2024 0937   BUN 21 01/16/2023 0951   CREATININE 0.83 06/27/2024 0937   CREATININE 0.57 08/25/2014 0836   CALCIUM  9.5 06/27/2024 0937   PROT 6.8 06/27/2024 0937   PROT 6.5 12/11/2020 1122   ALBUMIN 4.1 06/27/2024 0937   ALBUMIN 4.2 12/11/2020 1122   AST 24 06/27/2024 0937   ALT 17 06/27/2024 0937   ALKPHOS 98 06/27/2024 0937   BILITOT 0.9 06/27/2024 0937   BILITOT 0.7 12/11/2020 1122   GFRNONAA >60 06/27/2024 0937   GFRAA 95 12/11/2020 1122       Component Value Date/Time   WBC 16.6 (H) 06/27/2024 0937   RBC 5.23 (H) 06/27/2024 0937   HGB 15.6 (H) 06/27/2024 0937   HGB 15.0 01/16/2023 0951   HGB 13.1 06/05/2014 0920   HCT 49.0 (H) 06/27/2024 0937   HCT 47.4 (H) 01/16/2023 0951   HCT 41.1 06/05/2014 0920   PLT 266 06/27/2024 0937   PLT 341 01/16/2023 0951   MCV 93.7 06/27/2024 0937   MCV 89 01/16/2023 0951   MCV 89.2 06/05/2014 0920   MCH 29.8 06/27/2024 0937   MCHC 31.8 06/27/2024 0937   RDW 14.9 06/27/2024 0937   RDW 13.3  01/16/2023 0951   RDW 14.3 06/05/2014 0920   LYMPHSABS 1.5 06/27/2024 0937   LYMPHSABS 1.9 06/05/2014 0920   MONOABS 0.8 06/27/2024 0937   MONOABS 0.9 06/05/2014 0920   EOSABS 0.0 06/27/2024 0937   EOSABS 0.2 06/05/2014 0920   BASOSABS 0.0 06/27/2024 0937   BASOSABS 0.1 06/05/2014 0920    No results found for: POCLITH, LITHIUM   No results found for: PHENYTOIN, PHENOBARB, VALPROATE, CBMZ   .res Assessment: Plan:    Kolbie was seen today for follow-up and anxiety.  Diagnoses and all orders for this visit:  Generalized anxiety disorder  Mixed obsessional thoughts and acts -     FLUoxetine  (PROZAC ) 40 MG capsule; Take 2 capsules (80 mg total) by mouth daily. -     LORazepam  (ATIVAN ) 0.5 MG tablet; Take 1 tablet (0.5 mg total) by mouth 2 (two) times daily.  Seasonal depression -     FLUoxetine  (PROZAC ) 40 MG capsule; Take 2 capsules (80 mg total) by mouth daily.     30 min video face to face time with patient was spent on counseling and coordination of care.  We discussed OCD manageable.  Not much depression at present.  Specifically  better after increase fluoxetine  to high dose 80 mg daily.  Disc how this is often needed in pts with OCD  Tolerating meds.  Rare BZ. High relapse risk in OCD without SSRI .  She agrees to continue  Anxiety is much better with increase fluoxetine  and daily use of lorazepam  and counseling helps.  Continue fluoxetine  80 mg daily.   Better with higher dose. continue  lorazepam  0.5 mg AM  and prn  Consider buspirone or Abilify if worsening anxiety.  We discussed the short-term risks associated with benzodiazepines including sedation and increased fall risk among others.  Discussed long-term side effect risk including dependence, potential withdrawal symptoms, and the potential eventual dose-related risk of dementia.  But recent studies from 2020 dispute this association between benzodiazepines and dementia risk. Newer studies in 2020 do not support an association with dementia.  Extensive discussion of importance of CPAP for brain health and OSA noted.  She is compliant and benefiting.  Continued counseling.  H retired and a bit of adjustment for her.  Been about 18 mos now.  FU 4 mos  Lorene Macintosh, MD, DFAPA   Please see After Visit Summary for patient specific instructions.  Future Appointments  Date Time Provider Department Center  10/11/2024 10:30 AM Vannie Reche RAMAN, NP [facility] [phone] [facility]  10/18/2024  1:00 PM [facility]     No orders of the defined types were placed in this encounter.     -------------------------------

## 2024-10-02 ENCOUNTER — Other Ambulatory Visit (HOSPITAL_BASED_OUTPATIENT_CLINIC_OR_DEPARTMENT_OTHER): Payer: Self-pay | Admitting: Cardiovascular Disease

## 2024-10-02 DIAGNOSIS — Z124 Encounter for screening for malignant neoplasm of cervix: Secondary | ICD-10-CM | POA: Diagnosis not present

## 2024-10-02 DIAGNOSIS — Z01419 Encounter for gynecological examination (general) (routine) without abnormal findings: Secondary | ICD-10-CM | POA: Diagnosis not present

## 2024-10-02 DIAGNOSIS — Z1330 Encounter for screening examination for mental health and behavioral disorders, unspecified: Secondary | ICD-10-CM | POA: Diagnosis not present

## 2024-10-02 NOTE — Telephone Encounter (Signed)
 Prescription refill request for Eliquis  received. Indication: Last office visit:needs visit Scr: Age:  Weight: Prescription refilled

## 2024-10-08 DIAGNOSIS — F411 Generalized anxiety disorder: Secondary | ICD-10-CM | POA: Diagnosis not present

## 2024-10-11 ENCOUNTER — Encounter (HOSPITAL_BASED_OUTPATIENT_CLINIC_OR_DEPARTMENT_OTHER): Payer: Self-pay | Admitting: Family

## 2024-10-11 ENCOUNTER — Ambulatory Visit (HOSPITAL_BASED_OUTPATIENT_CLINIC_OR_DEPARTMENT_OTHER): Admitting: Family

## 2024-10-11 VITALS — BP 124/76 | HR 52 | Ht 69.0 in

## 2024-10-11 DIAGNOSIS — D6859 Other primary thrombophilia: Secondary | ICD-10-CM | POA: Diagnosis not present

## 2024-10-11 DIAGNOSIS — I10 Essential (primary) hypertension: Secondary | ICD-10-CM | POA: Diagnosis not present

## 2024-10-11 DIAGNOSIS — I350 Nonrheumatic aortic (valve) stenosis: Secondary | ICD-10-CM

## 2024-10-11 DIAGNOSIS — I4821 Permanent atrial fibrillation: Secondary | ICD-10-CM

## 2024-10-11 MED ORDER — METOPROLOL TARTRATE 50 MG PO TABS
50.0000 mg | ORAL_TABLET | Freq: Two times a day (BID) | ORAL | 3 refills | Status: AC
Start: 2024-10-11 — End: ?

## 2024-10-11 MED ORDER — TRIAMTERENE-HCTZ 37.5-25 MG PO TABS: 1.0000 | ORAL_TABLET | Freq: Every day | ORAL | 0 refills | Status: AC

## 2024-10-11 MED ORDER — APIXABAN 5 MG PO TABS: 5.0000 mg | ORAL_TABLET | Freq: Two times a day (BID) | ORAL | 0 refills | Status: DC

## 2024-10-11 NOTE — Progress Notes (Signed)
 Cardiology Office Note:  .   Date:  10/11/2024  ID:  [name], DOB 1966-03-15, MRN [id number] PCP: Katina Pfeiffer, PA-C  [facility] Providers Cardiologist:  Annabella Scarce, MD    History of Present Illness: .   [name] is a 58 y.o. female  hx of hypertension, OSA, aortic stenosis, morbid obesity, atrial fibrillation.  Echocardiogram 12/21/2022 with new finding of atrial fibrillation.  Her echocardiogram showed normal LVEF and stable mild aortic stenosis.  Seen in clinic 12/23/2022 with asymptomatic rate controlled atrial fibrillation.  Due to CHA2DS2-VASc of 2 she was started on Eliquis  5 mg twice daily.  [facility] home sleep study arranged.  CBC, BMP, TSH, magnesium unremarkable.  At follow-up 01/06/2023 she remained in atrial fibrillation and was set up for cardioversion which was performed 01/25/2023.  Her sleep study did reveal sleep apnea and she was recommended for therapeutic CPAP titration or if not approved to proceed with auto CPAP.  Seen 02/10/2023 with recurrent atrial fibrillation.  14-day monitor placed to assess burden.  She requested to treat sleep apnea, work on weight loss prior to EP referral.  Could consider rate control as she was overall asymptomatic.  ZIO monitor resulted with predominant atrial fibrillation with average heart rate of 70 bpm.  04/11/2023 she underwent diagnostic cystoscopy, l laser lithotripsy and bilateral ureteral stent placement.  Repeat preprocedure 04/06/2023 with right ureteral stent exchange and left stent removal.  Seen 08/25/23 in rate controlled atrial fibrillation which was asymptomatic. Encouraged to continue using CPAP.  She presents today for follow-up.  Family history notable for atrial fibrillation in both of her parents. She remains asymptomatic with afib; denies chest pain, shortness of breath, palpitations, lightheadedness, dizziness, edema. No bleeding complications on Eliquis . She reports this is $75/month and is  hopeful when her insurance changes in Jan 2026 the cost will decrease. She plans to start Zepbound  today, did not start previously as was costly, but will decrease in January with new insurance. She is looking forward to getting a new oven in time for Thanksgiving.   ROS: Please see the history of present illness.    All other systems reviewed and are negative.   Studies Reviewed: .         Cardiac Studies & Procedures   ______________________________________________________________________________________________     ECHOCARDIOGRAM  ECHOCARDIOGRAM COMPLETE 12/15/2023  Narrative ECHOCARDIOGRAM REPORT    Patient Name:   [name] Date of Exam: 12/15/2023 Medical Rec #:  [id number]          Height:       69.0 in Accession #:    [id number]         Weight:       417.5 lb Date of Birth:  December 11, 1965           BSA:          2.825 m Patient Age:    57 years           BP:           159/94 mmHg Patient Gender: F                  HR:           84 bpm. Exam Location:  Outpatient  Procedure: 2D Echo, 3D Echo, Cardiac Doppler and Color Doppler  Indications:    R42 Lightheaded  History:        Patient has prior history of Echocardiogram examinations, most recent 12/21/2022. Arrythmias:Atrial Fibrillation,  Signs/Symptoms:Dizziness/Lightheadedness; Risk Factors:Hypertension, Sleep Apnea and Non-Smoker. Patient denies chest pian, SOB and leg edema. She has noticed some dizziness the last couple days.  Sonographer:    Annabella Cater RVT, RDCS (AE), RDMS Referring Phys: [id number] RECHE GORMAN FINDER   Sonographer Comments: Patient is obese. Image acquisition challenging due to patient body habitus. IMPRESSIONS   1. Left ventricular ejection fraction, by estimation, is 70 to 75%. The left ventricle has hyperdynamic function. The left ventricle has no regional wall motion abnormalities. There is mild concentric left ventricular hypertrophy. Left ventricular diastolic parameters were  normal. 2. Right ventricular systolic function is normal. The right ventricular size is mildly enlarged. 3. Left atrial size was mildly dilated. 4. Right atrial size was mildly dilated. 5. Mild mitral valve regurgitation. Moderate mitral annular calcification. 6. AV is thickened, calcified Difficult to see leaflets well Peak and mean gradients through the valve are 27 and 17 mm Hg respectively AVA (VTI) is 1.33 cm2. Dimensionless index is 0.54 Overall consistent with mild AS. Compared to previous echo, mean gradient is unchanged . Aortic valve regurgitation is not visualized. 7. The inferior vena cava is normal in size with greater than 50% respiratory variability, suggesting right atrial pressure of 3 mmHg.  Comparison(s): The left ventricular function is unchanged. Ef 60%, moderate LVH, severe MAC, mild AS mean 10 peak 17.5.  FINDINGS Left Ventricle: Left ventricular ejection fraction, by estimation, is 70 to 75%. The left ventricle has hyperdynamic function. The left ventricle has no regional wall motion abnormalities. The left ventricular internal cavity size was normal in size. There is mild concentric left ventricular hypertrophy. Left ventricular diastolic parameters were normal.  Right Ventricle: The right ventricular size is mildly enlarged. Right vetricular wall thickness was not assessed. Right ventricular systolic function is normal.  Left Atrium: Left atrial size was mildly dilated.  Right Atrium: Right atrial size was mildly dilated.  Pericardium: There is no evidence of pericardial effusion.  Mitral Valve: There is mild thickening of the mitral valve leaflet(s). Moderate mitral annular calcification. Mild mitral valve regurgitation.  Tricuspid Valve: The tricuspid valve is normal in structure. Tricuspid valve regurgitation is trivial.  Aortic Valve: AV is thickened, calcified Difficult to see leaflets well Peak and mean gradients through the valve are 27 and 17 mm Hg  respectively AVA (VTI) is 1.33 cm2. Dimensionless index is 0.54 Overall consistent with mild AS. Compared to previous echo, mean gradient is unchanged. Aortic valve regurgitation is not visualized. Aortic valve mean gradient measures 12.7 mmHg. Aortic valve peak gradient measures 23.1 mmHg. Aortic valve area, by VTI measures 1.53 cm.  Pulmonic Valve: The pulmonic valve was grossly normal. Pulmonic valve regurgitation is not visualized.  Aorta: The aortic root and ascending aorta are structurally normal, with no evidence of dilitation.  Venous: The inferior vena cava is normal in size with greater than 50% respiratory variability, suggesting right atrial pressure of 3 mmHg.  IAS/Shunts: No atrial level shunt detected by color flow Doppler.   LEFT VENTRICLE PLAX 2D LVIDd:         4.15 cm   Diastology LVIDs:         2.40 cm   LV e' medial:    10.00 cm/s LV PW:         1.55 cm   LV E/e' medial:  17.0 LV IVS:        1.32 cm   LV e' lateral:   10.90 cm/s LVOT diam:     1.90 cm  LV E/e' lateral: 15.6 LV SV:         77 LV SV Index:   27 LVOT Area:     2.84 cm  3D Volume EF: 3D EF:        54 % LV EDV:       76 ml LV ESV:       35 ml LV SV:        41 ml  RIGHT VENTRICLE RV S prime:     13.40 cm/s TAPSE (M-mode): 2.5 cm  LEFT ATRIUM              Index        RIGHT ATRIUM           Index LA diam:        4.60 cm  1.63 cm/m   RA Area:     26.40 cm LA Vol (A2C):   93.1 ml  32.96 ml/m  RA Volume:   93.80 ml  33.21 ml/m LA Vol (A4C):   118.0 ml 41.77 ml/m LA Biplane Vol: 105.0 ml 37.17 ml/m AORTIC VALVE                     PULMONIC VALVE AV Area (Vmax):    1.40 cm      PV Vmax:       1.08 m/s AV Area (Vmean):   1.33 cm      PV Peak grad:  4.6 mmHg AV Area (VTI):     1.53 cm AV Vmax:           240.33 cm/s AV Vmean:          166.000 cm/s AV VTI:            0.502 m AV Peak Grad:      23.1 mmHg AV Mean Grad:      12.7 mmHg LVOT Vmax:         119.00 cm/s LVOT Vmean:         77.800 cm/s LVOT VTI:          0.271 m LVOT/AV VTI ratio: 0.54  AORTA Ao Root diam: 2.50 cm Ao Asc diam:  3.20 cm Ao Arch diam: 2.9 cm  MITRAL VALVE                TRICUSPID VALVE MV Area (PHT): 4.19 cm     TR Peak grad:   34.6 mmHg MV Decel Time: 181 msec     TR Vmax:        294.00 cm/s MR Peak grad: 72.6 mmHg MR Vmax:      426.00 cm/s   SHUNTS MV E velocity: 170.00 cm/s  Systemic VTI:  0.27 m Systemic Diam: 1.90 cm  Vina Gull MD Electronically signed by Vina Gull MD Signature Date/Time: 12/15/2023/4:02:08 PM    Final    MONITORS  LONG TERM MONITOR (3-14 DAYS) 03/03/2023  Narrative 14 Day Zio Monitor  Quality: Fair.  Baseline artifact. Predominant rhythm: atrial fibrillation Average heart rate: 78 bpm Max heart rate: 165 bpm Min heart rate: 51 bpm Pauses >2.5 seconds: none  Rare PVCs (<1%)   Tiffany C. Raford, MD, [facility] 04/27/2023 3:25 PM       ______________________________________________________________________________________________       Risk Assessment/Calculations:    CHA2DS2-VASc Score = 2   This indicates a 2.2% annual risk of stroke. The patient's score is based upon:  STOP-Bang Score:         Physical Exam:   VS:  BP 124/76 (BP Location: Right Arm, Patient Position: Sitting) Comment (Cuff Size): XL thigh cuff  Pulse (!) 52   Ht 5' 9 (1.753 m)   SpO2 94%   BMI 59.37 kg/m    Wt Readings from Last 3 Encounters:  04/09/24 (!) 182.3 kg  08/25/23 (!) 189.4 kg  06/16/23 (!) 181.3 kg    GEN: Well nourished, overweight, well developed in no acute distress NECK: No JVD; No carotid bruits CARDIAC: IRIR, no murmurs, rubs, gallops; radial and PT pulses 2+ bilaterally RESPIRATORY:  Clear to auscultation without rales, wheezing or rhonchi  ABDOMEN: Soft, non-tender, non-distended EXTREMITIES:  No edema; No deformity   ASSESSMENT AND PLAN: .    Atrial fibrillation/hypercoagulable state - status post DCCV 01/25/23 at  which time maintained NSR <2 weeks. Atrial fib in both her mom and dad. Monitor 02/2023 with 100% atrial fibrillation burden with rate controlled. Remains asymptomatic and rate controlled on metoprolol  tartrate 50mg  BID. No bleeding complications on eliquis  5mg  BID. Labs 05/2024: Hgb 15.6, creat 0.83 Continue therapy as above CHA2DS2-VASc Score = 2 [CHF History: 0, HTN History: 1, Diabetes History: 0, Stroke History: 0, Vascular Disease History: 0, Age Score: 0, Gender Score: 1].  Therefore, the patient's annual risk of stroke is 2.2 %. Provided copay card for Eliquis ; advised to contact office if cost-prohibitive  CBC and BMET due Jan 2026  OSA - follows with Dr. Shlomo, due for appointment as of 05/2024  Obesity - plans to initiate Zepbound  therapy with PCP Weight loss via diet and exercise encouraged. Discussed the impact being overweight would have on cardiovascular risk.   HTN, goal <130/80 - controlled by in office reading, on triamterene -hydrochlorothiazide 37.5/25mg  daily; labs 05/2024: creatinine 0.83, K+ 4.3 Continue therapy as above Follow low salt, sodium diet  BMET to be completed Jan 2026  Aortic stenosis - mild by echo 11/2023; due for repeat 11/2024; denies shortness of breath, chest pain, syncope, fatigue Echocardiogram ordered for Jan 2026 If stable, plan to repeat in 2-3 years       Dispo: 12 months  Signed, Reche Finder, NP

## 2024-10-11 NOTE — Patient Instructions (Signed)
 Medication Instructions:   Your physician recommends that you continue on your current medications as directed. Please refer to the Current Medication list given to you today.  *If you need a refill on your cardiac medications before your next appointment, please call your pharmacy*  Lab Work:  RETURN FOR LAB WORK ON SAME DAY YOUR ECHO GETS SCHEDULED IN [place] 2026--BMET AND CBC  If you have labs (blood work) drawn today and your tests are completely normal, you will receive your results only by: MyChart Message (if you have MyChart) OR A paper copy in the mail If you have any lab test that is abnormal or we need to change your treatment, we will call you to review the results.   Testing/Procedures:  Your physician has requested that you have an echocardiogram. Echocardiography is a painless test that uses sound waves to create images of your heart. It provides your doctor with information about the size and shape of your heart and how well your heart's chambers and valves are working. This procedure takes approximately one hour. There are no restrictions for this procedure.  PLEASE SCHEDULE ECHO TO BE DONE IN JANUARY 2026--PLEASE HAVE YOUR LAB WORK DONE SAME DAY  Please do NOT wear cologne, perfume, aftershave, or lotions (deodorant is allowed). Please arrive 15 minutes prior to your appointment time.  Please note: We ask at that you not bring children with you during ultrasound (echo/ vascular) testing. Due to room size and safety concerns, children are not allowed in the ultrasound rooms during exams. Our front office staff cannot provide observation of children in our lobby area while testing is being conducted. An adult accompanying a patient to their appointment will only be allowed in the ultrasound room at the discretion of the ultrasound technician under special circumstances. We apologize for any inconvenience.   Follow-Up: At [facility], you and your health needs are  our priority.  As part of our continuing mission to provide you with exceptional heart care, our providers are all part of one team.  This team includes your primary Cardiologist (physician) and Advanced Practice Providers or APPs (Physician Assistants and Nurse Practitioners) who all work together to provide you with the care you need, when you need it.  Your next appointment:   1 year(s)  Provider:   Annabella Scarce, MD or Reche Finder, NP

## 2024-10-15 DIAGNOSIS — F411 Generalized anxiety disorder: Secondary | ICD-10-CM | POA: Diagnosis not present

## 2024-10-18 ENCOUNTER — Ambulatory Visit

## 2024-10-22 DIAGNOSIS — F411 Generalized anxiety disorder: Secondary | ICD-10-CM | POA: Diagnosis not present

## 2024-11-08 DIAGNOSIS — F411 Generalized anxiety disorder: Secondary | ICD-10-CM | POA: Diagnosis not present

## 2024-11-15 ENCOUNTER — Inpatient Hospital Stay
Admission: RE | Admit: 2024-11-15 | Discharge: 2024-11-15 | Attending: Obstetrics and Gynecology | Admitting: Obstetrics and Gynecology

## 2024-11-15 DIAGNOSIS — Z1231 Encounter for screening mammogram for malignant neoplasm of breast: Secondary | ICD-10-CM | POA: Diagnosis not present

## 2024-11-18 DIAGNOSIS — F411 Generalized anxiety disorder: Secondary | ICD-10-CM | POA: Diagnosis not present

## 2024-11-25 DIAGNOSIS — F411 Generalized anxiety disorder: Secondary | ICD-10-CM | POA: Diagnosis not present

## 2024-12-01 ENCOUNTER — Other Ambulatory Visit (HOSPITAL_BASED_OUTPATIENT_CLINIC_OR_DEPARTMENT_OTHER): Payer: Self-pay | Admitting: Family

## 2024-12-01 DIAGNOSIS — I4891 Unspecified atrial fibrillation: Secondary | ICD-10-CM

## 2024-12-01 DIAGNOSIS — D6859 Other primary thrombophilia: Secondary | ICD-10-CM

## 2024-12-02 ENCOUNTER — Encounter: Payer: Self-pay | Admitting: Internal Medicine

## 2024-12-02 NOTE — Telephone Encounter (Signed)
 Eliquis  5mg  refill request received. Patient is 59 years old, weight-182.3kg, Crea-0.83 on 06/27/24, Diagnosis-Afib, and last seen by Reche Finder on 10/11/24. Dose is appropriate based on dosing criteria. Will send in refill to requested pharmacy.

## 2024-12-05 ENCOUNTER — Encounter: Payer: Self-pay | Admitting: Internal Medicine

## 2024-12-06 ENCOUNTER — Ambulatory Visit (INDEPENDENT_AMBULATORY_CARE_PROVIDER_SITE_OTHER)

## 2024-12-06 DIAGNOSIS — I350 Nonrheumatic aortic (valve) stenosis: Secondary | ICD-10-CM | POA: Diagnosis not present

## 2024-12-06 LAB — ECHOCARDIOGRAM COMPLETE
AR max vel: 1.5 cm2
AV Area VTI: 1.8 cm2
AV Area mean vel: 1.62 cm2
AV Mean grad: 16 mmHg
AV Peak grad: 30.9 mmHg
Ao pk vel: 2.78 m/s
Area-P 1/2: 4.01 cm2
Est EF: >75
MV VTI: 2.43 cm2
S' Lateral: 2.11 cm

## 2024-12-07 LAB — BASIC METABOLIC PANEL WITH GFR
BUN/Creatinine Ratio: 28 — ABNORMAL HIGH (ref 9–23)
BUN: 23 mg/dL (ref 6–24)
CO2: 23 mmol/L (ref 20–29)
Calcium: 9.2 mg/dL (ref 8.7–10.2)
Chloride: 100 mmol/L (ref 96–106)
Creatinine, Ser: 0.83 mg/dL (ref 0.57–1.00)
Glucose: 71 mg/dL (ref 70–99)
Potassium: 4 mmol/L (ref 3.5–5.2)
Sodium: 141 mmol/L (ref 134–144)
eGFR: 81 mL/min/1.73

## 2024-12-07 LAB — CBC
Hematocrit: 47.8 % — ABNORMAL HIGH (ref 34.0–46.6)
Hemoglobin: 14.8 g/dL (ref 11.1–15.9)
MCH: 29.2 pg (ref 26.6–33.0)
MCHC: 31 g/dL — ABNORMAL LOW (ref 31.5–35.7)
MCV: 94 fL (ref 79–97)
Platelets: 249 x10E3/uL (ref 150–450)
RBC: 5.07 x10E6/uL (ref 3.77–5.28)
RDW: 13.5 % (ref 11.7–15.4)
WBC: 9.9 x10E3/uL (ref 3.4–10.8)

## 2024-12-08 ENCOUNTER — Ambulatory Visit (HOSPITAL_BASED_OUTPATIENT_CLINIC_OR_DEPARTMENT_OTHER): Payer: Self-pay | Admitting: Family

## 2024-12-09 ENCOUNTER — Other Ambulatory Visit (HOSPITAL_BASED_OUTPATIENT_CLINIC_OR_DEPARTMENT_OTHER): Payer: Self-pay | Admitting: *Deleted

## 2024-12-09 DIAGNOSIS — I350 Nonrheumatic aortic (valve) stenosis: Secondary | ICD-10-CM

## 2024-12-09 NOTE — Telephone Encounter (Signed)
 Triage team - please update pt that no appointment needed. Likely old recall. Needs recall for 09/2025.  [name] S Juanda Luba, NP

## 2024-12-24 ENCOUNTER — Encounter: Payer: Self-pay | Admitting: Internal Medicine

## 2025-01-13 ENCOUNTER — Telehealth: Admitting: Psychiatry
